# Patient Record
Sex: Male | Born: 1950 | Race: Black or African American | Hispanic: No | Smoking: Former smoker
Health system: Southern US, Community
[De-identification: ages and names within clinical notes are randomized; demographics above are authoritative.]

## PROBLEM LIST (undated history)

## (undated) DIAGNOSIS — N400 Enlarged prostate without lower urinary tract symptoms: Secondary | ICD-10-CM

## (undated) DIAGNOSIS — Z789 Other specified health status: Secondary | ICD-10-CM

## (undated) DIAGNOSIS — M1990 Unspecified osteoarthritis, unspecified site: Secondary | ICD-10-CM

## (undated) DIAGNOSIS — J189 Pneumonia, unspecified organism: Secondary | ICD-10-CM

## (undated) DIAGNOSIS — F32A Depression, unspecified: Secondary | ICD-10-CM

## (undated) DIAGNOSIS — I2510 Atherosclerotic heart disease of native coronary artery without angina pectoris: Secondary | ICD-10-CM

## (undated) DIAGNOSIS — I6522 Occlusion and stenosis of left carotid artery: Secondary | ICD-10-CM

## (undated) DIAGNOSIS — I219 Acute myocardial infarction, unspecified: Secondary | ICD-10-CM

## (undated) DIAGNOSIS — R7303 Prediabetes: Secondary | ICD-10-CM

## (undated) DIAGNOSIS — E119 Type 2 diabetes mellitus without complications: Secondary | ICD-10-CM

## (undated) DIAGNOSIS — C801 Malignant (primary) neoplasm, unspecified: Secondary | ICD-10-CM

## (undated) HISTORY — DX: Other specified health status: Z78.9

## (undated) HISTORY — PX: TONSILLECTOMY: SUR1361

## (undated) HISTORY — PX: APPENDECTOMY: SHX54

## (undated) HISTORY — PX: COLONOSCOPY: SHX174

## (undated) HISTORY — DX: Atherosclerotic heart disease of native coronary artery without angina pectoris: I25.10

## (undated) HISTORY — DX: Acute myocardial infarction, unspecified: I21.9

## (undated) HISTORY — DX: Occlusion and stenosis of left carotid artery: I65.22

## (undated) HISTORY — PX: CATARACT EXTRACTION: SUR2

## (undated) NOTE — Progress Notes (Signed)
 Formatting of this note is different from the original. Clinical Progress Note:   Chief complaint: CHEST PAIN  HPI: John Spencer is a 6 YO male here for Ongoing left and mid chest discomfort. Associated with left hand tingling. Sometimes together, sometimes not.  Baker. movng the flour exacerbated it so not sure if muscle related. This pain is not associated with exercise or food.  Played tennis last Saturday but not since (for a week). Energy level diminished from the past. No chest pain while playing. No assd SOB. No dizziness or sweating.  hx recent cath w stent placement, neg treadmill stress (at submax HR), and normal Echo all in past few months. Taking anti-platelets diligently  Not clearly food related though has not paid attention. Does drink at least one coffee a day and eat a lot of tomatoes and onions. Some stress. When delving further describes stress level as 'very high.' Ref to AOQ at end of this note. PHQ 16 including suicidal thoughts though denies plan to act on this. Hx severe depression and has gone through multiple treatments and now off meds.  Review of Systems:  Constitutional:  negative for fever, negative for chills or recent illness Genitourinary: negative for dysuria, negative for frequency and negative for urgency  Patient Active Problem List:    URTICARIA   ROSACEA   LOWER URINARY TRACT SYMPTOMS (LUTS)   MAJOR DEPRESSION, RECURRENT   INTERMITTENT ASTHMA   PROSTATE CANCER   HYPERLIPIDEMIA   IRRITABLE BOWEL SYNDROME   ATHEROSCLEROSIS OF AORTA   SHOULDER INTERNAL IMPINGEMENT   CAD, PRESENCE OF STENT  No past surgical history on file.  Allergies: Review of patient's allergies indicates no known allergies.  I reviewed the medication list.  Outpatient Prescriptions Marked as Taking for the 06/05/13 encounter (Office Visit) with Ellard Never (M.D.)  Medication Sig Dispense Refill  ? Atorvastatin (LIPITOR) 80 mg Oral Tab Take 1 tablet orally daily for  cholesterol  90  3  ? Aspirin  (ASPIR-LOW) 81 mg Oral TBEC DR Tab Take 1 tablet orally daily for stent protection and heart disease Prescribed by: Traci Marcrum  120  1  ? Clopidogrel (PLAVIX) 75 mg Oral Tab Take 1 tablet orally daily for stent protection Prescribed by: Traci Marcrum  90  5  ? Metoprolol Tartrate (LOPRESSOR) 25 mg Oral Tab Take 1/2 tablet orally 2 times a day for blood pressure and for heart disease Prescribed by: Wilbert Marcrum  100  5   I have reviewed John Spencer: medical history with no changes  06/05/2013 and social history with no changes  06/05/2013   Physical Examination:  BP 130/64  Pulse 59  Temp(Src) 98.5 F (36.9 C) (Temporal)  Ht 5' 11  Wt 173 lb 1.6 oz (78.518 kg)  BMI 24.15 kg/m2  SpO2 98%  General appearance -  vital signs reviewed and alert, well appearing, and in no distress ENT -  mouth mucous membranes moist Neck - supple, no jvd Respiratory - clear to auscultation, no wheezes, rales or rhonchi Cardiovascular -  normal rate and regular rhythm, extra beats, normal S1, S2 Extremities - peripheral pulses normal, no pedal edema Psych -intermittently tearful  Data Reviewed: Reviewed lab results:  Reviewed radiology results: cards procedures  Reviewed other records: chart notes   EKG: unchanged NSR frequent Pacs, e/o old anteroseptal infarct Assessment & Plan:  CHEST PAIN  (primary encounter diagnosis) Doubting cardiac given all the workup that has been done recently and no association with activity but will check ekg  esp to r/o concerns of in stent thrombosis relating to current symptoms though doubtful with this subacute presentation.  More likely psych related CP based on my single visit w him but will also get in touch w cardiology to review and see if any further management indicated. F/u w PCP  CAD, PRESENCE OF STENT Plan: ELECTROCARDIOGRAM, ROUTINE, W AT LEAST 12 LEADS, INTERP AND RPT Continue meds including 2 antiplatelet  MAJOR DEPRESSION,  RECURRENT Psych re-referral placed in hopes of getting earlier appointment. phq 16 Er precautions given Support provided Does not want to resume meds.   FOLLOW UP:PCP  A translator was not needed or used during this encounter  The plan was formulated with the patient who agreed to the plan. The patient was given the opportunity to ask questions and have concerns addressed. The patient has been encouraged to contact me if further questions/concerns arise.   The patient was instructed to contact us , return, seek medical care upon an emergency basis as needed if symptoms fail to improve, worsen, or new/concerning symptoms appear.  Malvin Bonito, MD Primary Care Physician Department of Internal Medicine  ============================================= Adult Outcomes Questionnaire Results  1. Little interest or pleasure in doing things: 0 2. Feeling down, depressed, or hopeless: 3 3. Trouble falling or staying asleep, or sleeping too much: 3 4. Feeling tired or having little energy: 2 5. Poor appetite or overeating: 0 6. Feeling bad about yourself - or that you are a failure or have let yourself or your family down: 3 7. Trouble concentrating on things, such as reading the newspaper or watching television: 2 8. Moving or speaking so slowly that other people could have noticed. Or the opposite - being so fidgety or restless that you have been moving around a lot more than usual: 0 9. Thoughts that you would be better off dead, or of hurting yourself in some way: 3  PHQ9 subscale score: 16 Depression severity:  Moderately severe  Electronically signed by Bonito Malvin (M.D.) at 06/05/2013 10:14 PM PDT

## (undated) NOTE — ED Provider Notes (Signed)
 Formatting of this note is different from the original. Emergency Department Note Date: 03/17/2014 Patient Encounter Initiated at 12:18 PM  Chief Complaint:  Chief Complaint  Patient presents with  ? DIZZY    Pt reports approx 20 minutes ago felling like I was drunk. Pt reports + nausea, SOB, lightheaded with slight chest discomfort.  Denies HA, vomiting at this time.    Per my review of available medical records:  Had ETT 03/13/14: Exercise Treadmill Test:  Risks, benefits, and alternatives to the procedure were discussed with the patient and/or legal representative. All questions were answered. The patient and/or legal representative agreed to proceed with the procedure, and informed consent was signed and is in patient's chart.  DAKARRI KESSINGER is a 28 Y male, who exercised 7:01 mins, on 2 minute stages of Bruce treadmill protocol.  Attained 12.8 METs, with good functional capacity for age. Max HR 143 ( 89% MPHR for age.) Stopped for max effort. No c/p, ST abnormality, arrhythmia except intermitt PVCs.   Impression: ETT low risk for ischemia with good workload. Recommend: contin CAD Rx and CAD risk factor modification, including avoid all tobacco.  MD notified. F/U MD as indicated, and prn further symptoms. JONELLE Fetch, MD  Hx per patient, a good  historian.  HPI: JARREL KNOKE is a 17 Y male with hx CAD, HLD, Depression, Prostate CA, now with c/o lighhteadedness this am. Pt states he has been feeling well, today [place] to [employer], got out of car and was walking to escalator and felt very lightheaded, had to hold himself up at top. Still feels lightheaded lying in gurney. No headache. NO chest pain before arrival, but had brief chest pressure while in triage lasting minutes.  No sob. + nausea, no vomiting.  No headache. NO focal weakness.  Denies vertigo, felt more lightheaded.   Has been off all meds (intentionally, is taking natural equivalents) x months.       All of the [facility]SE,  nurses notes, vital signs, medications, & allergies have been reviewed  All: Review of patient's allergies indicates no known allergies.  PMH: Active Ambulatory Problems    Diagnosis Date Noted  ? URTICARIA 01/24/2005  ? ROSACEA 09/22/2007  ? LOWER URINARY TRACT SYMPTOMS (LUTS) 10/27/2007  ? MAJOR DEPRESSION, RECURRENT 01/04/2008  ? INTERMITTENT ASTHMA 02/05/2009  ? PROSTATE CANCER 06/19/2009  ? HYPERLIPIDEMIA 10/21/2009  ? IRRITABLE BOWEL SYNDROME 06/25/2011  ? ATHEROSCLEROSIS OF AORTA 06/25/2011  ? SHOULDER INTERNAL IMPINGEMENT 07/13/2011  ? CAD, PRESENCE OF STENT 01/18/2013   No Additional Past Medical History   PSH: No past surgical history on file.  Social History: History   Social History  ? Marital Status: Divorced    Spouse Name: N/A    Number of Children: N/A  ? 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 Smoking status: Former Smoker -- 0.50 packs/day for 2 years    Types: Cigarettes    Quit date: 03/17/1976  ? Smokeless tobacco: Never Used     Comment: smokes MJ several times a week  ? Alcohol Use: No  ? Drug Use: Yes  ? Sexual Activity:    Partners: Female     Comment: marijuana   Other Topics Concern  ? Not on file   Social History Narrative   Living arrangements - the patient lives alone.     Review of Systems  Constitutional: (-) fever, (-)chills.  Skin: (-)rash.  HENT:(-)headaches, (-)  ear pain, (-) congestion.   Eyes:(-) eye pain , (-) eye redness.  Cardiovascular:(+) chest pain. (-) orthopnea, (-) paroxysmal nocturnal dyspnea (-) diaphoresis Respiratory:(-) cough , (-) shortness of breath. Gastrointestinal:(+) nausea , (-) vomiting.  Genitourinary:(-) dysuria, (-) urgency, (-) frequency, (-) hematuria.  Neurological:(-) sensory change, (-) focal weakness, (-) loss of consciousness.  All other systems reviewed and are negative.  Physical Exam BP 145/72 mmHg  Pulse 83  Temp(Src) 98.7 F  (37.1 C)  Resp 18  SpO2 98% Vital signs: reviewed,  General: vital signs reviewed and alert, well appearing, and in no distress Head: Normocephalic and atraumatic.  Nose: No nasal deformity. No epistaxis.  Mouth/Throat: Uvula is midline, oropharynx is clear and mucous membranes are normal.  Eyes: Conjunctivae are normal. Pupils are equal, round, and reactive to light.  Neck: No tracheal deviation present. No thyromegaly present.  Cardiovascular:  Normal rate and regular rhythm.  Exam reveals no friction rub.   No murmur heard. Pulmonary/Chest: Effort normal and breath sounds equal bilateral. No wheezing or crackles Abdominal: Soft. No abdominal distension. No abdo tenderness on exam.. There is no splenomegaly or hepatomegaly.  Musculoskeletal: No LE edema, no calf tenderness.  Neurological exam: CN2-12 intact and equal bilaterally. No pronator drift. Nl finger to nose. Strength 5/5 in UE and LE.  Alert and oriented to person, place, and time. Moving all extremities, sensation intact to light touch over all extremities.   Skin: Skin is warm and dry.  Psychiatric:  Normal mood and affect. Judgment normal  Medical Decision Making: 46 M hx CAD, HLD, Depression, Prostate CA, now with c/o lighhteadedness this am. Pt well appearing on exam. Ddx includes cardiac etiology given hx though recent ETT neg and cp only lasted minutes v orthostatic though no recent position change. Pt may be mildly dehydrated. Will r/o UTI. Considered vasovagal.   Initial plan and interventions: labs, d/w cards  Selected Results: All lab tests reviewed Chem nl Trop neg (given low suspicion, will not cycle) Cbc neg Urinalysis neg  EKG: (my reading) normal sinus rhythm, Rate- 75 bpm, QRS- Normal, occ pVC, ST/T segments- Normal. This EKG is similar to prior EKG dated 03/13/14.SABRA EKG #2: + pVC otherwise unchanged.   CXR: neg per radiology.   ED Course:  2:02 PM D/w Dr. Berenice, cards. Pt should be off beta  blocker, has resting HR. Recs: -min asa, statin No need for further testing at this time.   D/w pt above recs and he agrees. Will restart ASA, and statin. Pt taking other natural meds, will f/u with PCP when he has list to review for possible side effects.  S/p NS 1L Able to ambulate in ED w/o difficulty.   Patient Vitals for the past 4 hrs:  Temp Temp Source Pulse BP Resp SpO2 O2 Delivery  03/17/14 1515 - - 57 132/57 mmHg 19 97 % RA-ROOM AIR  03/17/14 1453 - - 61 126/61 mmHg 16 98 % RA-ROOM AIR  03/17/14 1338 - - 80 130/61 mmHg 13 98 % RA-ROOM AIR  03/17/14 1245 - - 66 146/63 mmHg 15 96 % RA-ROOM AIR  03/17/14 1209 98.7 F (37.1 C) ORAL 83 145/72 mmHg 18 98 % RA-ROOM AIR   Impression and Plan:  Lightheadedness (Unclear exact etiology) -f/u PMD in 2 days for re-check and to review meds.   Final disposition: Home with Friend.  Condition at Discharge: Good  Is this patient being admitted with a possible source of infection: No  Electronically signed by Sadoun, Tania (M.D.) at 03/17/2014  3:41 PM PST

## (undated) NOTE — Progress Notes (Signed)
 Formatting of this note might be different from the original.        A lot of baseline artifact but no evidence of ischemia at 12.8 mets Electronically signed by Lenon Jerel Bradley (M.D.) at 02/11/2009 12:33 PM PST

## (undated) NOTE — Progress Notes (Signed)
 Formatting of this note is different from the original. Chief Complaint: ABNORMAL TREADMILL  PI:John Spencer is a 70 Y male  Reason for Visit: Referred from exercise treadmill test for an abnormal test earlier today, following episodes of chest discomfort at home.   Investigations: 12/15/12 exercise treadmill test - symptomatically positive   Procedures: None   Admissions: None   History of the Present Illness: 12/15/12  Per resident Dr. Amado Bumpers:  Has been having off/on lightheadedness x several months. Two days ago, was very lightheaded after waking up. Seen in clinic on 12/01/12 with chest discomfort. Felt a sharp jab over left chest during activities (lifting things, picking up something), one time pushed hands against each other and felt the sharp pain. Would go away when stop activities. No trauma. EKG was done showing q waves in V2-V3. Prescribed nitroglycerin, but has not needed to use it. Since visit, no more such pain. Was told in the past in [place] that he has had a heart attack (8-9 years ago).   Baseline: plays tennis 1-3x a week, but not consistently the last few months due to low energy. The last tennis session was <1 week ago, felt fatigue but no chest discomfort, slight lightheadedness.  Here for EKG stress test today. Feeling fine, just tired, prior to test. A few minutes into the test, felt chest heaviness and lightheadedness. Not the kind of chest pain 2 weeks ago.  Nonsmoker, smokes marijuana (every day). No alcohol. Father had three heart attacks, started in his 65s. Older brother had stents placed in his early 13s.   ROS: no fevers or chills; no shortness of breath, LLQ abdominal pain has been going on for months (has been worked up), no LE edema, no orthopnea.  My note: History reviewed and I agree with findings.   Major Comorbidities:IBS, prostate cancer  BP Readings from Last 3 Encounters:  12/15/12 129/69  12/05/12 111/58  12/01/12 120/41   Pulse  Readings from Last 3 Encounters:  12/15/12 63  12/05/12 63  12/01/12 56   Wt Readings from Last 3 Encounters:  12/15/12 168 lb (76.204 kg)  12/05/12 170 lb 12.8 oz (77.474 kg)  12/01/12 170 lb 3.2 oz (77.202 kg)   Basename Value Date/Time  ? CHOLESTEROL       188 08/24/2012  ? TRIGLYCERIDE         90 10/17/2007  ? HDL                46 08/24/2012  ? LDL CALCULATED      116 10/17/2007   Basename Value Date/Time  ? CREATININE   0.93 12/02/2012   Basename Value Date/Time  ? AST     14 12/02/2012   Basename Value Date/Time  ? WBC COUNT         7.0 12/02/2012  ? HGB              14.6 12/02/2012  ? HEMATOCRIT       44.0 12/02/2012  ? PLATELET COUNT    277 12/02/2012   Past medical History:  Patient Active Problem List:    URTICARIA   ROSACEA   LOWER URINARY TRACT SYMPTOMS (LUTS)   DEPRESSION, MAJOR, RECURRENT   ASTHMA, INTERMITTENT   PROSTATE CANCER   HYPERLIPIDEMIA   IRRITABLE BOWEL SYNDROME   ATHEROSCLEROSIS AORTA   SHOULDER INTERNAL IMPINGEMENT  Medications: Outpatient Prescriptions Marked as Taking for the 12/15/12 encounter (Office Visit) with Berenice Redell Karst (M.D.)  Medication Sig Dispense Refill  ?  Omeprazole Magnesium 20 mg Oral CPDR SR Cap Take 1 capsule orally daily 30 minutes before breakfast  28  2  ? Ondansetron  (ZOFRAN  ODT) 4 mg Oral Rap Dis Tab Take 1 to 2 tablets orally every 6 hours when needed for vomiting or abdominal cramps  30  0   No Facility-Administered Medications for the 12/15/12 encounter (Office Visit) with Berenice Redell Karst (M.D.).   Allergies: No Known Allergies  Social History: History  Substance Use Topics  ? Smoking status: Former Smoker -- 0.50 packs/day for 2 years    Types: Cigarettes    Quit date: 03/17/1976  ? Smokeless tobacco: Never Used  ? Alcohol Use: None   Family History: Father with MIs x3, brother with stents in his 57s  Review of Systems: Constitutional: No recent chills, fevers, weight loss Eyes: No  recent redness Ears Nose Throat: No recent sore throat Cardiovascular: Denies syncope, presyncope, palpitations, orthopnea or PND Respiratory: Denies cough, wheeze or sputum Gastrointestinal: denies anorexia, vomiting, dysphagia, heartburn, recent constipation or diarrhea, blood in bowel motions  Genitourinary: Denies dysuria, hematuria,  Musculoskeletal: Denies diffuse myalgia, focal joint swelling or inflammation.  Skin:Denies recent skin rash Neurological: Denies recent headache, focal arm or leg weakness, diplopia, dysarthria Psychiatric: Denies depressed mood   Physical Exam: BP 129/69  Pulse 63  Temp(Src) 98.8 F (37.1 C) (Oral)  Ht 5' 11  Wt 168 lb (76.204 kg)  BMI 23.44 kg/m2  SpO2 98% Standing BP: left arm 120/70 mm Hg regular; right arm 122/68 mm Hg; regular cuff Const: clear History            Resp: Clear to percussion and auscultation   CV: Normal apex beat. Normal S1, S2. No S3, S4. No murmurs. Normal JVP. Normal carotids. No peripheral edema. Normal foot pulses. No femoral, carotid or abdominal aortic bruits.   Chest: no deformities  GI: No masses, tenderness, bruits  Data Review: Reviewed exercise treadmill test from today  Assessment / Plan:  Atypical chest pain.  His recent sharp, brief, focal left anterior thoracic chest pain was musculoskeletal in character. We have reassured him about this pain and that he does not need to come to Emergency Department for this. However, his chest discomfort during exercise treadmill test today was much more typical of angina, onset within 4 minutes of starting accelerated Bruce protocol, and associated with an increase in venticular ectopics . We are concerned that this represents new onset angina with intermediate risk of future cardiac event and have therefore arranged cardiac catheterization, started aspirin  and atorvastatin, nitroglycerin prn, and cautioned him to come to Emergency Department for more than 10 minutes of this  kind of chest discomfort. Cardiac catheterization booked Thursday 12/22/12 Dr Edwin, [facility], to arrive at 6 AM. IN sinus, with no valve lesions, left ventricular failure, right ventricular failure, peripheral vascular disease. Labs and EKG ordered. Of note, I have explained to him that his dizziness, which occurred today with the chest discomfort and has occurred in the past, may not be related to coronary artery disease and may need further investigation. Given printed catheterization info, link to EMMI.   The total visit time face to face with the patient was over 50 minutes. Time spent in counseling and discussion with the patient was over 20 minutes. Topics discussed as noted above.  Redell Berenice, [facility] Pager: [phone].   Electronically signed by Berenice Redell Karst (M.D.) at 12/15/2012  5:34 PM PDT

## (undated) NOTE — Nursing Note (Signed)
 Formatting of this note might be different from the original. Resumed care of patient from [facility] after lunch relief.  Pt. Denies CP, SOB or dizziness.  Awaiting call for stress test.  Pt. Aware of reason for delay. Electronically signed by Lennie Ronal Cy Mariana (R.N.) at 03/13/2014 12:54 PM PST

## (undated) NOTE — Nursing Note (Signed)
 Formatting of this note might be different from the original. Call made to EKG tech requesting ETA on patient test and tech states it will be between 12:30-1:30 pm.  Pt. Updated on plan of care.   Electronically signed by Lennie Ronal Cy Mariana (R.N.) at 03/13/2014  1:37 PM PST

## (undated) NOTE — ED Provider Notes (Signed)
 Formatting of this note is different from the original. ED note cc:  Chief Complaint  Patient presents with  ? ARM NUMBNESS    x 3 days and not resolving. Denies any weakness. Negative BE FAST at triage or any injury. +SI, for years - denies any plan, never acted on a plan.   ? CHEST DISCOMFORT    sternal itching and soreness to touch with ache intermittent for the last week. Denies any lightheadedness or n/v   PCP: BOBBETTA LEVAN MD  HPI: John Spencer is a 4 Y male says that he has left arm numbness since 7 pm tonight. Also with chest pain constantly for the past three days. He says that he was SOB when moving furniture. Got 4 stents for CAD 2 years ago and stopped taking plavix one year ago. Is not taking any meds and is not taking ASA either. Says he does herbal medications. Says that he never had CP in the past when he needed stents, but only had SOB.   All of the MSE, nurses notes, cips history, vital signs, medications, and allergies have been reviewed. Old records reviewed.  Allergies: Review of patient's allergies indicates no known allergies. Problem list/PMH: Patient Active Problem List:    URTICARIA   ROSACEA   LOWER URINARY TRACT SYMPTOMS (LUTS)   MAJOR DEPRESSION, RECURRENT   INTERMITTENT ASTHMA   PROSTATE CANCER   HYPERLIPIDEMIA   IRRITABLE BOWEL SYNDROME   ATHEROSCLEROSIS OF AORTA   SHOULDER INTERNAL IMPINGEMENT   CAD, PRESENCE OF STENT  Active Ambulatory Problems    Diagnosis Date Noted  ? URTICARIA 01/24/2005  ? ROSACEA 09/22/2007  ? LOWER URINARY TRACT SYMPTOMS 10/27/2007  ? MAJOR DEPRESSION, RECURRENT 01/04/2008  ? INTERMITTENT ASTHMA 02/05/2009  ? PROSTATE CANCER 06/19/2009  ? HYPERLIPIDEMIA 10/21/2009  ? IRRITABLE BOWEL SYNDROME 06/25/2011  ? ATHEROSCLEROSIS OF AORTA 06/25/2011  ? SHOULDER INTERNAL IMPINGEMENT. 07/13/2011  ? CAD, PRESENCE OF STENT 01/18/2013   No Additional Past Medical History   meds: No outpatient prescriptions have been  marked as taking for the 07/09/15 encounter [facility] Encounter).   Social History   Social History  ? 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 Smoking status: Former Smoker -- 0.50 packs/day for 2 years    Types: Cigarettes    Quit date: 03/17/1976  ? Smokeless tobacco: Never Used     Comment: smokes MJ several times a week  ? Alcohol Use: No  ? Drug Use: Yes  ? Sexual Activity:    Partners: Female     Comment: marijuana   Social History Narrative   Living arrangements - the patient lives alone.   04/02/2015   3 grandchildren, 22,18 and 1yo (in [place] )      Review of Systems  All other systems reviewed and are negative.  Vitals: BP 125/73 mmHg  Pulse 84  Temp(Src) 98.7 F (37.1 C)  Resp 16  Wt 80.3 kg (177 lb 0.5 oz)  SpO2 96%  Physical Exam  Constitutional: He is oriented to person, place, and time. He appears well-developed and well-nourished. No distress.  HENT:  Head: Normocephalic and atraumatic.  Eyes: EOM are normal. Pupils are equal, round, and reactive to light.  Neck: Neck supple. No thyromegaly present.  Cardiovascular: Normal rate, regular rhythm, normal heart sounds and intact distal pulses.   Pulmonary/Chest: Effort normal and breath sounds normal. No respiratory distress. He has no wheezes. He has no  rales. He exhibits no tenderness.  Abdominal: Soft. Bowel sounds are normal.   Musculoskeletal: Normal range of motion. He exhibits no edema or tenderness.  Neurological: He is alert and oriented to person, place, and time. No cranial nerve deficit.  Skin: Skin is warm and dry. He is not diaphoretic.  Psychiatric: He has a normal mood and affect. His behavior is normal.   Medical Decision making/ed course:  55 year old male with male with chest pain and SOB and hx of CAD with stents 2 years ago. Differential also includes (but is not limited to): cardiac (acute coronary syndrome, stable angina,  pericarditis, myocarditis, aortic stenosis), musculoskeletal chest pain (eg, costochondritis, rheumatic disease, rib fracture), vascular (aortic dissection, acute chest), pulmonary (pulmonary embolism, pneumonia, pleuritis, pulmonary hypertension, pneumothorax, asthma/copd, pleural effusion), dermatologic (zoster), drug-induced (meth, cocaine) , GI (GERD, achalasia, esophageal rupture or foreign body, biliary), mediastinitis/pneumomediastinum, psychogenic, intraabdominal (eg, diaphragm inflammation)  labs: Recent Results (from the past 12 hour(s))  CHEM 7 (NA, K, CL, CO2, BUN, GLUC, CR)   Collection Time: 07/09/15 11:25 PM  Result Value Ref Range   Sodium 138 135 - 145 mEq/L   Potassium 3.5 3.5 - 5.3 mEq/L   Chloride 102 100 - 111 mEq/L   CO2 28 24 - 33 mEq/L   Anion gap, ser/plas 8 5 - 16 mEq/L   BUN 16 7 - 27 mg/dL   GLUCOSE, RANDOM 891 60 - 159 mg/dL   Creatinine 9.02 <=8.65 mg/dL   Glomerular filtration rate, nonAfrican American >60 >=60 mL/min   GLOMERULAR FILTRATION RATE - AFRICAN AMERICAN >60 >=60 mL/min   Comment, glomerular filtration rate SEE NOTE   TROPONIN I   Collection Time: 07/09/15 11:25 PM  Result Value Ref Range   Troponin I <0.02 0.00 - 0.04 ng/mL   TROPONIN I INTERPRETATION SEE NOTE   CBC W AUTOMATED DIFFERENTIAL   Collection Time: 07/09/15 11:25 PM  Result Value Ref Range   WBC COUNT 10.1 3.5 - 12.5 K/uL   Red blood cells count 4.72 4.10 - 5.70 M/uL   Hgb 14.9 13.0 - 17.0 g/dL   Hematocrit 56.5 60.9 - 51.0 %   MCV 92 80 - 100 fL   RDW, RBC 12.7 12.0 - 16.5 %   RBC's, nucleated 0 <=0 /100WC   Platelets count 239 140 - 400 K/uL   Differential panel AUTO DIF   WBC AUTOMATED DIFFERENTIAL   Collection Time: 07/09/15 11:25 PM  Result Value Ref Range   Neutrophils %, automated count 60 41 - 79 %   Lymphocytes %, automated count 30 13 - 44 %   Monos %, auto 6 5 - 14 %   Eosinophils %, automated count 3 0 - 6 %   Basophils %, automated count 0 0 - 2 %    Neutrophils auto count 6.1 2.1 - 7.4 K/uL   12 LEAD EKG as read by me: NSR rate 95 bpm with LAFB and similar to prior ECG CXR as read by me: NAD  The patient has possible cardiac chest pain and meets ALL of the following criteria:  1. History is not consistent with classic angina.  2. No new ischemic EKG findings.  3. Negative troponin (< 0.05). Based on local guidelines (link), which utilize the HEART Score when applicable, the patient has a Heart Score > 3, in addition to the low risk criteria above.  Therefore, will arrange for a stress test within 24-48 hours.  0100 will get second trop at 0200 if  neg will dc home with outpatient MPS study.  Impression/diagnosis: Chest pain  I have confirmed the presence of the above clinical diagnoses, which were considered in the current and ongoing care of the patient. At the time of this visit, the medical record indicates, and/or the patient states, that there are no changes in these conditions unless otherwise noted. As treatment warrants, the patient has been advised to follow up with his PCP or appropriate specialist.   follow up: pending disposition: pending  condition at discharge: pending  Cleanup and/or reconciliation of medications was performed, and appropriately-updated medication list (including medications started today if any, meds to continue if any, and meds to discontinue if any; and also including all essential information ie. name, dose, frequency of each medicine) was recorded in the medical record, and if applicable, explained and distributed in accordance with protocol/policy (unless patient unable to cooperate, or insufficient information to proceed, or technical/printing difficulties were encountered.)     Electronically signed by Peg Gray A (D.O.) at 07/10/2015  1:01 AM PDT

## (undated) NOTE — Progress Notes (Signed)
 Formatting of this note is different from the original. Chief Complaint: No chief complaint on file.  PI:John Spencer is a 56 Y male referred from exercise treadmill test for an abnormal test earlier today, following episodes of chest discomfort at home. Catheterization 01/18/13 showed lesions in LAD and right coronary artery, each treated with two DES (Resolute) stents.   Investigations: 12/15/12 exercise treadmill test - symptomatically positive   Procedures: None   Admissions: None   History of the Present Illness: 12/15/12  Per resident Dr. Amado Bumpers:  Has been having off/on lightheadedness x several months. Two days ago, was very lightheaded after waking up. Seen in clinic on 12/01/12 with chest discomfort. Felt a sharp jab over left chest during activities (lifting things, picking up something), one time pushed hands against each other and felt the sharp pain. Would go away when stop activities. No trauma. EKG was done showing q waves in V2-V3. Prescribed nitroglycerin, but has not needed to use it. Since visit, no more such pain. Was told in the past in [place] that he has had a heart attack (8-9 years ago).   Baseline: plays tennis 1-3x a week, but not consistently the last few months due to low energy. The last tennis session was <1 week ago, felt fatigue but no chest discomfort, slight lightheadedness.  Here for EKG stress test today. Feeling fine, just tired, prior to test. A few minutes into the test, felt chest heaviness and lightheadedness. Not the kind of chest pain 2 weeks ago.  Nonsmoker, smokes marijuana (every day). No alcohol. Father had three heart attacks, started in his 68s. Older brother had stents placed in his early 78s.   ROS: no fevers or chills; no shortness of breath, LLQ abdominal pain has been going on for months (has been worked up), no LE edema, no orthopnea.  My note: History reviewed and I agree with findings.   Major Comorbidities:IBS, prostate  cancer  Physical Exam: There were no vitals taken for this visit. Standing BP: left arm 120/70 mm Hg regular; right arm 122/68 mm Hg; regular cuff Const: clear History            Resp: Clear to percussion and auscultation   CV: Normal apex beat. Normal S1, S2. No S3, S4. No murmurs. Normal JVP. Normal carotids. No peripheral edema. Normal foot pulses. No femoral, carotid or abdominal aortic bruits.   Chest: no deformities  GI: No masses, tenderness, bruits  Data Review: Reviewed exercise treadmill test from today  Assessment / Plan:  Atypical chest pain.  His recent sharp, brief, focal left anterior thoracic chest pain was musculoskeletal in character. We have reassured him about this pain and that he does not need to come to Emergency Department for this. However, his chest discomfort during exercise treadmill test today was much more typical of angina, onset within 4 minutes of starting accelerated Bruce protocol, and associated with an increase in venticular ectopics . We are concerned that this represents new onset angina with intermediate risk of future cardiac event and have therefore arranged cardiac catheterization, started aspirin  and atorvastatin, nitroglycerin prn, and cautioned him to come to Emergency Department for more than 10 minutes of this kind of chest discomfort. Cardiac catheterization booked Thursday 12/22/12 Dr Edwin, [facility], to arrive at 6 AM. IN sinus, with no valve lesions, left ventricular failure, right ventricular failure, peripheral vascular disease. Labs and EKG ordered. Of note, I have explained to him that his dizziness, which occurred today with  the chest discomfort and has occurred in the past, may not be related to coronary artery disease and may need further investigation. Given printed catheterization info, link to EMMI.   History of the Present Illness: 02/07/13 Has been taking it easy since PCI and has not played tennis. Working fulltime and one  episode of a pinching chest pain and subsequent fatigue this past weekend and one episode of pinching headache right side. 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 Any recent chest discomfort was different from that prior to PCI. Initial feeling of dizziness post PCI has abated.   BP Readings from Last 3 Encounters:  01/19/13 116/52  12/15/12 129/69  12/05/12 111/58   Pulse Readings from Last 3 Encounters:  01/19/13 48  12/15/12 63  12/05/12 63   Wt Readings from Last 3 Encounters:  01/18/13 163 lb 9.3 oz (74.2 kg)  12/15/12 168 lb (76.204 kg)  12/05/12 170 lb 12.8 oz (77.474 kg)   Basename Value Date/Time  ? CHOLESTEROL       188 08/24/2012  ? TRIGLYCERIDE         90 10/17/2007  ? HDL                46 08/24/2012  ? LDL CALCULATED      116 10/17/2007   Basename Value Date/Time  ? CREATININE   0.85 01/19/2013   Basename Value Date/Time  ? AST     14 12/02/2012   Basename Value Date/Time  ? WBC COUNT         7.2 01/19/2013  ? HGB              14.2 01/19/2013  ? HEMATOCRIT       41.8 01/19/2013  ? PLATELET COUNT    196 01/19/2013   Past medical History:  Patient Active Problem List:    URTICARIA   ROSACEA   LOWER URINARY TRACT SYMPTOMS (LUTS)   MAJOR DEPRESSION, RECURRENT   INTERMITTENT ASTHMA   PROSTATE CANCER   HYPERLIPIDEMIA   IRRITABLE BOWEL SYNDROME   ATHEROSCLEROSIS OF AORTA   SHOULDER INTERNAL IMPINGEMENT   CAD, PRESENCE OF STENT  Medications:  Allergies: No Known Allergies  Social History: History  Substance Use Topics  ? Smoking status: Former Smoker -- 0.50 packs/day for 2 years    Types: Cigarettes    Quit date: 03/17/1976  ? Smokeless tobacco: Never Used     Comment: smokes MJ several times a week  ? Alcohol Use: No   Family History: @[facility]   Review of Systems: Constitutional: No recent chills, fevers, weight loss Eyes: No recent redness Ears Nose Throat: No recent sore throat Cardiovascular: Denies syncope, presyncope,  palpitations, orthopnea or PND Respiratory: Denies cough, wheeze or sputum Gastrointestinal: denies anorexia, vomiting, dysphagia, heartburn, recent constipation or diarrhea, blood in bowel motions  Genitourinary: Denies dysuria, hematuria,  Musculoskeletal: Denies diffuse myalgia, focal joint swelling or inflammation.  Skin:Denies recent skin rash Neurological: Denies recent headache, focal arm or leg weakness, diplopia, dysarthria Psychiatric: Denies depressed mood   Physical Exam: BP 128/74  Pulse 63  Temp(Src) 98.3 F (36.8 C) (Oral)  Ht 5' 11  Wt 172 lb 4.8 oz (78.155 kg)  BMI 24.04 kg/m2  SpO2 98% Pulse regular with frequent ectopics.  Standing BP: left arm - mm Hg; right arm 110/70 mm Hg; normal cuff Const: right radial puncture site normal            Resp: Clear to percussion and auscultation  CV: Normal apex beat. Normal S1, S2. No S3, S4. No murmurs. Normal JVP. Normal carotids. No peripheral edema.   Data Review: 11/11/4: EKG. Sinus with venticular ectopics but no old anterior myocardial infarction.   Assessment / Plan:  CAD, PRESENCE OF STENT  (primary encounter diagnosis) Note: Atypical chest pain post PCI and unlikely to represent angina. exercise treadmill test ordered to confirm and to clear to return to tennis. Echo ordered to measure LVEF. No left ventricular failure or right ventricular failure, no valve lesions. Add atorvastatin 80 mg and follow up by email. Referred to [facility]. Discussed etiology of coronary artery disease, risk factor management, role of medication.  Plan: ATORVASTATIN 80 MG ORAL TAB       TREADMILL STRESS TEST       ELECTROCARDIOGRAM, ROUTINE, W AT LEAST 12 LEADS, INTERP AND RPT  HYPERLIPIDEMIA Note: see above  Plan: ATORVASTATIN 80 MG ORAL TAB       TREADMILL STRESS TEST  INTERMITTENT ASTHMA Note: no increase on metoprolol   The total visit time face to face with the patient was over 30 minutes. Time spent in counseling and  discussion with the patient was over 15 minutes. Topics discussed as noted above.  Redell New, [facility] Cardiology Pager: [phone].   Electronically signed by New Redell Karst (M.D.) at 02/07/2013  4:44 PM PST

## (undated) NOTE — Nursing Note (Signed)
 Formatting of this note might be different from the original. Break relief: Pt is resting in bed with no distress. Talking to family. Will continue to monitor. Electronically signed by Dickey Waylan Brill (R.N.) at 03/17/2014  1:38 PM PST

## (undated) NOTE — ED Notes (Signed)
 Formatting of this note is different from the original. EMERGENCY DEPARTMENT NOTE:    68 Y male in Emergency Department with  Chief Complaint  Patient presents with  ? CHEST DISCOMFORT    L sided CP x 3 days, radiates to L arm, + some dizziness, denies sob.    hx of stent placement.    Hx obtained from patient - a good historian  First encounter at 5:53 AM Reviewed old charts, which reveal:  Cards: DAVEION ROBAR is a 73 Y male referred from exercise treadmill test for an abnormal test earlier today, following episodes of chest discomfort at home. Catheterization 01/18/13 showed lesions in LAD and right coronary artery, each treated with two DES (Resolute) stents.    CHRISTOBAL MORADO is a 70 Y male who presents c/o intermittent chest pain over the past 3 days, sometimes at rest, sometimes with exertion.  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  No significant dyspnea, no diaphoresis, nausea, vomiting, or weakness.   No meds at home (no NTG or asa).  PMHx:  Patient Active Problem List:    URTICARIA   ROSACEA   LOWER URINARY TRACT SYMPTOMS (LUTS)   MAJOR DEPRESSION, RECURRENT   INTERMITTENT ASTHMA   PROSTATE CANCER   HYPERLIPIDEMIA   IRRITABLE BOWEL SYNDROME   ATHEROSCLEROSIS OF AORTA   SHOULDER INTERNAL IMPINGEMENT   CAD, PRESENCE OF STENT  PSHx:  No past surgical history on file.  Meds:lipitor, plavix, asa, ntg   No outpatient prescriptions have been marked as taking for the 03/13/14 encounter [facility] Encounter).   Social history:   Not homeless History  Substance Use Topics  ? Smoking status: Former Smoker -- 0.50 packs/day for 2 years    Types: Cigarettes    Quit date: 03/17/1976  ? Smokeless tobacco: Never Used     Comment: smokes MJ several times a week  ? Alcohol Use: No   Family Hx:  Non-contributory  Allergic:   Review of patient's allergies indicates no known allergies.   ROS:   Const: No fever, chills or sweats  HEENT:  no visual changes, no ear pain, no sore throat  Respiratory: no cough, no shortness of breath  Cardiovascular: no palpitation  Gastrointestinal: No N/V, no abdominal pain, no change in bowel habits, black or bloody stools  Genitourinary: No Dysuria, no urgency,  no frequency  MS:  No gross deformity.  Neurological:  +dizziness, no syncope.  Psychiatric:  no depression, no anxiety  All other review of systems reviewed and are negative.  PHYSICAL EXAM:   Vitals: BP 131/78 mmHg  Pulse 74  Resp 18  SpO2 97% Constitutional:   Well appearing male lying comfortably in bed in NAD, non toxic appearing.  Eyes:  EOMI, normal conjunctiva  HENT:  Atraumatic, normal Oropharynx, oral mucosa, normal external ear Neck:  supple, no gross thyro-megaly.   Respiratory:  normal respiratory effort, CTAB, no wheezing, no rales, no rhonchi  CV:  RRR, no m/r/g.   Abdomen:  Non distended, +BS, soft, no tenderness, no guarding, no rebound.  No significant hepatosplenomegaly palpated.  Back: FROM, no soft-tissue tenderness, no CVAT Musculoskeletal: No pitting edema, nl gait.  Neuro:  normal sensory, motor, normal speech and follows commands appropriately.  Psych:  A&O x3, appropriate affect.  Skin:  no rash, warm and dry   MEDICAL DECISION MAKING:  Patient appears well and VS are benign.   Differential diagnosis includes acs, USA  given rest pain, increasing exertional symptoms.  TAD, PE unlikely with pattern.   Will plan to get basic labs, trop, ekg, cxr and will give ntg, asa   DIAGNOSTICS:  EKG: (read by me) NSR  67, nl axis, nl intervals, J point elevation with peaked TW in anterior leads vs last summer   CXR (2 view as read by me): Negative acute, no change from prior  Labs remarkable for:  Cbc, chem, trop pending.  PROGRESS: 0615: Discussed with Dr Wynona from Cardiology given  somewhat atypical symptoms and changed EKG, requests call back if first trop abnl for likely cath, otherwise if trops negative, may be offered cath.  Will sign out to am doc.  Diagnosis: chest pain  Condition: Stable  Disposition: pending above   BENJAMIN LYNWOOD SCHIFF MD   Electronically signed by SCHIFF Morene LYNWOOD (M.D.) at 03/13/2014  6:34 AM PST

## (undated) NOTE — Procedures (Signed)
 Associated Order(s): TREADMILL CARDIOVASCULAR STRESS TEST. Formatting of this note might be different from the original. Exercise Treadmill Test:  Risks, benefits, and alternatives to the procedure were discussed with the patient and/or legal representative. All questions were answered. The patient and/or legal representative agreed to proceed with the procedure, and informed consent was signed and is in patient's chart.  John Spencer is a 75 Y male, who exercised 7:01 mins, on 2 minute stages of Bruce treadmill protocol.  Attained 12.8 METs, with good functional capacity for age. Max HR 143   ( 89% MPHR for age.)  Stopped for max effort. No c/p, ST abnormality, arrhythmia except intermitt PVCs.    Impression: ETT low  risk for ischemia with good workload. Recommend: contin CAD Rx and  CAD risk factor modification, including avoid all tobacco.  MD notified. F/U  MD as indicated, and prn further symptoms. JONELLE Fetch, MD  Electronically signed by Fetch Asberry Gunner (M.D.) at 03/13/2014  2:32 PM PST

## (undated) NOTE — Nursing Note (Signed)
 Formatting of this note might be different from the original. Resting in bed.   Denies CP, SOB, nausea or dizziness.  Skin warm and dry.  Pt. Updated on plan of care, awaiting Stress test. Electronically signed by Lennie Ronal Cy Mariana (R.N.) at 03/13/2014 10:54 AM PST

## (undated) NOTE — Nursing Note (Signed)
 Formatting of this note might be different from the original. Ambulated to nurses desk requesting time of test.  Call made to EKG tech requesting ETA for treadmill.  Pt. Denies c/o CP, SOB or dyspnea with ambulation.   Electronically signed by Lennie Ronal Cy Mariana (R.N.) at 03/13/2014  1:39 PM PST

## (undated) NOTE — H&P (Signed)
 Formatting of this note might be different from the original. Lab Hours:  - Genetic[facility], molecular Building Lab: located on the 1st  floor in the [facility] [employer] [facility] at [employer].  Open M - F, 7:00 a.m. - 7:00 p.m., Weekends, 7:30 a.m. - 5:00 p.m.   - [facility]: located on the 2nd floor in the [place] [facility]  at [employer]. Open M - F, 8:00 a.m. - 5:00 p.m.   - [facility] Lab: located on  the 1st floor in the [facility] at [address].  Open M - F, 8:00 a.m. - 5:00 p.m.  Electronically signed by Berenice Redell Karst (M.D.) at 12/15/2012  5:34 PM PDT

## (undated) NOTE — ED Notes (Signed)
 Formatting of this note is different from the original. ED ATTENDING ADDENDUM (pt signed out to me)  Patient signed out to me by Dr. Millicent at [facility] pending troponin result. Trop undetectable ecg with some tall twaves anteriorly, similar to prior in 05/2013 Also with frequent PVCs, also previously noted on multiple prior ECGs  Pt states pains have come and gone over the past 3 days, non-exertional, lasting only several minutes at a time. Seems to not happen when he lies on his right side at night and only when on his left side Prior anginal equivalent was mostly fatigue with mild discomfort - does endorse some exertional fatigue when walking up hills for the past few days. Normally very active, otherwise hasn't noticed any exertional sxs.  D/w Dr Doane - given negative trop and atypical sxs would proceed with ETT rather than cath, pt agrees. Will perform this AM given risk profile.  ETT - Impression: ETT low risk for ischemia with good workload. Recommend: contin CAD Rx and CAD risk factor modification, including avoid all tobacco.  MD notified. F/U MD as indicated, and prn further symptoms. JONELLE Fetch, MD*  MDM: chest pain nos - will dc home    ICD-10-CM  1. CHEST PAIN R07.9      Electronically signed by Oneil Celestine Bare (M.D.) at 03/13/2014  2:41 PM PST

## (undated) NOTE — Progress Notes (Signed)
 Formatting of this note is different from the original.  SUBJECTIVE John Spencer is a 89 Y male, Here for  CHEST PAIN (chest discomfort, couple wks, on and off) Went to er on 12/04/2016 Chief Complaint:  DIZZY (feeling nauseated and lighthead since last week. SOB too.  cramping lower abdomen. no dysuria. no BM for days.) HPI:  John Spencer is a 35 Y male who presents reporting 2 weeks of lightheadedness, especially noticeable when getting out of bed and going to the bathroom.  Last week, he noticed that he was unusually short of breath with what should have been minimal exertion while golfing last week.  No dependent edema, orthopnea, or PND.   Patient has h/o stent x4 01/18/2013 LAD and RCA.  He has ongoing twinges fleeting (seconds), sharp pain in the mid chest without radiation, migration, or discernable pattern several times weekly.  These pains are not like the pain that led to the stents. Medical Decision Making:  Patient appears well and VS are normal.  Differential diagnosis includes dehydration, ACS, CHF no reason to suspect occult infection, PE, or TAD.  Will plan to get labs, CXR and will give IVF Reviewed old charts. EKG 1158: NSR 56; no sig Q or acute ST-T changes(; generally similar to old dated 07/12/2015); my read Orthostatics changed SBP 149->109 without change in symptoms CXR: neg per RADS NMP 1. Normal appearing SPECT myocardial perfusion study, unchanged from the 07-10-2015 scan. 2. Gated dynamic acquisition demonstrates normal left ventricular wall motion. The left ventricular ejection fraction calculated as part of the stress imaging acquisition is approximately 53 % which was statistically unchanged from the 55 % calculated on 07-10-2015.  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 Unfortunately his initial read showed no           cancer - on F/U review a small focus of Gl 3+3 was           detected. On expectant management.       INTERMITTENT ASTHMA (J45.20)    MAJOR DEPRESSION, RECURRENT (F33.9)    LOWER URINARY TRACT SYMPTOMS (R39.9)    ROSACEA (L71.9)    URTICARIA (L50.9)  Outpatient Prescriptions Marked as Taking for the 12/08/16 encounter (Office Visit) with Tang, Hai-Tao (M.D.) Medication Sig Dispense Refill ?  Terazosin (HYTRIN) 2 mg Oral Cap Take 1 capsule by mouth daily at bedtime for 1 week. If ineffective, may increase by 1 capsule every week to a maximum of 5 capsules per day 100 capsule 3  Allergy: No Known Allergies  Review of Systems  Constitutional: Negative.   HENT: Negative.   Eyes: Negative.   Respiratory: Negative.   Cardiovascular: Positive for chest pain. Negative for palpitations, orthopnea, claudication, leg swelling and PND.  Gastrointestinal: Negative.   Genitourinary: Negative.   Musculoskeletal: Negative.   Skin: Negative.   Neurological: Negative.   Endo/Heme/Allergies: Negative.   Psychiatric/Behavioral: Negative for depression, hallucinations, memory loss, substance abuse and suicidal ideas. The patient is nervous/anxious. The patient does not have insomnia.    Physical Exam  Constitutional: He is oriented to person, place, and time and well-developed, well-nourished, and in no distress.  HENT:  Head: Normocephalic and atraumatic.  Right  Ear: External ear normal.  Left Ear: External ear normal.  Nose: Nose normal.  Mouth/Throat: Oropharynx is clear and moist.  Eyes: Pupils are equal, round, and reactive to light. EOM are normal.  Neck: Normal range of motion. Neck supple. No JVD present.  Cardiovascular: Normal rate, regular rhythm, normal heart sounds and intact distal pulses.   Pulmonary/Chest: Effort normal and breath sounds normal. No respiratory distress. He has no wheezes. He has no rales. He exhibits no tenderness.  Abdominal: Soft. Bowel sounds are normal.  Musculoskeletal: Normal range of motion. He exhibits no edema, tenderness or deformity.  Lymphadenopathy:    He has no cervical adenopathy.  Neurological: He is alert and oriented to person, place, and time. He has normal reflexes. He displays normal reflexes. No cranial nerve deficit. He exhibits normal muscle tone. Gait normal. Coordination normal. GCS score is 15.  Skin: Skin is warm. No rash noted. No pallor.  Psychiatric: Mood, memory, affect and judgment normal.  Nursing note and vitals reviewed.      BP 109/63   Pulse 69   Temp 98.1 F (36.7 C) (Temporal)   Ht 6'   Wt 83.9 kg (185 lb)   SpO2 96%   BMI 25.09 kg/m   Basename Value Date/Time  ? WBC                          8.6 12/04/2016  ? HCT                         45.0 12/04/2016  ? HGB                         15.3 12/04/2016  ? PLT                          246 12/04/2016  ? CHOL                         203 12/02/2014  ? HDL                           45 12/02/2014  ? LDL CALC                       116 10/17/2007  ? LDL DIRECT  147 04/03/2016  ? TRIG                         206 12/02/2014  ? ALT                           14 04/03/2016  ? AST                           14 09/22/2013  ? BUN                           12 12/04/2016  ? CREAT                       0.92 12/04/2016  ? GFR-AFRAM                    >60 12/04/2016  ? GFR NONAFR AMER              >60 12/04/2016  ?  GFR-NONAFRAM                   >60 02/05/2009  ? K                            4.0 12/04/2016  ? NA                           136 12/04/2016  ? CO2                           25 12/04/2016  ? CL                           103 12/04/2016  ? TSH                         0.93 12/02/2014  ? PSA                          5.8 04/03/2016  ? PSA                          6.2 12/02/2014  ? PSA                          5.8 01/30/2014  ? INR                          1.0 01/30/2014  ? INR                          1.1 01/13/2013  ? INR                          1.1 12/15/2012  ? GLUC FAST                       92 10/17/2007  ? GLUC  97 12/04/2016  ? HGBA1C %                     5.4 04/03/2016  ? ESTIMATED AVERAGE GLUCOSE    108 04/03/2016   Assessment and plan:  CAD, PRESENCE OF STENT  (primary encounter diagnosis) Note: well control. Reassured pt. Pt declines statin. Understood R&Bs Plan: ALT       CREATININE W GFR, CALCULATED       GLUCOSE, FASTING       HEMOGLOBIN A1C       POTASSIUM       TSH       LIPID PANEL, NON-FASTING (CHOL, DHDL, TRIG, CALC LDL W REFLEX TO DLDL)       GLUCOSE       CBC + DIFF (AT REG LAB).       LDL CHOLESTEROL, DIRECT       LIPID PANEL, (CHOL, DHDL, TRIG, CALC LDL), FASTING  ATHEROSCLEROSIS OF AORTA Note: con't bp, lipid control  IRRITABLE BOWEL SYNDROME Note: stable  HYPERLIPIDEMIA Note: not at goal. Pt declines statin. Understood R&Bs  PROSTATE CANCER Note: con't observation Plan: PSA  INTERMITTENT ASTHMA Note: good control. Con't current Tx;  MAJOR DEPRESSIVE DISORDER, RECURRENT EPISODE, MODERATE Note: low si/hi risk Pt declines antidepressant or therapy. Understood R&Bs   Electronically signed by Tang, Hai-Tao (M.D.) at 12/08/2016 12:33 PM PDT

## (undated) NOTE — ED Notes (Signed)
 Formatting of this note is different from the original. Note initiated on 01/16/2019 at 1:25 PM History provided by patient during my interview:  HPI: John Spencer in Emergency Department with chief complaint of chest discomfort.  Patient does have a history of coronary artery disease with stent placement.  Patient states that he went hiking yesterday and developed substernal chest discomfort.  Symptoms were noted at the beginning of her hike, but were not necessarily exertional or pleuritic.  Symptom was mild, continued throughout the day constantly.  Patient was able to go to sleep last night, states that when he woke up today he still noticed it so he figured it should get checked.  Patient denies complaints of chest pain, shortness of breath, nausea, vomiting.  Patient does report several months of fatigue.  PMHx: Patient Active Problem List:    URTICARIA   ROSACEA   LOWER URINARY TRACT SYMPTOMS   INTERMITTENT ASTHMA   PROSTATE CANCER   HYPERLIPIDEMIA   IRRITABLE BOWEL SYNDROME   ATHEROSCLEROSIS OF AORTA   SHOULDER INTERNAL IMPINGEMENT.   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  Social history:   Social History   Tobacco Use  ? Smoking status: Former Smoker    Packs/day: 0.50    Years: 2.00    Pack years: 1.00    Types: Cigarettes    Quit date: 03/17/1976    Years since quitting: 42.8  ? Smokeless tobacco: Never Used  ? Tobacco comment: smokes MJ several times a week  Substance Use Topics  ? Alcohol use: No    Alcohol/week: 0.0 standard drinks  ? Drug use: Yes     Review of Systems:  Const: No fever HEENT:  no visual changes Respiratory: no cough Cardiovascular: no CP Gastrointestinal: no vomiting Genitourinary: No Dysuria MS:  No gross deformity. Neurological:   no dizziness  Psychiatric:  no depression Allergic:  Patient has no known allergies.  Physical exam: BP 160/75   Pulse 64   Temp 99.2 F (37.3 C)   Resp 18   SpO2 98%  Pulse Ox Interpretation: normal Constitutional: Well developed, well nourished, no acute distress, non-toxic appearance  Eyes: PERRL, conjunctiva normal  HENT: Atraumatic, oropharynx clear and moist, unremarkable external ear exam Neck: normal range of motion, no tenderness, supple without meningismus, no gross thyromegaly Respiratory: No respiratory distress, normal breath sounds, no rales, no wheezing, no rhonchi Cardiovascular: Normal rate, normal rhythm, no murmurs, no rubs, symmetric pulses noted radially  GI: Soft, nondistended, nontender, no mass, no rebound, no guarding, HSM not appreciated GU: No costovertebral angle tenderness  Musculoskeletal: No edema, no tenderness, no deformities, no focal joint findings Back: No tenderness at midline Integument: Warm and dry, no rash  Lymphatic: No lymphadenopathy noted  Neurologic: CN 2-12 normal, normal motor function, normal sensory function, no focal deficits noted  Psychiatric: Alert and oriented x 3, appropriate affect   EKG: Sinus rhythm rate of 70 bpm, normal PR/QRS/QTc, frequent PVCs, abnormal, no ST elevation or depression  Medical Decision Making: 89 Y Spencer in Emergency Department with chief complaint of chest discomfort.  Symptoms are atypical in nature, however patient does have history of coronary artery disease.  Patient has had ongoing chest discomfort since last night.  Given duration of symptoms, will check single troponin, if  negative do not feel patient requires further cardiac work-up at this time.  Low suspicion for dissection, PE, pneumothorax.  ED Workup:  Results for orders placed or performed during the hospital encounter of 01/16/19  CHEM 7 (NA, K, CL, CO2, BUN, GLUC, CR)  Result Value Ref Range   Sodium 138 135 - 145 mEq/L   Potassium 3.5  3.5 - 5.3 mEq/L   Chloride 106 100 - 111 mEq/L   CO2 25 24 - 33 mEq/L   Anion gap, ser/plas 7 5 - 16 mEq/L   BUN 11 7 - 27 mg/dL   GLUCOSE, RANDOM 97 60 - 159 mg/dL   Creatinine 9.17 <=8.65 mg/dL   Glomerular filtration rate, nonAfrican American >60 >=60 mL/min   GLOMERULAR FILTRATION RATE - AFRICAN AMERICAN >60 >=60 mL/min   Comment, glomerular filtration rate SEE NOTE   TROPONIN I  Result Value Ref Range   Troponin I <0.02 0.00 - 0.04 ng/mL  CBC W AUTOMATED DIFFERENTIAL  Result Value Ref Range   WBC COUNT 7.8 3.7 - 11.1 K/uL   Red blood cells count 4.49 4.10 - 5.70 M/uL   Hgb 13.7 13.0 - 17.0 g/dL   Hematocrit 59.1 60.9 - 51.0 %   MCV 91 80 - 100 fL   RDW, RBC 13.4 12.0 - 16.5 %   Platelets count 307 140 - 400 K/uL   RBC's, nucleated 0 <=0 /100WC  WBC AUTOMATED DIFFERENTIAL  Result Value Ref Range   Neutrophils %, automated count 67 42 - 76 %   Lymphocytes %, automated count 25 15 - 47 %   Monos %, auto 6 5 - 13 %   Eosinophils %, automated count 1 0 - 7 %   Basophils %, automated count 0 0 - 1 %   Neutrophils auto count 5.2 1.8 - 7.9 K/uL   Xr Chest, Pa And Lateral...  Result Date: 01/16/2019 ROUTINE CHEST ** HISTORY **: 68 year old man with chest pain. Information extracted from ordering/medical system- Ordering comment: None provided. Additional clinical information: None provided. Associated diagnoses: None provided. ** FINDINGS **: Technique: PA and lateral views were obtained per protocol. Comparison: 06/06/2018, 12/04/2016, 03/14/2018. Cardiomediastinal silhouette remains normal in size.  Trace patient rotation to the LEFT.  A button projects at the level of the medial RIGHT fourth and fifth posterior intercostal space.  No new pulmonary edema, focal consolidation, or pleural effusion.  No pneumothorax. Visualized bony structures of the thorax are grossly unchanged.   No acute cardiopulmonary disease. Aortic atherosclerosis, best seen on the prior CT.   ED Course: 1:27  PM  Discussed with patient, will fu with PMD.  Case was discussed extensively with the patient, who is understanding and agreement with the plan at the time of disposition.  All questions were answered prior to disposition.  Patient understands importance of close follow-up, and will return to the emergency department sooner for any new or concerning symptoms  Impression:  Chest discomfort  Disposition:  Discharge home, condition good  Alyce Berth, MD Emergency Medicine   I have confirmed the presence of the below clinical diagnoses, which were considered in the current and ongoing care of the patient. At the time of this visit, the medical record indicates, and/or the patient states, that there are no changes in these conditions unless otherwise noted. As treatment warrants, the patient has been advised to follow up with PCP or appropriate specialist.    INTERMITTENT ASTHMA (Chronic)   PROSTATE CANCER (Chronic)   HYPERLIPIDEMIA (Chronic)  IRRITABLE BOWEL SYNDROME (Chronic)   ATHEROSCLEROSIS OF AORTA (Chronic)   CAD, PRESENCE OF STENT (Chronic)   MAJOR DEPRESSIVE DISORDER, RECURRENT EPISODE (Chronic)     Electronically signed by Doretha Alyce Hand (M.D.) at 01/16/2019  1:27 PM PDT

## (undated) NOTE — Progress Notes (Signed)
 Formatting of this note is different from the original.  HPI Comments: SUBJECTIVE:   John Spencer is a 89 Y male who presents for ER f/u. Went to er on 03/17/14. c/o intermittent chest pain for 3 days, sometimes at rest, sometimes with exertion. 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 No significant dyspnea, no diaphoresis, nausea, vomiting, or weakness. No meds at home (no NTG or asa). Er wus, including ekg, cxr and labs were unremarkable. 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 Unfortunately his initial read showed no           cancer - on F/U review a small focus of Gl 3+3 was           detected. On expectant management.       INTERMITTENT ASTHMA (J45.20)    MAJOR DEPRESSION, RECURRENT (F33.9)    LOWER URINARY TRACT SYMPTOMS (LUTS) (R39.9)    ROSACEA (L71.9)    URTICARIA (L50.9)  Family History:   No First degree relative with early CAD or early death. SH: no EtoH, no tobacco, no drug,  Outpatient Prescriptions Marked as Taking for the 03/19/14 encounter (Office Visit) with Hulen [place] (M.D.) Medication Sig Dispense Refill ? Aspirin  (ECOTRIN LOW STRENGTH) 81 mg Oral TBEC DR Tab Take 1 tablet orally  daily 100 0 ? Atorvastatin (LIPITOR) 80 mg Oral Tab Take 1 tablet orally daily for cholesterol 30 0 ? Meclizine (ANTIVERT) 12.5 mg Oral Tab Take 1 tablet orally every 8 hours when needed for dizziness 10 0 ? buPROPion (WELLBUTRIN XL) 150 mg Oral 24hr SR Tab Take 1 tablet orally every morning for 4 days , then take 2 tablets orally daily 60 3  Examination: BP 107/63 mmHg  Pulse 90  Temp(Src) 98.5 F (36.9 C) (Oral)  Wt 172 lb 4.8 oz (78.155 kg)  SpO2 97% GENERAL: Well built and well nourished,  in no distress.  Alert, awake and Oriented times three. HEENT: Pupils equal and reactive to light, throat normal  NECK: supple, no adenopathy or thyromegaly CVS: S1 and S2 normal, no murmurs, regular rate and rhythm chest:  no reproduceble pain on chest,  No edema,rash,ecchymosis,deformity, PULM: Lungs clear to auscultate, good air  entry, no wheezes, rhonchi or rales CV: RRR, no G/R/M. no JVD. no edema  Review of Systems  Constitutional: Negative.   HENT: Negative.   Respiratory: Negative.   Cardiovascular: Negative.   Skin: Negative.   Neurological: Positive for dizziness. Negative for tingling, tremors, sensory change, speech change, focal weakness, seizures and loss of consciousness.   Physical Exam  Constitutional: He is well-developed, well-nourished, and in no distress.  Nursing note and vitals reviewed.    A/P: NEAR SYNCOPE  (primary encounter diagnosis) Note: non-acute Plan: EVENT MONITOR, 30 DAY Er precautions  CAD, PRESENCE OF STENT Note: stable  ATHEROSCLEROSIS OF AORTA Note: con't bp, lipid control  IRRITABLE BOWEL SYNDROME Note: stable  HYPERLIPIDEMIA Note: well control  PROSTATE CANCER Note: stable con't monitering  INTERMITTENT ASTHMA Note: good control. Con't current Tx  MAJOR DEPRESSIVE DISORDER, RECURRENT EPISODE, MODERATE Note: mild, stable  Electronically signed by Tang, Hai-Tao (M.D.) at 03/25/2014 11:20 PM PST

## (undated) NOTE — Progress Notes (Signed)
 Formatting of this note is different from the original. [facility] Cardiology [facility] Visit  REASON FOR VISIT: 1. CAD s/p Resolute DES to LAD and RCA   HISTORY OF PRESENT ILLNESS: John Spencer is a 57 Y male with history of CAD s/p DES to LAD and RCA in 2014 after a positive stress test in 2014.  He has been now noticing shortness of breath with exertion when he is exerting himself with some very mild chest pressure, doesn't feel quite the same as before.  He notes this has been present for months, and is not worsening.  He has been taking aspirin  daily.  He was only atorvastatin 20mg  restarted about 6 months ago and LDL is 169.  He does not play tennis any more due to shoulder pain.  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  Per note from Dr. Berenice 12/15/12: Per resident Dr. Amado Bumpers:  Has been having off/on lightheadedness x several months. Two days ago, was very lightheaded after waking up. Seen in clinic on 12/01/12 with chest discomfort. Felt a sharp jab over left chest during activities (lifting things, picking up something), one time pushed hands against each other and felt the sharp pain. Would go away when stop activities. No trauma. EKG was done showing q waves in V2-V3. Prescribed nitroglycerin, but has not needed to use it. Since visit, no more such pain. Was told in the past in [place] that he has had a heart attack (8-9 years ago).   Baseline: plays tennis 1-3x a week, but not consistently the last few months due to low energy. The last tennis session was <1 week ago, felt fatigue but no chest discomfort, slight lightheadedness.  Here for EKG stress test today. Feeling fine, just tired, prior to test. A few minutes into the test, felt chest heaviness and lightheadedness. Not the kind of chest pain 2 weeks ago.  Nonsmoker, smokes marijuana (every day). No alcohol. Father had three heart attacks, started in his 45s. Older brother had stents placed in his  early 75s.   ROS: no fevers or chills; no shortness of breath, LLQ abdominal pain has been going on for months (has been worked up), no LE edema, no orthopnea.  My note: History reviewed and I agree with findings.   Atypical chest pain.  His recent sharp, brief, focal left anterior thoracic chest pain was musculoskeletal in character. We have reassured him about this pain and that he does not need to come to Emergency Department for this. However, his chest discomfort during exercise treadmill test today was much more typical of angina, onset within 4 minutes of starting accelerated Bruce protocol, and associated with an increase in venticular ectopics . We are concerned that this represents new onset angina with intermediate risk of future cardiac event and have therefore arranged cardiac catheterization, started aspirin  and atorvastatin, nitroglycerin prn, and cautioned him to come to Emergency Department for more than 10 minutes of this kind of chest discomfort. Cardiac catheterization booked Thursday 12/22/12 Dr Edwin, [facility], to arrive at 6 AM. IN sinus, with no valve lesions, left ventricular failure, right ventricular failure, peripheral vascular disease. Labs and EKG ordered. Of note, I have explained to him that his dizziness, which occurred today with the chest discomfort and has occurred in the past, may not be related to coronary artery disease and may need further investigation. Given printed catheterization info, link to EMMI.   CV Risk  Factors/CV studies:   Known CAD  PAST MEDICAL HISTORY Reviewed. No past surgical history on file.  SOCIAL HISTORY: Social History  Substance Use Topics  ? Smoking status: Former Smoker    Packs/day: 0.50    Years: 2.00    Types: Cigarettes    Quit date: 03/17/1976  ? Smokeless tobacco: Never Used     Comment: smokes MJ several times a week  ? Alcohol use No   OCCUPATION: baker  FAMILY HISTORY: Family History  Problem Relation  Age of Onset  ? Colon Cancer Sister        died age 105   OUTPATIENT MEDICATIONS: I reviewed the medication list.  Outpatient Prescriptions Marked as Taking for the 07/01/17 encounter (Office Visit) with Lezlie Elveria Seeds (M.D.)  Medication Sig Dispense Refill  ? Aspirin  81 mg Oral Chew Tab CHEW AND SWALLOW 1 TABLET ORALLY DAILY 100 3  ? Atorvastatin (LIPITOR) 40 mg Oral Tab TAKE 1 TABLET ORALLY DAILY TO PREVENT HEART ATTACKS AND STROKES 100 3  ? Terazosin (HYTRIN) 2 mg Oral Cap Take 1 capsule by mouth daily at bedtime for 1 week. If ineffective, may increase by 1 capsule every week to a maximum of 5 capsules per day 100 capsule 3   ALLERGIES: No Known Allergies  REVIEW OF SYSTEMS: General ROS: negative  Psychological ROS: negative Ophthalmic ROS: negative ENT ROS: negative  Hematological and Lymphatic ROS: negative Endocrine ROS: negative Respiratory ROS: no cough, shortness of breath, or wheezing Cardiovascular ROS: see above HPI Gastrointestinal ROS: negative  VITALS: BP 134/70   Pulse 82   Temp 98.6 F (37 C) (Tympanic)   Wt 89.4 kg (197 lb)   BMI 27.09 kg/m  Wt Readings from Last 1 Encounters:  07/01/17 89.4 kg (197 lb)   PHYSICAL EXAM: Appearance: no acute distress, comfortable Venous and Carotid Exam: normal carotid upstrokes, no carotid bruit  Apical impulse: normal, no right sided lifts or heaves Heart: RRR, nl S1 and S2, no m/r/g   Lungs: chest is clear, no wheezing, no rales and unlabored breathing  Abdomen: nondistended  Peripheral pulses: 2+ pedal and radial pulses Extremities: no edema Psych: normal affect, euthymic mood Neuro:  non-focal  SELECTED RESULTS:  Basename Value Date/Time  ? WBC                          8.4 06/21/2017  ? HCT                         52.3 06/21/2017  ? HGB                         16.7 06/21/2017  ? PLT                          284 06/21/2017  ? CHOL                         264 06/21/2017  ? CHOL                          264 06/21/2017  ? HDL                           45 06/21/2017  ? HDL  45 06/21/2017  ? LDL CALC                       116 10/17/2007  ? LDL DIRECT                   147 04/03/2016  ? TRIG                         250 06/21/2017  ? ALT                           35 06/21/2017  ? AST                           14 09/22/2013  ? BUN                           12 12/04/2016  ? CREAT                       1.08 06/21/2017  ? GFR-AFRAM                    >60 06/21/2017  ? GFR NONAFR AMER              >60 06/21/2017  ? GFR-NONAFRAM                   >60 02/05/2009  ? K                            4.4 06/21/2017  ? NA                           136 12/04/2016  ? CO2                           25 12/04/2016  ? CL                           103 12/04/2016  ? TSH                         1.90 06/21/2017  ? PSA                          7.8 06/21/2017  ? PSA                          5.8 04/03/2016  ? PSA                          6.2 12/02/2014  ? INR                          1.0 01/30/2014  ? INR                          1.1 01/13/2013  ? INR  1.1 12/15/2012  ? GLUC FAST                       92 10/17/2007  ? GLUC                          84 06/21/2017  ? HGBA1C %                     5.7 06/21/2017  ? ESTIMATED AVERAGE GLUCOSE    117 06/21/2017   Basename Value Date/Time  ? B TYPE NATRIURETIC PEPTIDE     28 09/22/2013   Basename Value Date/Time  ? TSH   1.90 06/21/2017   Review of Other Relevant Studies: MPS 12/06/16 Normal appearing SPECT myocardial perfusion study, unchanged from the 07-10-2015 scan.    2. Gated dynamic acquisition demonstrates normal left ventricular wall motion. The left ventricular ejection fraction calculated as part of the stress imaging acquisition is approximately 53 % which was statistically unchanged from the 55 % calculated on 07-10-2015.    #U0     LHC 01/18/13 ----- Conclusions -----  Procedure  Summary [facility] Cardic Cath Lab Radial Access Procedure Note Referring MD: Georgann NOVAK Operator: Kayla Quivers, MD Pre procedure diagnosis: ASCVD presenting with symptoms and abnormal TMTing Post procedure diagnosis: LAD and RCA disease Procedure: Coronary Arteriography and ad hoc multi-vessel PCI to LAD and RCA Hemostasis: TR Band Contrast volume: 120 cc's Complications: small asymptomatic LAD trailing edge dissection treated with the 2nd LAD stent LMCA: 0 % LAD: 90 % treated with DESx2 (Resolute <Zotarolimus> 3.5x15, 3.0x15) Cx: 30 % RCA: 70 % treated with DESx2 (Resolute <Zotarolimus> 3.0x30, 3.0x22) The stented segments enjoyed an excellent angiographic result.  Recommendations Plan: Routine post procedure care Plavix should be continued until 01/18/2014. Routine surveillance angiography not necessary. Thank you for this referral. Please do not hesitate in contacting me if I can be of further assistance. Kayla Quivers MD  ____________________________________________________________________ ASSESSMENT: 89 Y male with history of CAD s/p DES to LAD and RCA in 2014 after a positive stress test in 2014 with dyspnea on exertion.  I have ordered a stress echo as his prior stress test was only positive by chest heaviness.  I have increased his atorvastatin to 40mg  daily.  PROBLEMS: CAD (CORONARY ARTERY DISEASE)  HYPERLIPIDEMIA  DYSPNEA ON EXERTION  PLAN: 1. Continue ASA, statin 2. Goal LDL < 100 given known CAD 3. Stress echo  I have confirmed the presence of the above clinical diagnoses, which were considered in the current and ongoing care of the patient. At the time of this visit, the medical record indicates, and/or the patient states, that there are no changes in these conditions unless otherwise noted. As treatment warrants, the patient has been advised to follow up with his PCP or appropriate specialist.   The total visit time face to  face with the patient was over 25 minutes. Time spent in counseling and discussion with the patient was over 20 minutes. Topics discussed as noted above.  Elveria Sabins, MD [facility] Cardiology   Electronically signed by Sabins Elveria Seeds (M.D.) at 07/01/2017  9:17 AM PDT

## (undated) NOTE — ED Notes (Signed)
 Formatting of this note is different from the original. Emergency Department Note:  69 Y  male  CHEST DISCOMFORT  Hx obtained from patient - good historian Prior medical record, if available, was reviewed. 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 It was initially associated with mild diaphoresis and mild nausea, now resolved.  He also reports some L UE paresthesias that began with the CP.  He complains of persistent mild to moderate lightheadedness which is not changed by laying flat.  No SOB.  No abd pain.  No new back pain.  No LE swelling or pain.  Does not exercise regularly.  He has been feeling noticeably more tired than usual over the past 2 weeks.   No f/c, cough, URI symptoms.  No DOE.  Patient states he had echocardiogram and ETT based on abnormal EKG about 4-5 years ago in [place], [place].  There was no further workup, and the patient reports benign findings on these tests.  PMHx:  Patient Active Problem List:    URTICARIA   DIVERTICULITIS OF COLON  PSHx:  No past surgical history on file. Med:  Active Medications as of 05/24/2007: BUPROPION 100 MG ORAL SR TAB,  Sig: Take 2 tablets orally every morning , and take 1 tablet every evening  Allergies:  Review of patient's allergies indicates no known allergies.  ROS: Relevant systems reviewed and negative, unless noted above.  FAMHx: Father with MI in his 48s.  Brother with MI in his 71s.  (+)HTN.  SOC:  No tobacco  (+) MJ daily; No other current illicits (used to use other recreational drugs over 10-20 years ago)  No EtOH abuse   Works as a Engineer, production at [employer].  Married.  EXAM: BP 144/80  Pulse 84  Temp 98.9 F (37.2 C)  Resp 12  SpO2 98% General appearance - Well-developed, well-nourished, well-hydrated, appears well, pleasant, no distress.   Head -  Normocephalic, atraumatic Eyes -  Pupils equal, extraocular movements intact, anicteric, no photophobia OP - Clear with MMM Neck - Supple, no goiter, no stridor Chest - Normal respiratory effort   clear to auscultation, no wheezes, crackles, or rhonchi Heart - Regular rate and rhythm   no murmur appreciated Abdomen - Nondistended, soft, NTTP, normoactive bowel sounds, no masses. Psych -  Appropriate   normal affect Neurological - Alert and oriented x 4  Cranial nerve 2-12 and motor exam grossly normal bilaterally Extremities - No cyanosis or edema noted Skin - No gross rash on exposed areas of body Back - No CVAT  DIAGNOSTICS: ECG (read by me): SR @ 94.  Normal axis and intervals.  LVH with strain.  No STT changes of significance compared to 11 Jan 2006. Repeat ECG (read by me): No change from prior. CXR (read by me): No wide cardiomediastinum, pneumothorax, effusion, infiltrate, free air, or other apparent emergent findings. Bedside iSTAT Trop: Negative Recent Results (from the past 8 hours)  CKMB   Collection Time 05/24/07  8:30 PM  Component Value Range  ? CK       64  <170- (U/L)  ? CKMB  NOT IND  <7.0- (ng/ml)  TROPONIN I   Collection Time 05/24/07  8:30 PM  Component Value Range  ? TROPONIN I     0.05  0.00-0.09 (ng/mL)  ?  INTERPRETATION SEE NOTE  -   D-DIMER, SEMI-QUANT   Collection Time 05/24/07 10:13 PM  Component Value Range  ? FIBRIN D-DIMER      233  - (ng/mL)   Narrative:  Normal:    45 - 500 ng/mL FEU (Fibrinogen Equivalent Units)     ^ Abnormal:  > 500 ng/mL FEU  (Fibrinogen Equivalent Units)^ ^ Interpretation:^ ^ Cutoff value for D-Dimer by this method is 500 ng/mL FEU.  It has a high negative predictive value that a normal result rules out the diagnosis of DVT or PE.  An abnormal result is not a definitive diagnosis but indicates that further diagnostic testing may be warranted. ^   EMERGENCY DEPARTMENT COURSE AND TREATMENT: No response to NTG x 3 SL.   Morphine 4mg  seems to have improved the CP; still some L UE paresthesias.  1400: VSS.  No new complaints.  Still some L UE paresthesias. 1700: repeat Trop neg, but patient with recurrent CP.  Unable to obtain outside [place] records. 2130: Patient still complains of L sided chest pain that has been constant, and cannot be reproduced by stretching or ROM or palpation of chest wall. VSS, occasional dip in HR to 40s while resting, but BP stable.  HR usually in the 50-60s.  Did have mild orthostatics based on HR change upon standing, plus somehwhat lightheaded, so IVF bolus given earlier.  0030: VSS.  Feeling well to go home.  MEDICAL DECISION MAKING/IMPRESSION: Despite somewhat typical CP, the patient did not respond to NTG, and seems to have more reproducible CP with ROM of arms.  He has no known risk factors for CAD.  His serial Troponins have been negative.  NO evidence of acute emergent process based on workup in ED.  Lightheadedness is likely due to mild dehydration, based on orthostasis.  PLAN/Rx: Low risk patient (recommendation: discharge home with stress study within 72hrs; f/u with PCP within one week.)  Low risk if ALL of the below criteria are true: Chest pain not consistent with classic angina Normal Troponin at least 6 hours after onset of chest pain Normal exam and vital signs Unchanged ECG Absence of diabetes  No prior MI, CABG, PTCA, etc  Absence of other atherosclerotic disease (eg. PVD, CVA)  Cleanup and/or reconciliation of medications was performed, and appropriately-updated medication list (including medications started today if any, meds to continue if any, and meds to discontinue if any; and also including all essential information ie. name, dose, frequency of each medicine) was recorded in the medical record, and if applicable, explained and distributed in accordance with protocol/policy (unless patient unable to cooperate, or insufficient information to proceed, or  technical/printing difficulties were encountered.)  Standard RTED precautions were provided in writing and reviewed with the patient and/or their accompanying responsible party, who expressed understanding.  Electronically signed by REXANN DUNNINGS ABBOUD MD   05/24/2007   9:26 PM.   Electronically signed by Marcia REXANN DUNNINGS (M.D.) at 05/25/2007 12:43 AM PST

## (undated) NOTE — ED Notes (Signed)
 Formatting of this note might be different from the original. Patient placed on BP cuff, cardiac monitor, and pulse oximeter. Bed lowered with rails up and call light placed within reach. Warm blankets given. Electronically signed by Knox Shanda Hendricks Eppie at 03/17/2014 12:29 PM PST

## (undated) NOTE — ED Notes (Signed)
 Formatting of this note is different from the original. 3:14 AM Signed out to me by Dr. Heddy to f/u on 2nd tro and ekg and if normal, d/c pt home  Recent Results (from the past 12 hour(s))  CHEM 7 (NA, K, CL, CO2, BUN, GLUC, CR)   Collection Time: 07/09/15 11:25 PM  Result Value Ref Range   Sodium 138 135 - 145 mEq/L   Potassium 3.5 3.5 - 5.3 mEq/L   Chloride 102 100 - 111 mEq/L   CO2 28 24 - 33 mEq/L   Anion gap, ser/plas 8 5 - 16 mEq/L   BUN 16 7 - 27 mg/dL   GLUCOSE, RANDOM 891 60 - 159 mg/dL   Creatinine 9.02 <=8.65 mg/dL   Glomerular filtration rate, nonAfrican American >60 >=60 mL/min   GLOMERULAR FILTRATION RATE - AFRICAN AMERICAN >60 >=60 mL/min   Comment, glomerular filtration rate SEE NOTE   TROPONIN I   Collection Time: 07/09/15 11:25 PM  Result Value Ref Range   Troponin I <0.02 0.00 - 0.04 ng/mL   TROPONIN I INTERPRETATION SEE NOTE   CBC W AUTOMATED DIFFERENTIAL   Collection Time: 07/09/15 11:25 PM  Result Value Ref Range   WBC COUNT 10.1 3.5 - 12.5 K/uL   Red blood cells count 4.72 4.10 - 5.70 M/uL   Hgb 14.9 13.0 - 17.0 g/dL   Hematocrit 56.5 60.9 - 51.0 %   MCV 92 80 - 100 fL   RDW, RBC 12.7 12.0 - 16.5 %   RBC's, nucleated 0 <=0 /100WC   Platelets count 239 140 - 400 K/uL   Differential panel AUTO DIF   WBC AUTOMATED DIFFERENTIAL   Collection Time: 07/09/15 11:25 PM  Result Value Ref Range   Neutrophils %, automated count 60 41 - 79 %   Lymphocytes %, automated count 30 13 - 44 %   Monos %, auto 6 5 - 14 %   Eosinophils %, automated count 3 0 - 6 %   Basophils %, automated count 0 0 - 2 %   Neutrophils auto count 6.1 2.1 - 7.4 K/uL  TROPONIN I   Collection Time: 07/10/15  2:04 AM  Result Value Ref Range   Troponin I <0.02 0.00 - 0.04 ng/mL   TROPONIN I INTERPRETATION SEE NOTE   ekg uncahgned from prior Based on local guidelines (link), which utilize the HEART Score when applicable,   with oupt thalliu persantine test Electronically signed by  Velia Aloysius Dotter (M.D.) at 07/10/2015  3:15 AM PDT

## (undated) NOTE — Nursing Note (Signed)
 Formatting of this note might be different from the original. 2nd call to EKG tech regarding taking patient for test.  Pt. Angry and wants to leave AMA due to the length of wait.  Dr. Oneil at bedside talking to patient.  Dr. Oneil has paged cardiology to request time of test and awaiting answer.   Electronically signed by Lennie Ronal Cy Mariana (R.N.) at 03/13/2014  1:38 PM PST

## (undated) NOTE — Progress Notes (Signed)
 Formatting of this note might be different from the original. ETT Report  60 Y male referred by Tang, Hai-Tao (M.D.)   Referring provider states pt c/o atypical CP >10 yrs; neg ETT 1 yr ago.  Rest EKG: NSR 1. Normal upsloping ST depression noted during exercise.  2. No angina. Stopped due to max effort/fatigue. 3. Completed accelerated Bruce ( stages) 8:4min = 12.8 METS achieved, above age predicted aerobic capacity of 9.5 METS,  88 % age max predicted heart rate= 142  4. Occ single PVC's noted (less frequent during exercise).   5. Normal HR and BP response to exercise. Pre test BP = 110/70, Max BP = 160/60. Pre heart rate = 75 , max heart rate = 142 6. Current Medications: Active Medications as of 02/11/2009: BUPROPION HCL 100 MG ORAL SR TAB  Conclusion: Negative for ischemia @ high workload. Reviewed w/Dr. Lenon (supervising MD)  Follow up: Copy of report sent to Dr. Hulen  Treadmill Mortality Risk Score  3 yr survival:  99% 5 yr survival:  99% 10 yr survival: 97%  Based on this risk calculator, the patient is classified as low risk (3 year risk of death of < 3%). Clinical corrrelation recommended but per ACC guidelines most of these patients can be treated medically.  This calculator is based on the study by: Charlton MS, Pothier CE, Magid DJ, Claudene LORRAINE Able MW. An Externally Validated Model for Predicting Long-Term Survival Exercise Stress Treadmill Testing in Patients with Suspected Coronary Artery Disease and a Normal Electrocardiogram. Annals of Internal Medicine 2007;147:821-828.   Electronically signed by Derick Olam FELIX (R.N.) at 02/11/2009  9:16 AM PST

## (undated) NOTE — ED Notes (Signed)
 Tylenol  prn  Electronically signed by Isidor Ip (R.N.) at 08/23/2005 12:16 PM PDT

## (undated) NOTE — Nursing Note (Signed)
 Formatting of this note might be different from the original. Given nitro x's 1 , denies any chest discomfort at this time, will continue to monitor  Electronically signed by Bridgett Juliene Heinz (R.N.) at 07/09/2015 11:57 PM PDT Electronically signed by Bridgett Juliene Heinz (R.N.) at 07/09/2015 11:57 PM PDT

## (undated) NOTE — Nursing Note (Signed)
 Formatting of this note might be different from the original. Assumed care of patient from Ernestine RN at change of shift.  Pt. Resting in bed, denies CP, SOB, or dyspnea.  Skin warm and dry.  HR 50's on cardiac monitor and Dr. Oneil is aware.  Pt. Updated on plan of care.  Electronically signed by Lennie Ronal Cy Mariana (R.N.) at 03/13/2014  7:45 AM PST

## (undated) NOTE — ED Provider Notes (Signed)
 Formatting of this note is different from the original. EMERGENCY DEPARTMENT NOTE FOR John Spencer  Hx obtained from patient - Good historian PCP:  BOBBETTA LEVAN MD First encounter at 11:48 AM Date: 05/14/2019  Chief Complaint:  CHEST DISCOMFORT (fatigued , sob , chills, and cp for past three days. left sternal non radiating)  Nursing comment:   Chief Complaint  Patient presents with  ? CHEST DISCOMFORT    fatigued , sob , chills, and cp for past three days. left sternal non radiating   HISTORY OF PRESENT ILLNESS John Spencer is a 35 Y male with PMH CAD s/p DES to LAD and RCA in 2014 after a positive stress test in 2014 who presents with chest discomfort and feeling fatigued with chills and intermittent shortness of breath at rest  Onset: feeling more tired for past week, also leg soreness bilaterally and body aches, 2 days ago with chest discomfort intermittent and bilateral blurred vision no diplopia, just states fuzziness then yesterday chills and nausea and lightheaded with  Location: Left sided chest nonradiating, and diffuse bodyaches Duration: intermittent chest pain usually occurs for few minutes Quality: mild Exacerbating factors : nonexertional chest discomfort Alleviating factors: none, hasn't tried medications Associated factors: generalized weakness, body aches, intermittent shortness of breath with dry cough, lightheaded, nausea, increased urination, no vomiting  Risk Factors for CAD:      (-) hypertension    (-) diabetes            (-) hyperlipidemia    (-) tobacco use    (-) illicit drug use    (-) family history of early CAD Risk Factors for PE:      (-) history of DVT / PE     (-) leg pain/swelling     (-) CHF     (-) Known coagulation disorder      (-) recent surgery      (-) prolonged immobilization / travel     (-) malignancy     (-) trauma     (-)morbid obesity     (-) lupus / autoimmune disorders     (-) pregnancy/oral contraceptive  use Risk Factors for Aortic Dissection:    (-) pain radiating to back   (-) hypertension    (-)Connective Tissue Disorder    (-)congenital Aortic Valve Disease    (-) first degree relative with dissection  PAST MEDICAL HISTORY:   Patient Active Problem List:    URTICARIA   ROSACEA   LOWER URINARY TRACT SYMPTOMS   INTERMITTENT ASTHMA   PROSTATE CANCER   HYPERLIPIDEMIA   IRRITABLE BOWEL SYNDROME   ATHEROSCLEROSIS OF AORTA   SHOULDER INTERNAL IMPINGEMENT.   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 CATARACT SURGERY W IOL-PHACO Left 03/08/2019   Performed by Arvil Nuala NOVAK (M.D.) at [facility]   MEDICATIONS:  See medication list  ALLERGIES:   Patient has no known allergies.  SOCIAL HISTORY:   Denies Tobacco, Denies ETOH, Denies illicit drugs  Social History   Socioeconomic History  ? Marital status: Divorced    Spouse name: Not on file  ? Number of children: Not on file  ? Years of education: Not on file  ? Highest education level: Not on file  Occupational History  ? Occupation: Engineer, production  Tobacco Use  ? Smoking status: Former Smoker    Packs/day: 0.50    Years: 2.00    Pack years: 1.00    Types: Cigarettes    Quit date: 03/17/1976    Years since quitting: 43.1  ? Smokeless tobacco: Never Used  ? Tobacco comment: smokes MJ several times a week  Substance and Sexual Activity  ? Alcohol use: No    Alcohol/week: 0.0 standard drinks  ? Drug use: Yes  ? Sexual activity: Yes    Partners: Female    Comment: marijuana  Social History Narrative   Living arrangements - the patient lives alone.   04/02/2015   3 grandchildren, 22,18 and 1yo (in [place] )     Family History  Problem Relation Age of Onset  ? Colon Cancer  Sister        died age 81   REVIEW OF SYSTEMS: Negative except as noted in HPI  PHYSICAL EXAM: Constitutional: BP 172/97   Pulse 99   Temp 98.9 F (37.2 C)   Resp 18   Wt 76.1 kg (167 lb 12.3 oz)   SpO2 98%   BMI 23.07 kg/m   male lying comfortably in bed in NAD, tired appearing, no acute respiratory distress Eyes:  EOMI, normal conjunctiva, PERRLA HENT:  Atraumatic, normal Oropharynx, moist oral mucosa, normal external ear Neck:  supple, no vertebral tenderness, no gross thyro-megaly.  Respiratory:  normal respiratory effort, clear to auscultation, no wheezing, rales or rhonchi CV:  RRR.  Symmetric pulses - radial. Abdomen:  Non distended, +BS, soft, non tender, no guarding, no rebound.  No significant hepatosplenomegaly palpated. Musculoskeletal: FROM in all extremities, normal gait, no pedal edema. Neuro:  normal sensory, motor, normal speech and follows commands appropriately. Psych:  A&O x3, appropriate affect. Skin:  no rash, warm and dry  MEDICAL DECISION MAKING:   Patient appears nontoxic and VS are WITHIN NORMAL LIMITS   BP 172/97   Pulse 99   Temp 98.9 F (37.2 C)   Resp 18   Wt 76.1 kg (167 lb 12.3 oz)   SpO2 98%   BMI 23.07 kg/m .  Differential diagnosis: Cardiac (acute/unstable ischemic coronary disease, vasospasm, stable angina, pericarditis, pericardial tamponade), pulmonary (pulmonary embolus, asthma/copd, pneumonia, pneumothorax), musculoskeletal (sprain, strain, fracture), GI (GERD, gastritis, peptic ulcer disease, pancreatitis), vascular (aortic aneurysm, aortic dissection). I have considered all of the elements of the relevant differential diagnosis.   Doubt cardiac given lack of risk factors, atypical history and normal exam  Doubt PULMONARY EMBOLISM given lack of pleuritic chest pain, tachycardia, hypoxia, tachypnea and other risk factors by history  Doubt aortic dissection given lack of risk factors, physical exam and another diagnosis is more  likely.   Initial plan and interventions:  Will plan to get basic labs, EKG, chest xray  and will give pain meds as needed, however patient declining pain medication at this time. Will continue to reevaluate. Reviewed patient history.  EKG: (read by me) NSR @ 85, nl intervals, no acute ST elevations or TWI, with PVCs, similar to previous  LABWORK: (reviewed by me) Results for orders placed or performed during the hospital encounter of 05/14/19  CHEM 7 (NA, K, CL, CO2, BUN, GLUC, CR)  Result Value Ref Range   Sodium 136 135 - 145 mEq/L   Potassium 4.2 3.5 - 5.3 mEq/L   Chloride 102 100 - 111 mEq/L   CO2 26 24 - 33 mEq/L   Anion gap,  ser/plas 8 5 - 16 mEq/L   BUN 11 7 - 27 mg/dL   GLUCOSE, RANDOM 899 60 - 159 mg/dL   Creatinine 9.10 <=8.65 mg/dL   Glomerular filtration rate, nonAfrican American >60 >=60 mL/min   GLOMERULAR FILTRATION RATE - AFRICAN AMERICAN >60 >=60 mL/min   Comment, glomerular filtration rate SEE NOTE   AST  Result Value Ref Range   AST 12 10 - 40 U/L  ALT  Result Value Ref Range   ALT 7 0 - 47 U/L  BILIRUBIN, TOTAL  Result Value Ref Range   Bilirubin, total 0.6 0.2 - 1.2 mg/dL  ALKALINE PHOSPHATASE  Result Value Ref Range   ALKALINE PHOSPHATASE 70 37 - 117 U/L  LIPASE  Result Value Ref Range   Lipase 19 <=90 U/L  CALCIUM , SERUM  Result Value Ref Range   Calcium  9.0 8.8 - 10.5 mg/dL  MAGNESIUM  Result Value Ref Range   MAGNESIUM 1.7 1.7 - 2.3 mg/dL  PHOSPHATE  Result Value Ref Range   Phosphorus 2.6 (L) 2.7 - 4.5 mg/dL  TROPONIN I  Result Value Ref Range   Troponin I 0.02 0.00 - 0.04 ng/mL  B-TYPE NATRIURETIC PEPTIDE (BNP)  Result Value Ref Range   B type natriuretic protein 89 <=100 pg/mL  PT  Result Value Ref Range   PT 12.7 11.7 - 14.3 second(s)   PT INR 1.0 Ratio  SARS-COV-2 (COVID-19), INFLUENZA A + B AND RSV, MULTIPLEX NAA, INPATIENT AND ED  Result Value Ref Range   SARS-COV-2 (COVID-19) PANEL, NAA COVID NOT DETECTED    Influenza  virus A RNA, RT-PCR, qual FLU A NOT DETECTED    Influenza virus B RNA, RT-PCR, qual FLU B NOT DETECTED    Respiratory syncytial virus RNA, PCR RSV NOT DETECTED    Specimen source NP/OP   CBC W AUTOMATED DIFFERENTIAL  Result Value Ref Range   WBC COUNT 7.4 3.7 - 11.1 K/uL   Red blood cells count 4.92 4.10 - 5.70 M/uL   Hgb 15.3 13.0 - 17.0 g/dL   Hematocrit 54.1 60.9 - 51.0 %   MCV 93 80 - 100 fL   RDW, RBC 12.8 12.0 - 16.5 %   Platelets count 287 140 - 400 K/uL   RBC's, nucleated 0 <=0 /100WC  WBC AUTOMATED DIFFERENTIAL  Result Value Ref Range   Neutrophils %, automated count 63 42 - 76 %   Lymphocytes %, automated count 28 15 - 47 %   Monos %, auto 7 5 - 13 %   Eosinophils %, automated count 2 0 - 7 %   Basophils %, automated count 0 0 - 1 %   Neutrophils auto count 4.6 1.8 - 7.9 K/uL   IMAGING: (reviewed by me)  Xr Chest  Result Date: 05/14/2019 XRAY CHEST ** HISTORY **: 29 years old,  CHEST PAIN ** TECHNIQUE **: 1 view of the chest acquired. COMPARISON: Radiograph 01/16/2019 ** FINDINGS **: LUNGS: Clear. No pleural effusion. MEDIASTINUM/OTHER: Normal cardiomediastinal silhouette. Aortic atherosclerosis.   No acute disease.  ED COURSE: John Spencer is a 63 y/o man with a history of coronary revascularization, CAD, hyperlipidemia and aortic atherosclerosis who presents with left-located chest discomfort since at least 05:06, on 05/14/2019 lasting 6-20 min. The chest discomfort is not worse with inspiration, is not worse with palpation, is not occurring in a crescendo pattern, and does not have a sharp/stabbing quality. The chest discomfort is associated with dyspnea at rest.  @ 13:04 Patient was reassessed and  had improved still declining pain medication for 3/10 mild discomfort.  @ 13:30 Discussed results of visit in detail that labwork within normal limits, chest xray within normal limits, however given patient's risk factors and previous MI per RISTRA recommend stress test  <72 hours versus prior to discharge and d/w patient risks/benefits and he prefers to come back to Emergency Department if worsening symptoms and otherwise have stress test outpatient if repeat trop is stable.   Discussed with patient plan for follow up with PCP in 2-3 days and e-consult and order placed for NM stress 2/17.  Discussed with patient indications to return. Patient understands and agrees with plan for safe discharge with close follow up.  Future Appointments    Appointment Date & Time Visit Type Provider Department/Facility   May 17, 2019  1:00 PM PST [facility] [id number] NUCLEAR MEDICINE DEPARTMENT [facility]    DIAGNOSIS:  Chest discomfort  CONDITION: Stable DISPOSITION: Home  Leita Pierce Post, MD         Electronically signed by Kwoh, Laura Katharine (M.D.) at 05/14/2019  3:59 PM PST

## (undated) NOTE — Progress Notes (Signed)
 Formatting of this note is different from the original. I have reviewed and analyzed the results of the treadmill stress test, including whether symptoms were present, exercise capacity, heart rate and blood pressure response to exercise, any EKG abnormalities, and any arrhythmias.  My interpretation is: Negative for ischemia at high workload. Echo images reported separately.    ICD-10-CM  1. DYSPNEA ON EXERTION R06.09   Electronically signed by Maree Cinderella Bott (M.D.) at 07/07/2017  4:05 PM PDT

## (undated) NOTE — Progress Notes (Signed)
 Formatting of this note is different from the original. Stress Echocardiogram Report  32 Y male referred by Lezlie Elveria Seeds MD  Referring provider states pt   with CAD s/p DES to LAD and RCA 2014 with DOE and LDL 169 most recently     Rest EKG: NSR, PVC's 1. No ST-T changes  2. No angina. Stopped due to max effort/dyspnea 3. Completed accelerated Bruce ( stages) 5:42min = 10.1 METS achieved, above age predicted aerobic capacity of 7.3 METS,  87 % age max predicted heart rate achieved  4. Frequent PVC's at rest; less frequent at peak exercise and immediate recovery. No ventricular runs. 5. Normal HR and BP response to exercise. Pre test BP = 140/70, Max BP = 160/70. Pre heart rate = 75 , max heart rate = 133 6. Current Medications:  Outpatient Prescriptions Marked as Taking for the 07/07/17 encounter (Procedure Only) with STRESS TEST  Medication  ? Aspirin  81 mg Oral Chew Tab  ? Atorvastatin (LIPITOR) 40 mg Oral Tab   Conclusion: No ECG evidence of ischemia at good workload. Fairly frequent PVC's (c/w previous EKG's). Reviewed w/Dr. Maree (supervising MD). Full interpretation of echo findings to follow.   Electronically signed by Derick Olam FELIX (R.N.) at 07/07/2017  4:05 PM PDT

---

## 2019-06-11 ENCOUNTER — Emergency Department (HOSPITAL_COMMUNITY)
Admission: EM | Admit: 2019-06-11 | Discharge: 2019-06-11 | Disposition: A | Discharge status: Home / Self Care | Payer: Medicare (Managed Care) | Attending: Emergency Medicine | Admitting: Emergency Medicine

## 2019-06-11 ENCOUNTER — Encounter (HOSPITAL_COMMUNITY): Payer: Self-pay

## 2019-06-11 ENCOUNTER — Other Ambulatory Visit: Payer: Self-pay

## 2019-06-11 DIAGNOSIS — N401 Enlarged prostate with lower urinary tract symptoms: Secondary | ICD-10-CM | POA: Diagnosis not present

## 2019-06-11 DIAGNOSIS — N138 Other obstructive and reflux uropathy: Secondary | ICD-10-CM

## 2019-06-11 DIAGNOSIS — E119 Type 2 diabetes mellitus without complications: Secondary | ICD-10-CM | POA: Insufficient documentation

## 2019-06-11 DIAGNOSIS — Z87891 Personal history of nicotine dependence: Secondary | ICD-10-CM | POA: Diagnosis not present

## 2019-06-11 DIAGNOSIS — R339 Retention of urine, unspecified: Secondary | ICD-10-CM | POA: Insufficient documentation

## 2019-06-11 HISTORY — DX: Type 2 diabetes mellitus without complications: E11.9

## 2019-06-11 HISTORY — DX: Benign prostatic hyperplasia without lower urinary tract symptoms: N40.0

## 2019-06-11 LAB — URINALYSIS, ROUTINE W REFLEX MICROSCOPIC
Bacteria, UA: NONE SEEN
Bilirubin Urine: NEGATIVE
Glucose, UA: NEGATIVE mg/dL
Ketones, ur: NEGATIVE mg/dL
Leukocytes,Ua: NEGATIVE
Nitrite: NEGATIVE
Protein, ur: NEGATIVE mg/dL
Specific Gravity, Urine: 1.013 (ref 1.005–1.030)
pH: 5 (ref 5.0–8.0)

## 2019-06-11 MED ORDER — ONDANSETRON 4 MG PO TBDP: 4.0000 mg | ORAL_TABLET | Freq: Three times a day (TID) | ORAL | 0 refills | Status: DC | PRN

## 2019-06-11 MED ORDER — ONDANSETRON 4 MG PO TBDP
4.0000 mg | ORAL_TABLET | Freq: Once | ORAL | Status: AC
  Administered 2019-06-11: 4 mg via ORAL
  Filled 2019-06-11: qty 1

## 2019-06-11 NOTE — ED Triage Notes (Signed)
Patient presented to ED with severe pain. Patient report he has not urinate since this morning. Hx. BPH.

## 2019-06-11 NOTE — ED Provider Notes (Signed)
[facility] DEPT Provider Note   CSN: [id number] Arrival date & time: 06/11/19  [id number]     History No chief complaint on file.   John Spencer is a 69 y.o. male.  HPI  Patient is a 69 year old male who recently moved to [place] from [place].  He states his only medical issues are well controlled diabetes and BPH which she states has been observed but never intervened on.  He states he has never had any biopsies done.  He states that he woke up this morning was having extreme difficulty urinating.  He states that he has some dribbling but was unable to completely of his bladder.  Patient states that prior to this morning he is not having any urinary difficulty.  He states he had a somewhat decreased stream of the last few years however he was told this was normal with his BPH.  He denies any fevers, chills, back pain, flank pain, abdominal pain, pelvic pain, penile pain.  He denies any hematuria, dysuria frequency urgency.  Denies any chest pain, shortness of breath, nausea vomiting or diarrhea.     Past Medical History:  Diagnosis Date  . BPH (benign prostatic hyperplasia)   . Diabetes mellitus without complication ([place])     There are no problems to display for this patient.      No family history on file.  Social History   Tobacco Use  . Smoking status: Former Smoker  Substance Use Topics  . Alcohol use: Not on file  . Drug use: Not on file    Home Medications Prior to Admission medications   Not on File    Allergies    Patient has no allergy information on record.  Review of Systems   Review of Systems  Constitutional: Negative for chills and fever.  HENT: Negative for congestion.   Respiratory: Negative for shortness of breath.   Cardiovascular: Negative for chest pain.  Gastrointestinal: Negative for abdominal pain, diarrhea, nausea, rectal pain and vomiting.  Genitourinary: Positive for decreased urine volume.  Negative for dysuria, flank pain, frequency, genital sores and hematuria.       Inability to void  Musculoskeletal: Negative for neck pain.  Neurological: Negative for light-headedness.    Physical Exam Updated Vital Signs BP 140/68   Pulse (!) 54   Temp 98 F (36.7 C) (Oral)   Resp 18   Ht 5\' 11"  (1.803 m)   Wt 72.6 kg   SpO2 97%   BMI 22.32 kg/m   Physical Exam Vitals and nursing note reviewed.  Constitutional:      General: He is in acute distress.     Appearance: Normal appearance. He is not ill-appearing.     Comments: Patient in no acute distress  HENT:     Head: Normocephalic and atraumatic.     Mouth/Throat:     Mouth: Mucous membranes are moist.  Eyes:     General: No scleral icterus.       Right eye: No discharge.        Left eye: No discharge.     Conjunctiva/sclera: Conjunctivae normal.  Cardiovascular:     Rate and Rhythm: Normal rate.     Comments: Heart rate 64 my exam Pulmonary:     Effort: Pulmonary effort is normal.     Breath sounds: No stridor.  Abdominal:     General: There is no distension.     Tenderness: There is no abdominal tenderness. There is no right  CVA tenderness, left CVA tenderness or guarding.     Comments: Abdomen soft nontender.  Skin:    General: Skin is warm and dry.  Neurological:     Mental Status: He is alert and oriented to person, place, and time. Mental status is at baseline.     ED Results / Procedures / Treatments   Labs (all labs ordered are listed, but only abnormal results are displayed) Labs Reviewed  URINALYSIS, ROUTINE W REFLEX MICROSCOPIC - Abnormal; Notable for the following components:      Result Value   Hgb urine dipstick SMALL (*)    All other components within normal limits  URINE CULTURE    EKG None  Radiology No results found.  Procedures Procedures (including critical care time)  Medications Ordered in ED Medications - No data to display  ED Course  I have reviewed the triage vital  signs and the nursing notes.  Pertinent labs & imaging results that were available during my care of the patient were reviewed by me and considered in my medical decision making (see chart for details).  Clinical Course as of Jun 10 1136  Sun Jun 11, 2019  0942 558 mL of urine in bladder on bedside US conducted by me   [WF]    Clinical Course User Index [WF] Tedd Sias, [place]   MDM Rules/Calculators/A&P                      Patient is a 69 year old male with past medical history of BPH presented today with urinary hesitancy and inability to void.  He is in acute distress initially.  Physical exam unremarkable apart from tenderness to palpation of suprapubic area over bladder.  Palpable distended bladder.  Bedside ultrasound showed 0.5 L of urine in bladder.  Urinary bladder decompressed with Foley catheter which yielded just over 0.5 L of urine.  Urinalysis shows small hemoglobin.  Some concern for bladder cancer as patient is elderly, has remote smoking history and hematuria.  He will follow up with urology.  Discharged with Foley catheter in place and given Foley catheter care instructions.  He will call tomorrow morning to make an appointment with urology.  Patient given return precautions which include inability to urinate, fevers, back pain.  Or any new or concerning symptoms.  I discussed this case with my attending physician who cosigned this note including patient's presenting symptoms, physical exam, and planned diagnostics and interventions. Attending physician stated agreement with plan or made changes to plan which were implemented.    Final Clinical Impression(s) / ED Diagnoses Final diagnoses:  Urinary retention  Benign prostatic hyperplasia with urinary obstruction    Rx / DC Orders ED Discharge Orders    None       Tedd Sias, [place] 06/11/19 1138    Malvin Johns, MD 06/11/19 1316

## 2019-06-11 NOTE — Discharge Instructions (Signed)
Please call urology tomorrow morning to make an appointment.  Please inform them that you were seen in the emergency department and had a Foley catheter placed as you are unable to urinate.

## 2019-06-11 NOTE — ED Notes (Signed)
Patient offer a foley leg bag but refuse. Patient wanted to keep the big bag. Patient educate on how to empty the foley cath bag and advise to call urology Monday morning. Pt verbalized understanding.

## 2019-06-12 LAB — URINE CULTURE: Culture: NO GROWTH

## 2019-08-01 ENCOUNTER — Other Ambulatory Visit: Payer: Self-pay | Admitting: Family Medicine

## 2019-08-01 DIAGNOSIS — C61 Malignant neoplasm of prostate: Secondary | ICD-10-CM

## 2019-08-02 ENCOUNTER — Other Ambulatory Visit: Payer: Self-pay | Admitting: Family Medicine

## 2019-08-24 ENCOUNTER — Other Ambulatory Visit: Payer: Medicare (Managed Care)

## 2019-09-22 ENCOUNTER — Ambulatory Visit
Admission: RE | Admit: 2019-09-22 | Discharge: 2019-09-22 | Disposition: A | Discharge status: Home / Self Care | Payer: Medicare Other | Source: Ambulatory Visit | Attending: Family Medicine | Admitting: Family Medicine

## 2019-09-22 DIAGNOSIS — C61 Malignant neoplasm of prostate: Secondary | ICD-10-CM

## 2019-09-22 IMAGING — MR MR PROSTATE WO/W CM
56 series · 56 of 56 positions shown · IV contrast (15ml Multihance)
Comparison: None.

CLINICAL DATA: Elevated PSA history of prostate cancer since [5J].
Outside biopsy none available.

EXAM:
MR PROSTATE WITHOUT AND WITH CONTRAST
TECHNIQUE: Multiplanar multisequence MRI images were obtained of the pelvis
centered about the prostate. Pre and post contrast images were
obtained.
CONTRAST:  15mL MULTIHANCE GADOBENATE DIMEGLUMINE 529 MG/ML IV SOLN

[Series 3: bSSFP fat-sat · axial · 8.0mm · 0.74mm/px · 1 of 28 slices shown]
[im 1/28]
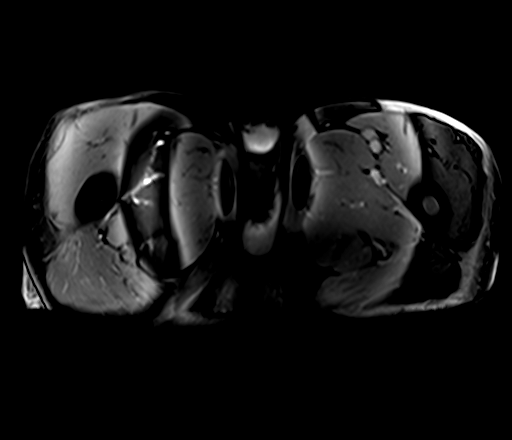

[Series 4: T1 · axial · 5.0mm · 1.25mm/px · 1 of 80 slices shown]
[im 1/80]
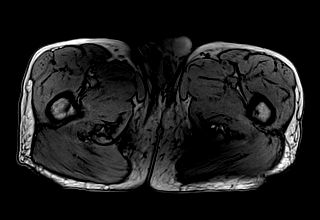

[Series 5: T2 · coronal · 3.5mm · 0.56mm/px · 1 of 28 slices shown (1 of 3)]
[im 1/28]
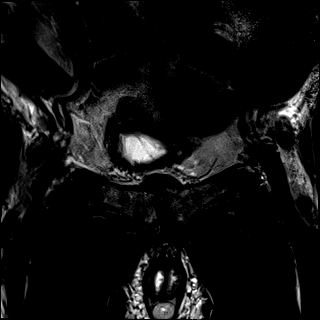

[Series 6: DWI · axial · 3.5mm · 1.75mm/px · 1 of 81 slices shown (1 of 3)]
[im 1/81]
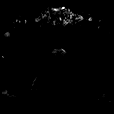

[Series 7: DWI · axial · 3.5mm · 1.75mm/px · 1 of 27 slices shown (2 of 3)]
[im 1/27]
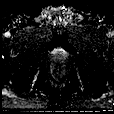

[Series 8: DWI · axial · 3.5mm · 1.56mm/px · 1 of 27 slices shown (3 of 3)]
[im 1/27]
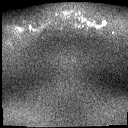

[Series 9: T2 · axial · 3.5mm · 0.56mm/px · 1 of 27 slices shown (2 of 3)]
[im 1/27]
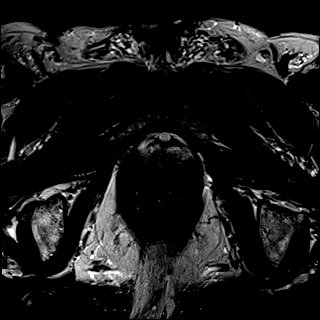

[Series 10: T2 · axial · 1.0mm · 1.04mm/px · 1 of 88 slices shown (3 of 3)]
[im 1/88]
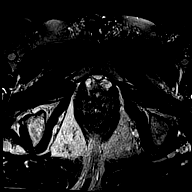

[Series 11: pre t1_twist_tra_dyn_ttc=6.4s · axial · non-contrast · 3.5mm · 0.83mm/px · 1 of 24 slices shown]
[im 1/24]
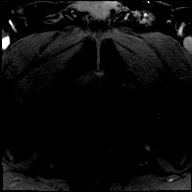

[Series 12: post t1_twist_tra_dyn-copy center · axial · 3.5mm · 0.83mm/px · 1 of 24 slices shown (1 of 24)]
[im 1/24]
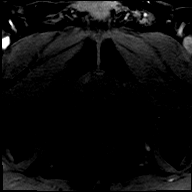

[Series 13: post t1_twist_tra_dyn-copy center · axial · 3.5mm · 0.83mm/px · 1 of 24 slices shown (2 of 24)]
[im 1/24]
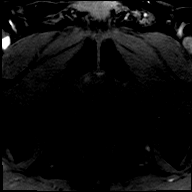

[Series 14: post t1_twist_tra_dyn-copy cent_sub_ttc=(id) · axial · 3.5mm · 0.83mm/px · 1 of 24 slices shown (1 of 23)]
[im 1/24]
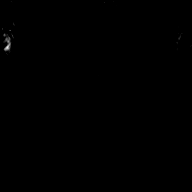

[Series 15: post t1_twist_tra_dyn-copy center · axial · 3.5mm · 0.83mm/px · 1 of 24 slices shown (3 of 24)]
[im 1/24]
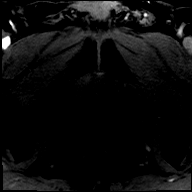

[Series 16: post t1_twist_tra_dyn-copy cent_sub_ttc=(id) · axial · 3.5mm · 0.83mm/px · 1 of 24 slices shown (2 of 23)]
[im 1/24]
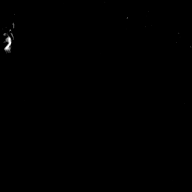

[Series 17: post t1_twist_tra_dyn-copy center · axial · 3.5mm · 0.83mm/px · 1 of 24 slices shown (4 of 24)]
[im 1/24]
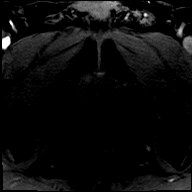

[Series 18: post t1_twist_tra_dyn-copy cent_sub_ttc=(id) · axial · 3.5mm · 0.83mm/px · 1 of 24 slices shown (3 of 23)]
[im 1/24]
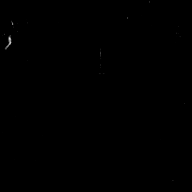

[Series 19: post t1_twist_tra_dyn-copy center · axial · 3.5mm · 0.83mm/px · 1 of 24 slices shown (5 of 24)]
[im 1/24]
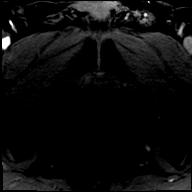

[Series 20: post t1_twist_tra_dyn-copy cent_sub_ttc=(id) · axial · 3.5mm · 0.83mm/px · 1 of 24 slices shown (4 of 23)]
[im 1/24]
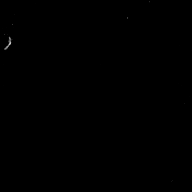

[Series 21: post t1_twist_tra_dyn-copy center · axial · 3.5mm · 0.83mm/px · 1 of 24 slices shown (6 of 24)]
[im 1/24]
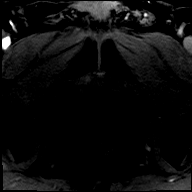

[Series 22: post t1_twist_tra_dyn-copy cent_sub_ttc=(id) · axial · 3.5mm · 0.83mm/px · 1 of 24 slices shown (5 of 23)]
[im 1/24]
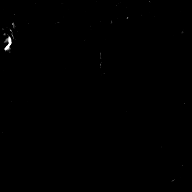

[Series 23: post t1_twist_tra_dyn-copy center · axial · 3.5mm · 0.83mm/px · 1 of 24 slices shown (7 of 24)]
[im 1/24]
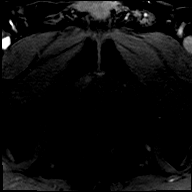

[Series 24: post t1_twist_tra_dyn-copy cent_sub_ttc=(id) · axial · 3.5mm · 0.83mm/px · 1 of 24 slices shown (6 of 23)]
[im 1/24]
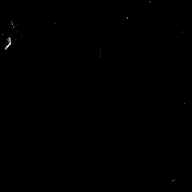

[Series 25: post t1_twist_tra_dyn-copy center · axial · 3.5mm · 0.83mm/px · 1 of 24 slices shown (8 of 24)]
[im 1/24]
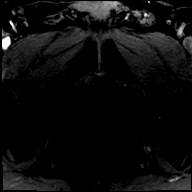

[Series 26: post t1_twist_tra_dyn-copy cent_sub_ttc=(id) · axial · 3.5mm · 0.83mm/px · 1 of 24 slices shown (7 of 23)]
[im 1/24]
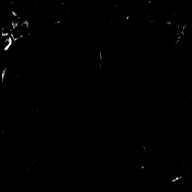

[Series 27: post t1_twist_tra_dyn-copy center · axial · 3.5mm · 0.83mm/px · 1 of 24 slices shown (9 of 24)]
[im 1/24]
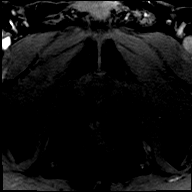

[Series 28: post t1_twist_tra_dyn-copy cent_sub_ttc=(id) · axial · 3.5mm · 0.83mm/px · 1 of 24 slices shown (8 of 23)]
[im 1/24]
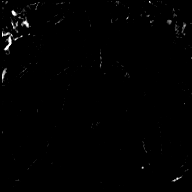

[Series 29: post t1_twist_tra_dyn-copy center · axial · 3.5mm · 0.83mm/px · 1 of 24 slices shown (10 of 24)]
[im 1/24]
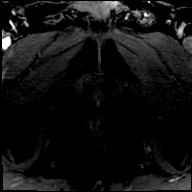

[Series 30: post t1_twist_tra_dyn-copy cent_sub_ttc=(id) · axial · 3.5mm · 0.83mm/px · 1 of 24 slices shown (9 of 23)]
[im 1/24]
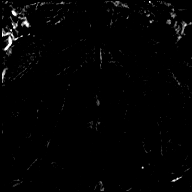

[Series 31: post t1_twist_tra_dyn-copy center · axial · 3.5mm · 0.83mm/px · 1 of 24 slices shown (11 of 24)]
[im 1/24]
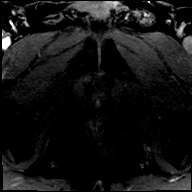

[Series 32: post t1_twist_tra_dyn-copy cent_sub_ttc=(id) · axial · 3.5mm · 0.83mm/px · 1 of 24 slices shown (10 of 23)]
[im 1/24]
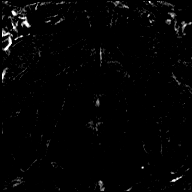

[Series 33: post t1_twist_tra_dyn-copy center · axial · 3.5mm · 0.83mm/px · 1 of 24 slices shown (12 of 24)]
[im 1/24]
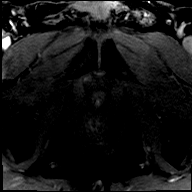

[Series 34: post t1_twist_tra_dyn-copy cent_sub_ttc=(id) · axial · 3.5mm · 0.83mm/px · 1 of 24 slices shown (11 of 23)]
[im 1/24]
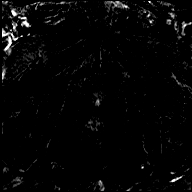

[Series 35: post t1_twist_tra_dyn-copy center · axial · 3.5mm · 0.83mm/px · 1 of 24 slices shown (13 of 24)]
[im 1/24]
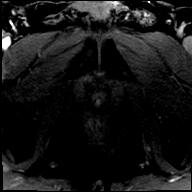

[Series 36: post t1_twist_tra_dyn-copy cent_sub_ttc=(id) · axial · 3.5mm · 0.83mm/px · 1 of 24 slices shown (12 of 23)]
[im 1/24]
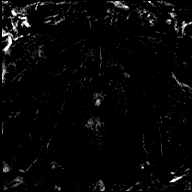

[Series 37: post t1_twist_tra_dyn-copy center · axial · 3.5mm · 0.83mm/px · 1 of 24 slices shown (14 of 24)]
[im 1/24]
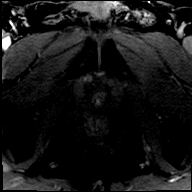

[Series 38: post t1_twist_tra_dyn-copy cent_sub_ttc=(id) · axial · 3.5mm · 0.83mm/px · 1 of 24 slices shown (13 of 23)]
[im 1/24]
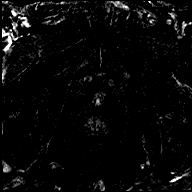

[Series 39: post t1_twist_tra_dyn-copy center · axial · 3.5mm · 0.83mm/px · 1 of 24 slices shown (15 of 24)]
[im 1/24]
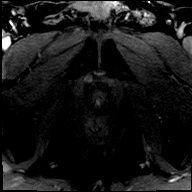

[Series 40: post t1_twist_tra_dyn-copy cent_sub_ttc=(id) · axial · 3.5mm · 0.83mm/px · 1 of 24 slices shown (14 of 23)]
[im 1/24]
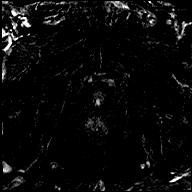

[Series 41: post t1_twist_tra_dyn-copy center · axial · 3.5mm · 0.83mm/px · 1 of 24 slices shown (16 of 24)]
[im 1/24]
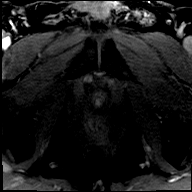

[Series 42: post t1_twist_tra_dyn-copy cent_sub_ttc=(id) · axial · 3.5mm · 0.83mm/px · 1 of 24 slices shown (15 of 23)]
[im 1/24]
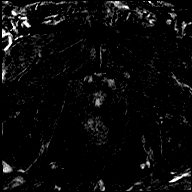

[Series 43: post t1_twist_tra_dyn-copy center · axial · 3.5mm · 0.83mm/px · 1 of 24 slices shown (17 of 24)]
[im 1/24]
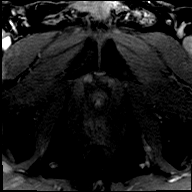

[Series 44: post t1_twist_tra_dyn-copy cent_sub_ttc=(id) · axial · 3.5mm · 0.83mm/px · 1 of 24 slices shown (16 of 23)]
[im 1/24]
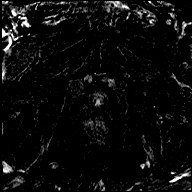

[Series 45: post t1_twist_tra_dyn-copy center · axial · 3.5mm · 0.83mm/px · 1 of 24 slices shown (18 of 24)]
[im 1/24]
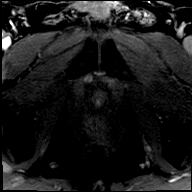

[Series 46: post t1_twist_tra_dyn-copy cent_sub_ttc=(id) · axial · 3.5mm · 0.83mm/px · 1 of 24 slices shown (17 of 23)]
[im 1/24]
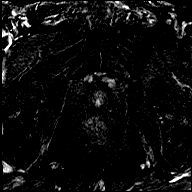

[Series 47: post t1_twist_tra_dyn-copy center · axial · 3.5mm · 0.83mm/px · 1 of 24 slices shown (19 of 24)]
[im 1/24]
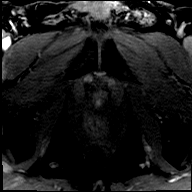

[Series 48: post t1_twist_tra_dyn-copy cent_sub_ttc=(id) · axial · 3.5mm · 0.83mm/px · 1 of 24 slices shown (18 of 23)]
[im 1/24]
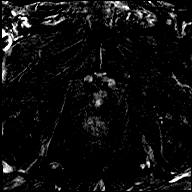

[Series 49: post t1_twist_tra_dyn-copy center · axial · 3.5mm · 0.83mm/px · 1 of 24 slices shown (20 of 24)]
[im 1/24]
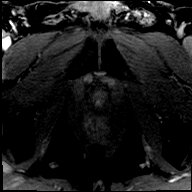

[Series 50: post t1_twist_tra_dyn-copy cent_sub_ttc=(id) · axial · 3.5mm · 0.83mm/px · 1 of 24 slices shown (19 of 23)]
[im 1/24]
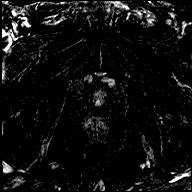

[Series 51: post t1_twist_tra_dyn-copy center · axial · 3.5mm · 0.83mm/px · 1 of 24 slices shown (21 of 24)]
[im 1/24]
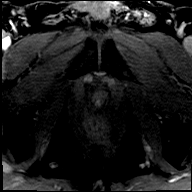

[Series 52: post t1_twist_tra_dyn-copy cent_sub_ttc=(id) · axial · 3.5mm · 0.83mm/px · 1 of 24 slices shown (20 of 23)]
[im 1/24]
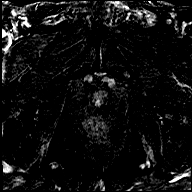

[Series 53: post t1_twist_tra_dyn-copy center · axial · 3.5mm · 0.83mm/px · 1 of 24 slices shown (22 of 24)]
[im 1/24]
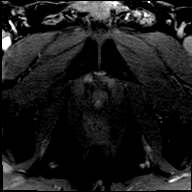

[Series 54: post t1_twist_tra_dyn-copy cent_sub_ttc=(id) · axial · 3.5mm · 0.83mm/px · 1 of 24 slices shown (21 of 23)]
[im 1/24]
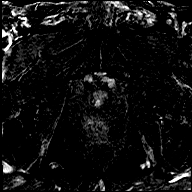

[Series 55: post t1_twist_tra_dyn-copy center · axial · 3.5mm · 0.83mm/px · 1 of 24 slices shown (23 of 24)]
[im 1/24]
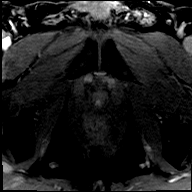

[Series 56: post t1_twist_tra_dyn-copy cent_sub_ttc=(id) · axial · 3.5mm · 0.83mm/px · 1 of 24 slices shown (22 of 23)]
[im 1/24]
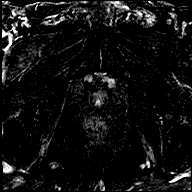

[Series 57: post t1_twist_tra_dyn-copy center · axial · 3.5mm · 0.83mm/px · 1 of 24 slices shown (24 of 24)]
[im 1/24]
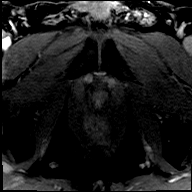

[Series 58: post t1_twist_tra_dyn-copy cent_sub_ttc=(id) · axial · 3.5mm · 0.83mm/px · 1 of 24 slices shown (23 of 23)]
[im 1/24]
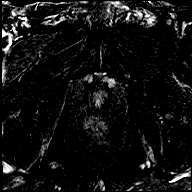

[56 of 56 positions shown; findings below may reference images not displayed]

FINDINGS: Prostate: Normal high signal intensity in the peripheral zone on T2
weighted imaging (series 9). No foci of restricted diffusion within
the peripheral zone (series 6 and series 7)

The prostate gland is enlarged by multiple well capsulated nodules.
No suspicious imaging findings on T2 weighted imaging.

Postcontrast enhanced T1 weighted imaging demonstrates no suspicious
enhancement pattern.

Prostatic capsule is intact.  Seminal vesicles normal.

Volume: 6.8 x 6.6 x 7.8 cm (volume = 180 cm^3)

Transcapsular spread:  Absent

Seminal vesicle involvement: Absent

Neurovascular bundle involvement: Absent

Pelvic adenopathy: Absent

Bone metastasis: None

Other findings: None
IMPRESSION: 1. No high-grade carcinoma in peripheral zone.  PI-RADS: 1.
2. Enlarged nodular transitional zone consistent with benign
prostate hypertrophy. PI-RADS: 2.
3. Prostatomegaly.

## 2019-09-22 MED ORDER — GADOBENATE DIMEGLUMINE 529 MG/ML IV SOLN
15.0000 mL | Freq: Once | INTRAVENOUS | Status: AC | PRN
  Administered 2019-09-22: 15 mL via INTRAVENOUS

## 2020-01-30 ENCOUNTER — Other Ambulatory Visit: Payer: Self-pay

## 2020-01-30 ENCOUNTER — Ambulatory Visit: Payer: Medicare Other | Admitting: Podiatry

## 2020-01-30 ENCOUNTER — Ambulatory Visit (INDEPENDENT_AMBULATORY_CARE_PROVIDER_SITE_OTHER): Payer: Medicare Other

## 2020-01-30 ENCOUNTER — Other Ambulatory Visit: Payer: Self-pay | Admitting: Podiatry

## 2020-01-30 DIAGNOSIS — M778 Other enthesopathies, not elsewhere classified: Secondary | ICD-10-CM | POA: Diagnosis not present

## 2020-01-30 DIAGNOSIS — L603 Nail dystrophy: Secondary | ICD-10-CM

## 2020-01-30 DIAGNOSIS — M779 Enthesopathy, unspecified: Secondary | ICD-10-CM

## 2020-01-30 NOTE — Progress Notes (Signed)
  Subjective:  Patient ID: John Spencer, male    DOB: 1950/07/02,  MRN: [id number] HPI Chief Complaint  Patient presents with  . Foot Pain    Pt has bilateral dorsal foot pain. Pt states its a sore/ stiffness feeling. Pt stat4ess this has been an issue for the last 3 months. THe only aggravating factors are at night, when the cover touches his toes. Pt has not seeked any type of treatment.    69 y.o. male presents with the above complaint.   ROS: Denies fever chills nausea vomiting muscle aches pains calf pain back pain chest pain shortness of breath.  Past Medical History:  Diagnosis Date  . BPH (benign prostatic hyperplasia)   . Diabetes mellitus without complication (HCC)      Current Outpatient Medications:  .  tamsulosin (FLOMAX) 0.4 MG CAPS capsule, TAKE 1 TO 2 CAPSULES BY MOUTH EVERY DAY, Disp: , Rfl:  .  ondansetron (ZOFRAN ODT) 4 MG disintegrating tablet, Take 1 tablet (4 mg total) by mouth every 8 (eight) hours as needed for nausea or vomiting., Disp: 20 tablet, Rfl: 0  No Known Allergies Review of Systems Objective:  There were no vitals filed for this visit.  General: Well developed, nourished, in no acute distress, alert and oriented x3   Dermatological: Skin is warm, dry and supple bilateral. Nails x 10 are well maintained; remaining integument appears unremarkable at this time. There are no open sores, no preulcerative lesions, no rash or signs of infection present.  Hallux bilateral demonstrates a thick yellow dystrophic possibly mycotic nail.  Vascular: Dorsalis Pedis artery and Posterior Tibial artery pedal pulses are 2/4 bilateral with immedate capillary fill time. Pedal hair growth present. No varicosities and no lower extremity edema present bilateral.   Neruologic: Grossly intact via light touch bilateral. Vibratory intact via tuning fork bilateral. Protective threshold with Semmes Wienstein monofilament intact to all pedal sites bilateral. Patellar and  Achilles deep tendon reflexes 2+ bilateral. No Babinski or clonus noted bilateral.   Musculoskeletal: No gross boney pedal deformities bilateral. No pain, crepitus, or limitation noted with foot and ankle range of motion bilateral. Muscular strength 5/5 in all groups tested bilateral.  He has some mild tenderness on range of motion of the first metatarsophalangeal joint particularly the hallux left which is limited in his dorsiflexion.  Gait: Unassisted, Nonantalgic.    Radiographs:  Radiographs taken today demonstrate an osseously mature individual some osteoarthritic changes at the first metatarsophalangeal joints bilaterally but no other significant abnormalities are identified.  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  Plan: Discussed etiology pathology conservative versus surgical therapies at this point I debrided the toenails and remove the small abscess hallux left.  Nearly removing the entire toenail to get this.  He tolerated it well.  Samples were sent for pathologic evaluation.  He will start soaking Epson salts and warm water and apply a small amount of Neosporin and a Band-Aid.  I will follow-up with him in a few weeks to make sure he is doing well and we will discuss the findings of the pathology result.     Trystan Eads T. [place], [place]

## 2020-03-07 ENCOUNTER — Other Ambulatory Visit: Payer: Self-pay

## 2020-03-07 ENCOUNTER — Ambulatory Visit (INDEPENDENT_AMBULATORY_CARE_PROVIDER_SITE_OTHER): Payer: Medicare Other | Admitting: Podiatry

## 2020-03-07 DIAGNOSIS — M7752 Other enthesopathy of left foot: Secondary | ICD-10-CM

## 2020-03-07 DIAGNOSIS — M778 Other enthesopathies, not elsewhere classified: Secondary | ICD-10-CM | POA: Diagnosis not present

## 2020-03-07 MED ORDER — TRIAMCINOLONE ACETONIDE 40 MG/ML IJ SUSP
40.0000 mg | Freq: Once | INTRAMUSCULAR | Status: AC
  Administered 2020-03-07: 40 mg

## 2020-03-07 MED ORDER — TRIAMCINOLONE ACETONIDE 40 MG/ML IJ SUSP: 20.0000 mg | Freq: Once | INTRAMUSCULAR | Status: DC

## 2020-03-07 NOTE — Progress Notes (Signed)
He presents today for follow-up of his soreness in his foot he states is a little bit tender here as he points to the second and third metatarsophalangeal joints of the left foot.  He is also here for the pathology results.  Objective: Vital signs are stable alert oriented x3 there is no erythematous mild edema no cellulitis drainage or odor tenderness on palpation of his metatarsophalangeal joints 2 through 5 of the left foot.  Particularly #2 #3 #4.  Pathology report does demonstrate onychomycosis.  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  Plan: At this point he does not want to treat the onychomycosis.  I did inject him today with 10 mg of Kenalog to the second third and fourth interdigital spaces.  He tolerated procedure well without complications we will follow-up with him as needed.

## 2020-05-28 DEATH — deceased

## 2020-06-13 ENCOUNTER — Emergency Department (HOSPITAL_COMMUNITY)
Admission: EM | Admit: 2020-06-13 | Discharge: 2020-06-13 | Disposition: A | Discharge status: Home / Self Care | Payer: Medicare Other | Attending: Emergency Medicine | Admitting: Emergency Medicine

## 2020-06-13 ENCOUNTER — Other Ambulatory Visit: Payer: Self-pay

## 2020-06-13 ENCOUNTER — Encounter (HOSPITAL_COMMUNITY): Payer: Self-pay

## 2020-06-13 DIAGNOSIS — E119 Type 2 diabetes mellitus without complications: Secondary | ICD-10-CM | POA: Insufficient documentation

## 2020-06-13 DIAGNOSIS — R339 Retention of urine, unspecified: Secondary | ICD-10-CM | POA: Diagnosis present

## 2020-06-13 DIAGNOSIS — R10819 Abdominal tenderness, unspecified site: Secondary | ICD-10-CM | POA: Diagnosis not present

## 2020-06-13 DIAGNOSIS — R338 Other retention of urine: Secondary | ICD-10-CM

## 2020-06-13 DIAGNOSIS — Z87891 Personal history of nicotine dependence: Secondary | ICD-10-CM | POA: Insufficient documentation

## 2020-06-13 LAB — URINALYSIS, ROUTINE W REFLEX MICROSCOPIC
Bilirubin Urine: NEGATIVE
Glucose, UA: NEGATIVE mg/dL
Ketones, ur: NEGATIVE mg/dL
Leukocytes,Ua: NEGATIVE
Nitrite: NEGATIVE
Protein, ur: NEGATIVE mg/dL
Specific Gravity, Urine: 1.015 (ref 1.005–1.030)
pH: 5 (ref 5.0–8.0)

## 2020-06-13 NOTE — Discharge Instructions (Addendum)
Return to the emergency department if you develop recurrent urinary retention.  You should follow-up with your urologist in the next few days.

## 2020-06-13 NOTE — ED Notes (Signed)
Bladder scan displays 337mL urine in bladder.

## 2020-06-13 NOTE — ED Provider Notes (Signed)
[facility] DEPT Provider Note   CSN: [id number] Arrival date & time: 06/13/20  0011     History Chief Complaint  Patient presents with  . Urinary Retention    John Spencer is a 70 y.o. male.  Patient is a 70 year old male with history of diabetes and enlarged prostate.  Patient presents today for evaluation of urinary retention.  He has been unable to urinate since earlier today.  He denies any fevers or chills.  Patient states that this has happened once before.  He has been seen by urology who told him his prostate was enlarged.  He was prescribed Flomax, however this did not seem to work this evening when he took it.  The history is provided by the patient.       Past Medical History:  Diagnosis Date  . BPH (benign prostatic hyperplasia)   . Diabetes mellitus without complication ([place])     There are no problems to display for this patient.   History reviewed. No pertinent surgical history.     No family history on file.  Social History   Tobacco Use  . Smoking status: Former Research scientist (life sciences)  . Smokeless tobacco: Never Used  Substance Use Topics  . Alcohol use: Never  . Drug use: Never    Home Medications Prior to Admission medications   Medication Sig Start Date End Date Taking? Authorizing Provider  finasteride (PROSCAR) 5 MG tablet Take by mouth. 02/29/20   [provider]  ondansetron (ZOFRAN ODT) 4 MG disintegrating tablet Take 1 tablet (4 mg total) by mouth every 8 (eight) hours as needed for nausea or vomiting. 06/11/19   Tedd Sias, PA  tamsulosin (FLOMAX) 0.4 MG CAPS capsule TAKE 1 TO 2 CAPSULES BY MOUTH EVERY DAY 08/08/19   [provider]    Allergies    Patient has no known allergies.  Review of Systems   Review of Systems  All other systems reviewed and are negative.   Physical Exam Updated Vital Signs BP (!) 161/83 (BP Location: Left Arm)   Pulse 98   Temp 97.7 F (36.5 C) (Oral)   Resp  18   SpO2 96%   Physical Exam Vitals and nursing note reviewed.  Constitutional:      General: He is not in acute distress.    Appearance: He is well-developed. He is not diaphoretic.  HENT:     Head: Normocephalic and atraumatic.  Cardiovascular:     Rate and Rhythm: Normal rate and regular rhythm.     Heart sounds: No murmur heard. No friction rub.  Pulmonary:     Effort: Pulmonary effort is normal. No respiratory distress.     Breath sounds: Normal breath sounds. No wheezing or rales.  Abdominal:     General: Bowel sounds are normal. There is no distension.     Palpations: Abdomen is soft.     Tenderness: There is abdominal tenderness. There is no right CVA tenderness, left CVA tenderness, guarding or rebound.  Musculoskeletal:        General: Normal range of motion.     Cervical back: Normal range of motion and neck supple.  Skin:    General: Skin is warm and dry.  Neurological:     Mental Status: He is alert and oriented to person, place, and time.     Coordination: Coordination normal.     ED Results / Procedures / Treatments   Labs (all labs ordered are listed, but only abnormal results  are displayed) Labs Reviewed  URINALYSIS, ROUTINE W REFLEX MICROSCOPIC    EKG None  Radiology No results found.  Procedures Procedures   Medications Ordered in ED Medications - No data to display  ED Course  I have reviewed the triage vital signs and the nursing notes.  Pertinent labs & imaging results that were available during my care of the patient were reviewed by me and considered in my medical decision making (see chart for details).    MDM Rules/Calculators/A&P  Patient presenting here with complaints of urinary retention in the setting of an enlarged prostate.  Foley catheter was replaced with relief of symptoms.  Patient's urinalysis is clear.  He is given the option as to leaving the catheter in and following up with urology versus taking the catheter out  and seeing how things proceed.  Patient prefers to have the catheter removed.  Patient to be discharged with follow-up.  Final Clinical Impression(s) / ED Diagnoses Final diagnoses:  None    Rx / DC Orders ED Discharge Orders    None       Veryl Speak, MD 06/13/20 [phone]

## 2020-06-13 NOTE — ED Triage Notes (Signed)
Pt arrives EMS with c/o urinary retention and dysuria for 3 hours pta.

## 2020-08-05 ENCOUNTER — Encounter (HOSPITAL_COMMUNITY): Payer: Self-pay | Admitting: Pharmacy Technician

## 2020-08-05 ENCOUNTER — Other Ambulatory Visit: Payer: Self-pay

## 2020-08-05 ENCOUNTER — Emergency Department (HOSPITAL_COMMUNITY)
Admission: EM | Admit: 2020-08-05 | Discharge: 2020-08-05 | Disposition: A | Discharge status: Home / Self Care | Payer: Medicare Other | Attending: Emergency Medicine | Admitting: Emergency Medicine

## 2020-08-05 DIAGNOSIS — Z87891 Personal history of nicotine dependence: Secondary | ICD-10-CM | POA: Diagnosis not present

## 2020-08-05 DIAGNOSIS — E119 Type 2 diabetes mellitus without complications: Secondary | ICD-10-CM | POA: Insufficient documentation

## 2020-08-05 DIAGNOSIS — R42 Dizziness and giddiness: Secondary | ICD-10-CM

## 2020-08-05 DIAGNOSIS — R001 Bradycardia, unspecified: Secondary | ICD-10-CM | POA: Diagnosis present

## 2020-08-05 LAB — CBC WITH DIFFERENTIAL/PLATELET
Abs Immature Granulocytes: 0.02 10*3/uL (ref 0.00–0.07)
Basophils Absolute: 0.1 10*3/uL (ref 0.0–0.1)
Basophils Relative: 1 %
Eosinophils Absolute: 0.1 10*3/uL (ref 0.0–0.5)
Eosinophils Relative: 2 %
HCT: 45.9 % (ref 39.0–52.0)
Hemoglobin: 15.2 g/dL (ref 13.0–17.0)
Immature Granulocytes: 0 %
Lymphocytes Relative: 29 %
Lymphs Abs: 2.2 10*3/uL (ref 0.7–4.0)
MCH: 32.2 pg (ref 26.0–34.0)
MCHC: 33.1 g/dL (ref 30.0–36.0)
MCV: 97.2 fL (ref 80.0–100.0)
Monocytes Absolute: 0.5 10*3/uL (ref 0.1–1.0)
Monocytes Relative: 6 %
Neutro Abs: 4.6 10*3/uL (ref 1.7–7.7)
Neutrophils Relative %: 62 %
Platelets: 266 10*3/uL (ref 150–400)
RBC: 4.72 MIL/uL (ref 4.22–5.81)
RDW: 12.2 % (ref 11.5–15.5)
WBC: 7.5 10*3/uL (ref 4.0–10.5)
nRBC: 0 % (ref 0.0–0.2)

## 2020-08-05 LAB — COMPREHENSIVE METABOLIC PANEL
ALT: 11 U/L (ref 0–44)
AST: 13 U/L — ABNORMAL LOW (ref 15–41)
Albumin: 3.7 g/dL (ref 3.5–5.0)
Alkaline Phosphatase: 75 U/L (ref 38–126)
Anion gap: 7 (ref 5–15)
BUN: 13 mg/dL (ref 8–23)
CO2: 25 mmol/L (ref 22–32)
Calcium: 8.9 mg/dL (ref 8.9–10.3)
Chloride: 105 mmol/L (ref 98–111)
Creatinine, Ser: 1.07 mg/dL (ref 0.61–1.24)
GFR, Estimated: >60 mL/min (ref >60)
Glucose, Bld: 96 mg/dL (ref 70–99)
Potassium: 3.9 mmol/L (ref 3.5–5.1)
Sodium: 137 mmol/L (ref 135–145)
Total Bilirubin: 0.8 mg/dL (ref 0.3–1.2)
Total Protein: 7.1 g/dL (ref 6.5–8.1)

## 2020-08-05 LAB — TROPONIN I (HIGH SENSITIVITY)
Troponin I (High Sensitivity): 5 ng/L (ref <18)
Troponin I (High Sensitivity): 4 ng/L (ref <18)

## 2020-08-05 LAB — CBG MONITORING, ED: Glucose-Capillary: 96 mg/dL (ref 70–99)

## 2020-08-05 MED ORDER — TAMSULOSIN HCL 0.4 MG PO CAPS
0.4000 mg | ORAL_CAPSULE | Freq: Once | ORAL | Status: AC
  Administered 2020-08-05: 0.4 mg via ORAL
  Filled 2020-08-05: qty 1

## 2020-08-05 NOTE — Discharge Instructions (Addendum)
You were seen in the ER for dizziness.  It is unclear why you are having the symptoms.  We recommend that he see your primary care doctor for further assessment, they might consider getting carotid duplex to look at your blood vessels to ensure there is no narrowing.  They can also help you with blood pressure.  Finally, you can consider seeing cardiology service for your low heart rate.  Return to the ER if you start having one-sided weakness, numbness, slurred speech, constant dizziness with unsteady walk/gait.

## 2020-08-05 NOTE — ED Provider Notes (Signed)
[facility] EMERGENCY DEPARTMENT Provider Note   CSN: [id number] Arrival date & time: 08/05/20  1559     History Chief Complaint  Patient presents with  . Fatigue  . Dizziness    John Spencer is a 70 y.o. male.  HPI    70 year old male with history of diabetes, BPH comes in w/ chief complaint of dizziness and low heart rate.  Patient reports that has been feeling dizzy for the last few days (more than 2 weeks).  The dizziness is intermittent and described as lightheadedness.  Symptoms have no specific evoking, aggravating or relieving factors.  Today went to see a PCP in [place], and his heart rate was in the 50s and he was advised to come to the ER.  Patient denies any chest pain, shortness of breath.  No history of heart attack.  He is new to [place] from [place], we do not have any records from OSH.  Past Medical History:  Diagnosis Date  . BPH (benign prostatic hyperplasia)   . Diabetes mellitus without complication ([place])     There are no problems to display for this patient.   History reviewed. No pertinent surgical history.     No family history on file.  Social History   Tobacco Use  . Smoking status: Former Research scientist (life sciences)  . Smokeless tobacco: Never Used  Substance Use Topics  . Alcohol use: Never  . Drug use: Never    Home Medications Prior to Admission medications   Medication Sig Start Date End Date Taking? Authorizing Provider  rosuvastatin (CRESTOR) 10 MG tablet Take 10 mg by mouth daily.   Yes [provider]  OVER THE COUNTER MEDICATION Take 1 tablet by mouth daily.    [provider]  tamsulosin (FLOMAX) 0.4 MG CAPS capsule Take 0.8 mg by mouth daily. 08/08/19   [provider]    Allergies    Patient has no known allergies.  Review of Systems   Review of Systems  Constitutional: Positive for activity change and fatigue.  Eyes: Negative for visual disturbance.  Respiratory: Negative for  shortness of breath.   Cardiovascular: Negative for chest pain.  Gastrointestinal: Negative for nausea and vomiting.  Neurological: Positive for dizziness.  All other systems reviewed and are negative.   Physical Exam Updated Vital Signs BP (!) 153/74   Pulse (!) 54   Temp 98 F (36.7 C) (Oral)   Resp 16   SpO2 94%   Physical Exam Vitals and nursing note reviewed.  Constitutional:      Appearance: He is well-developed.  HENT:     Head: Atraumatic.  Eyes:     Extraocular Movements: Extraocular movements intact.     Pupils: Pupils are equal, round, and reactive to light.  Cardiovascular:     Rate and Rhythm: Normal rate.  Pulmonary:     Effort: Pulmonary effort is normal.  Musculoskeletal:     Cervical back: Neck supple.  Skin:    General: Skin is warm.  Neurological:     Mental Status: He is alert and oriented to person, place, and time.     Cranial Nerves: No cranial nerve deficit.     Sensory: No sensory deficit.     Motor: No weakness.     Coordination: Coordination normal.     ED Results / Procedures / Treatments   Labs (all labs ordered are listed, but only abnormal results are displayed) Labs Reviewed  COMPREHENSIVE METABOLIC PANEL - Abnormal; Notable for the following  components:      Result Value   AST 13 (*)    All other components within normal limits  CBC WITH DIFFERENTIAL/PLATELET  CBG MONITORING, ED  TROPONIN I (HIGH SENSITIVITY)  TROPONIN I (HIGH SENSITIVITY)    EKG EKG Interpretation  Date/Time:  Monday Aug 05 2020 16:14:31 EDT Ventricular Rate:  66 PR Interval:  184 QRS Duration: 78 QT Interval:  382 QTC Calculation: 400 R Axis:   41 Text Interpretation: Sinus rhythm with occasional Premature ventricular complexes Septal infarct , age undetermined No acute changes TWI in the lateral leads with ST depression No old tracing to compare Confirmed by Varney Biles [phone]) on 08/05/2020 8:20:42 PM     Radiology No results  found.  Procedures Procedures   Medications Ordered in ED Medications  tamsulosin (FLOMAX) capsule 0.4 mg (0.4 mg Oral Given 08/05/20 2108)    ED Course  I have reviewed the triage vital signs and the nursing notes.  Pertinent labs & imaging results that were available during my care of the patient were reviewed by me and considered in my medical decision making (see chart for details).    MDM Rules/Calculators/A&P                          70 year old comes in a chief complaint of dizziness and low heart rate.  His primary complaint is dizziness that has been present intermittently for the last 2 or 3 weeks.  Symptoms are unprovoked and there is no other associated neurologic symptoms such as visual disturbance, slurred speech, one-sided numbness or weakness.  Neuro exam is reassuring.  There is no headache, neck pain and no meningismus.  Of note, patient's heart rate was noted to be in the 50s by PCP.  With his dizziness, they are advised him to come to the ER.  On our telemetry monitoring, patient's heart rate has been as low as 49 and as high as 70.  During my assessment he reported he felt dizzy, but his heart rate was over 60 at that time.  I do not think bradycardia is driving the dizziness.  We ambulated the patient, he reported that he felt a lot better ambulating compared to when laying down.  Unsure what to make of the slow heart rate.  Troponin is negative.  EKG does not have any acute findings, and unfortunately we do not have old EKG to compare.  I think an outpatient cardiology follow-up for bradycardia, if it is something of concern is valid.  Patient is comfortable getting the dizziness work-up completed as an outpatient by his PCP.  Carotid Dopplers can be ordered by them.  This would be helpful especially in the setting of contrast shortage, and my suspicion that patient is not having a true TIA.   Finally, patient's BP has been labile.  He will follow-up with his PCP  about it as well  Final Clinical Impression(s) / ED Diagnoses Final diagnoses:  Bradycardia on ECG  Dizziness    Rx / DC Orders ED Discharge Orders    None       Varney Biles, MD 08/05/20 2322

## 2020-08-05 NOTE — ED Triage Notes (Signed)
Pt here from [place] with reports of dizziness and fatigue for 2-3 weeks. Pt reports the lightheaded/dizziness feeling is getting worse and mostly happens when he is being still. Pt sinus brady at 50 over at [facility] and sent here for further workup.

## 2020-08-05 NOTE — ED Notes (Signed)
Follow up appts reviewed w/ pt. Denies questions or concerns @ this time. Education on s/s of worsening and when to return. Evaluated for low heart rate. Pt ambulated around and orthos performed. Aware cardiology follow up states feels okay to d/c, Left w/ RN in w/c, even and steady gait when walked to car. VSS. Marland Kitchen NAD noted. Marland Kitchen

## 2020-08-05 NOTE — ED Provider Notes (Signed)
Emergency Medicine Provider Triage Evaluation Note  John Spencer , a 70 y.o. male  was evaluated in triage.  Pt complains of feeling dizzy and lightheaded for 2 to 3 weeks with fatigue.  Symptoms improved with playing pickle ball, are worse with rest.  Review of Systems  Positive: Dizziness, lightheaded Negative: Chest pain, shortness of breath  Physical Exam  There were no vitals taken for this visit. Gen:   Awake, no distress   Resp:  Normal effort  MSK:   Moves extremities without difficulty  Other:    Medical Decision Making  Medically screening exam initiated at 4:15 PM.  Appropriate orders placed.  Lason Eveland was informed that the remainder of the evaluation will be completed by another provider, this initial triage assessment does not replace that evaluation, and the importance of remaining in the ED until their evaluation is complete.  Seen at urgent care today, sent to the emergency room for symptoms as above with EKG showing sinus bradycardia with rate of 50.   Tacy Learn, PA-C 08/05/20 1616    Lucrezia Starch, MD 08/06/20 2133

## 2020-08-07 ENCOUNTER — Other Ambulatory Visit (HOSPITAL_COMMUNITY): Payer: Self-pay | Admitting: Family Medicine

## 2020-08-07 ENCOUNTER — Other Ambulatory Visit: Payer: Self-pay | Admitting: Family Medicine

## 2020-08-07 DIAGNOSIS — R42 Dizziness and giddiness: Secondary | ICD-10-CM

## 2020-08-07 DIAGNOSIS — H532 Diplopia: Secondary | ICD-10-CM

## 2020-08-07 NOTE — Progress Notes (Signed)
Date:  08/08/2020   ID:  Waldon Merl, DOB [birth year], MRN [id number]  PCP:  [facility] @ [place]  Cardiologist: Rex Kras, DO, [facility] (established care 08/08/2020)  REASON FOR CONSULT: Bradycardia  REQUESTING PHYSICIAN:  [place], [place] @ [place],  [place] [address]  Chief Complaint  Patient presents with  . Bradycardia  . Shortness of Breath  . New Patient (Initial Visit)    HPI  John Spencer is a 70 y.o. male who presents to the office with a chief complaint of " bradycardia." Patient's past medical history and cardiovascular risk factors include: Established coronary artery disease with prior coronary intervention x4 per patient, prediabetes (per patient), former smoker / marijuana use, advanced age.  He is referred to the office at the request of [facility] for evaluation of bradycardia.  He has been experiencing lightheaded and dizziness at rest and also with ambulation for the last 1 month.  He denies near syncope or syncopal events.  He went to his PCP for further evaluation and management and was noted to have an EKG which noted sinus bradycardia.  Given his symptoms and EKG findings he was referred to [facility] emergency room department for further evaluation and management.  Patient states that when he went to ED they observed him for several hours and later discharged home.  He is now referred to cardiology for further evaluation and management.  Patient states that in the past he was an avid Firefighter but recently has transition to playing pickle ball.  He usually likes playing pickle ball for at least 2 hours 5 to 7 days a week.  However due to feeling tired and fatigued he only plays pickle ball twice a week now.  He is not complaining of decrease in exercise capacity like he did back 5 to 7 years ago when he was living in [place].  Patient states that he was experiencing gradual decrease  in physical endurance with playing tennis and went to have a cardiovascular evaluation.  He subsequently ended up having left heart catheterization and had 4 stents placed within 1 artery.  Patient does not have any additional details with regards to stent information or records for review.  Given his history of coronary artery disease with prior coronary interventions he is not on aspirin 81 mg p.o. daily or statin therapy.  Patient states that he was on these medications for a short period of time and then later discontinued.  Labs performed in the ED notes high sensitive troponin negative x2.  Patient has had an echo and stress test in the past but does not recall when and where they were performed.  With regards to bradycardia patient states that this was an incidental finding.  He does not check his blood pressures or pulse at home.  He denies chest pain, shortness of breath, near-syncope or syncopal events.  He just feels more tired, fatigued than usual.  No known thyroid disease, no history of anemia.  No known AV nodal blocking agents.  FUNCTIONAL STATUS: Currently plays pickle ball couple times a week for approximately 2 hours.  ALLERGIES: No Known Allergies  MEDICATION LIST PRIOR TO VISIT: Current Meds  Medication Sig  . aspirin EC 81 MG tablet Take 1 tablet (81 mg total) by mouth daily. Swallow whole.  . rosuvastatin (CRESTOR) 20 MG tablet Take 1 tablet (20 mg total) by mouth at bedtime.  . tamsulosin (FLOMAX) 0.4 MG CAPS  capsule Take 0.8 mg by mouth daily.     PAST MEDICAL HISTORY: Past Medical History:  Diagnosis Date  . At average risk for colon cancer   . BPH (benign prostatic hyperplasia)   . Diabetes mellitus without complication ([place])   . Heart attack ([place])     PAST SURGICAL HISTORY: Past Surgical History:  Procedure Laterality Date  . APPENDECTOMY    . CAROTID STENT     4    FAMILY HISTORY: The patient family history includes Colon cancer in his sister;  Heart disease in his father and mother; Hypertension in his brother, father, and mother.  SOCIAL HISTORY:  The patient  reports that he quit smoking about 30 years ago. His smoking use included cigarettes. He has a 3.00 pack-year smoking history. He has never used smokeless tobacco. He reports previous drug use. Drug: Marijuana. He reports that he does not drink alcohol.  REVIEW OF SYSTEMS: Review of Systems  Constitutional: Negative for chills and fever.  HENT: Negative for hoarse voice and nosebleeds.   Eyes: Negative for discharge, double vision and pain.  Cardiovascular: Negative for chest pain, claudication, dyspnea on exertion, leg swelling, near-syncope, orthopnea, palpitations, paroxysmal nocturnal dyspnea and syncope.  Respiratory: Negative for hemoptysis and shortness of breath.   Musculoskeletal: Negative for muscle cramps and myalgias.  Gastrointestinal: Negative for abdominal pain, constipation, diarrhea, hematemesis, hematochezia, melena, nausea and vomiting.  Neurological: Positive for dizziness and light-headedness.    PHYSICAL EXAM: Vitals with BMI 08/08/2020 08/05/2020 08/05/2020  Height 5\' 11"  - -  Weight 175 lbs - -  BMI [id number] - -  Systolic [id number]  Diastolic 74 77 74  Pulse 74 55 54   Orthostatic VS for the past 72 hrs (Last 3 readings):  Orthostatic BP Patient Position BP Location Cuff Size Orthostatic Pulse  08/08/20 1452 139/75 Standing Left Arm Normal 72  08/08/20 1450 149/73 Sitting Left Arm Normal 62  08/08/20 1449 138/78 Supine Left Arm Normal 63    CONSTITUTIONAL: Well-developed and well-nourished. No acute distress.  SKIN: Skin is warm and dry. No rash noted. No cyanosis. No pallor. No jaundice.  Vitiligo present HEAD: Normocephalic and atraumatic.  EYES: No scleral icterus MOUTH/THROAT: Moist oral membranes.  NECK: No JVD present. No thyromegaly noted.  Soft bilateral carotid bruits  LYMPHATIC: No visible cervical adenopathy.  CHEST Normal  respiratory effort. No intercostal retractions  LUNGS: Clear to auscultation bilaterally.  No stridor. No wheezes. No rales.  CARDIOVASCULAR: Regular, positive S1-S2, no murmurs rubs or gallops appreciated. ABDOMINAL: Nonobese, soft, nontender, nondistended, positive bowel sounds in all 4 quadrants no apparent ascites.  EXTREMITIES: No peripheral edema  HEMATOLOGIC: No significant bruising NEUROLOGIC: Oriented to person, place, and time. Nonfocal. Normal muscle tone.  PSYCHIATRIC: Normal mood and affect. Normal behavior. Cooperative  CARDIAC DATABASE: EKG: 08/08/2020: Normal sinus rhythm, 67 bpm, poor R wave progression, consider old anteroseptal infarct, without underlying injury pattern.  Echocardiogram: No results found for this or any previous visit from the past 1095 days.   Stress Testing: No results found for this or any previous visit from the past 1095 days.  Heart Catheterization: Atleast 5 years ago in [place]. No records for review.   LABORATORY DATA: CBC Latest Ref Rng & Units 08/05/2020  WBC 4.0 - 10.5 K/uL 7.5  Hemoglobin 13.0 - 17.0 g/dL 15.2  Hematocrit 39.0 - 52.0 % 45.9  Platelets 150 - 400 K/uL 266    CMP Latest Ref Rng & Units 08/05/2020  Glucose 70 -  99 mg/dL 96  BUN 8 - 23 mg/dL 13  Creatinine 0.61 - 1.24 mg/dL 1.07  Sodium 135 - 145 mmol/L 137  Potassium 3.5 - 5.1 mmol/L 3.9  Chloride 98 - 111 mmol/L 105  CO2 22 - 32 mmol/L 25  Calcium 8.9 - 10.3 mg/dL 8.9  Total Protein 6.5 - 8.1 g/dL 7.1  Total Bilirubin 0.3 - 1.2 mg/dL 0.8  Alkaline Phos 38 - 126 U/L 75  AST 15 - 41 U/L 13(L)  ALT 0 - 44 U/L 11    Lipid Panel  No results found for: CHOL, TRIG, HDL, CHOLHDL, VLDL, LDLCALC, LDLDIRECT, LABVLDL  No components found for: NTPROBNP No results for input(s): PROBNP in the last 8760 hours. No results for input(s): TSH in the last 8760 hours.  BMP Recent Labs    08/05/20 1617  NA 137  K 3.9  CL 105  CO2 25  GLUCOSE 96  BUN 13  CREATININE 1.07   CALCIUM 8.9  GFRNONAA >60    HEMOGLOBIN A1C No results found for: HGBA1C, MPG  IMPRESSION:    ICD-10-CM   1. Bradycardia  R00.1 EKG 12-Lead    TSH    LONG TERM MONITOR (3-14 DAYS)  2. Hx of myocardial infarction  I25.2 Lipid Panel With LDL/HDL Ratio    LDL cholesterol, direct    PCV ECHOCARDIOGRAM COMPLETE    PCV CAROTID DUPLEX (BILATERAL)    PCV MYOCARDIAL PERFUSION WO LEXISCAN    aspirin EC 81 MG tablet    rosuvastatin (CRESTOR) 20 MG tablet    B Nat Peptide  3. Atherosclerosis of native coronary artery of native heart without angina pectoris  I25.10 PCV ECHOCARDIOGRAM COMPLETE    PCV CAROTID DUPLEX (BILATERAL)    PCV MYOCARDIAL PERFUSION WO LEXISCAN    aspirin EC 81 MG tablet    rosuvastatin (CRESTOR) 20 MG tablet  4. History of coronary angioplasty with insertion of stent  Z95.5 PCV ECHOCARDIOGRAM COMPLETE    PCV CAROTID DUPLEX (BILATERAL)    PCV MYOCARDIAL PERFUSION WO LEXISCAN    aspirin EC 81 MG tablet    rosuvastatin (CRESTOR) 20 MG tablet  5. Former smoker  Z87.891   67. Lightheadedness  R42 PCV CAROTID DUPLEX (BILATERAL)     RECOMMENDATIONS: John Spencer is a 70 y.o. male whose past medical history and cardiac risk factors include: Established coronary artery disease with prior coronary intervention x4 per patient, prediabetes (per patient), former smoker / marijuana use, advanced age.  Bradycardia: Patient symptoms of lightheadedness, dizziness, generalized tired and fatigue may be secondary to bradycardia. No known reversible causes. No near-syncope or syncopal events. Labs from 08/05/2020 independently reviewed. Check TSH Hemoglobin within normal limits. Plan 14-day extended Holter monitor to evaluate for dysrhythmias, atrial fibrillation, high degree AV block's.  Established coronary artery disease with prior PCI without angina pectoris: Patient states that his symptoms are not anginal equivalent. Currently not on any cardiovascular medications. Given  his history of stenting recommend aspirin 81 mg p.o. daily. Recommend restarting Crestor 20 mg p.o. nightly.  Medication profile discussed. Will obtain outside records Echocardiogram will be ordered to evaluate for structural heart disease and left ventricular systolic function. Plan exercise nuclear stress test to evaluate for functional capacity and exercise-induced ischemia. Check fasting lipid profile.  Lightheaded and dizziness: Orthostatic vital signs negative. The symptoms are not brought on by turning his head positions side to side. Patient does not favor any particular side when he is lightheaded or dizzy.  No other focal neurological deficits.  Will check carotid duplex to evaluate for coronary artery atherosclerosis/stenosis   Former smoker: Educated on the importance of continued smoking cessation.  FINAL MEDICATION LIST END OF ENCOUNTER: Meds ordered this encounter  Medications  . aspirin EC 81 MG tablet    Sig: Take 1 tablet (81 mg total) by mouth daily. Swallow whole.    Dispense:  90 tablet    Refill:  3  . rosuvastatin (CRESTOR) 20 MG tablet    Sig: Take 1 tablet (20 mg total) by mouth at bedtime.    Dispense:  90 tablet    Refill:  0    Medications Discontinued During This Encounter  Medication Reason  . rosuvastatin (CRESTOR) 10 MG tablet Error  . OVER THE COUNTER MEDICATION Error     Current Outpatient Medications:  .  aspirin EC 81 MG tablet, Take 1 tablet (81 mg total) by mouth daily. Swallow whole., Disp: 90 tablet, Rfl: 3 .  rosuvastatin (CRESTOR) 20 MG tablet, Take 1 tablet (20 mg total) by mouth at bedtime., Disp: 90 tablet, Rfl: 0 .  tamsulosin (FLOMAX) 0.4 MG CAPS capsule, Take 0.8 mg by mouth daily., Disp: , Rfl:   Orders Placed This Encounter  Procedures  . TSH  . Lipid Panel With LDL/HDL Ratio  . LDL cholesterol, direct  . B Nat Peptide  . PCV MYOCARDIAL PERFUSION WO LEXISCAN  . LONG TERM MONITOR (3-14 DAYS)  . EKG 12-Lead  . PCV  ECHOCARDIOGRAM COMPLETE  . PCV CAROTID DUPLEX (BILATERAL)    Total encounter time 63 minutes.  *Total Encounter Time as defined by the Centers for Medicare and Medicaid Services includes, in addition to the face-to-face time of a patient visit (documented in the note above) non-face-to-face time: obtaining and reviewing outside history, ordering and reviewing medications, tests or procedures, care coordination (communications with other health care professionals or caregivers), reviewed ER records, obtained PCP records and during the office visit, and documentation in the medical record.  --Continue cardiac medications as reconciled in final medication list. --Return in about 4 weeks (around 09/05/2020) for Follow up bradycardia and review test results . Or sooner if needed. --Continue follow-up with your primary care physician regarding the management of your other chronic comorbid conditions.  Patient's questions and concerns were addressed to his satisfaction. He voices understanding of the instructions provided during this encounter.   This note was created using a voice recognition software as a result there may be grammatical errors inadvertently enclosed that do not reflect the nature of this encounter. Every attempt is made to correct such errors.  Rex Kras, [place], [facility]  Pager: [phone] Office: [phone]

## 2020-08-08 ENCOUNTER — Other Ambulatory Visit: Payer: Self-pay

## 2020-08-08 ENCOUNTER — Ambulatory Visit: Payer: Medicare Other | Admitting: Cardiology

## 2020-08-08 ENCOUNTER — Encounter: Payer: Self-pay | Admitting: Cardiology

## 2020-08-08 VITALS — BP 132/74 | HR 74 | Temp 98.0°F | Resp 16 | Ht 71.0 in | Wt 175.0 lb

## 2020-08-08 DIAGNOSIS — I252 Old myocardial infarction: Secondary | ICD-10-CM

## 2020-08-08 DIAGNOSIS — R42 Dizziness and giddiness: Secondary | ICD-10-CM

## 2020-08-08 DIAGNOSIS — Z87891 Personal history of nicotine dependence: Secondary | ICD-10-CM

## 2020-08-08 DIAGNOSIS — Z955 Presence of coronary angioplasty implant and graft: Secondary | ICD-10-CM

## 2020-08-08 DIAGNOSIS — R001 Bradycardia, unspecified: Secondary | ICD-10-CM

## 2020-08-08 DIAGNOSIS — I2510 Atherosclerotic heart disease of native coronary artery without angina pectoris: Secondary | ICD-10-CM

## 2020-08-08 MED ORDER — ASPIRIN EC 81 MG PO TBEC: 81.0000 mg | DELAYED_RELEASE_TABLET | Freq: Every day | ORAL | 3 refills | Status: DC

## 2020-08-08 MED ORDER — ROSUVASTATIN CALCIUM 20 MG PO TABS: 20.0000 mg | ORAL_TABLET | Freq: Every day | ORAL | 0 refills | Status: DC

## 2020-08-10 LAB — TSH: TSH: 1.5 u[IU]/mL (ref 0.450–4.500)

## 2020-08-10 LAB — LIPID PANEL WITH LDL/HDL RATIO
Cholesterol, Total: 211 mg/dL — ABNORMAL HIGH (ref 100–199)
HDL: 49 mg/dL (ref >39)
LDL Chol Calc (NIH): 145 mg/dL — ABNORMAL HIGH (ref 0–99)
LDL/HDL Ratio: 3 ratio (ref 0.0–3.6)
Triglycerides: 93 mg/dL (ref 0–149)
VLDL Cholesterol Cal: 17 mg/dL (ref 5–40)

## 2020-08-10 LAB — LDL CHOLESTEROL, DIRECT: LDL Direct: 146 mg/dL — ABNORMAL HIGH (ref 0–99)

## 2020-08-10 LAB — BRAIN NATRIURETIC PEPTIDE: BNP: 17.8 pg/mL (ref 0.0–100.0)

## 2020-08-13 NOTE — Progress Notes (Signed)
No answer left a vm to call back

## 2020-08-14 NOTE — Progress Notes (Signed)
Attempted to call pt, no answer. Left vm requesting call back.

## 2020-08-14 NOTE — Progress Notes (Signed)
Called and spoke with pt regarding results.

## 2020-08-20 ENCOUNTER — Other Ambulatory Visit: Payer: Self-pay

## 2020-08-20 ENCOUNTER — Ambulatory Visit (HOSPITAL_COMMUNITY)
Admission: RE | Admit: 2020-08-20 | Discharge: 2020-08-20 | Disposition: A | Discharge status: Home / Self Care | Payer: Medicare Other | Source: Ambulatory Visit | Attending: Family Medicine | Admitting: Family Medicine

## 2020-08-20 DIAGNOSIS — R42 Dizziness and giddiness: Secondary | ICD-10-CM | POA: Insufficient documentation

## 2020-08-20 DIAGNOSIS — H532 Diplopia: Secondary | ICD-10-CM | POA: Insufficient documentation

## 2020-08-20 IMAGING — MR MR HEAD W/O CM
12 of 14 series · 35 of 48 positions shown · non-contrast
Comparison: No pertinent prior exam.
COMPARISON: None available.

CLINICAL DATA: Initial evaluation for dizziness, double vision,
history of bradycardia.

EXAM:
MRI HEAD WITHOUT CONTRAST
MRA HEAD WITHOUT CONTRAST
TECHNIQUE: Multiplanar, multi-echo pulse sequences of the brain and surrounding
structures were acquired without intravenous contrast. Angiographic
images of the Circle of Willis were acquired using MRA technique
without intravenous contrast.

[Series 5: DWI · axial · 3.0mm · 0.88mm/px · z∈[-80,+63]mm · 7 of 98 slices shown (1 of 4)]
[im 1/98]
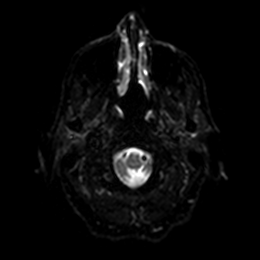
[im 17/98]
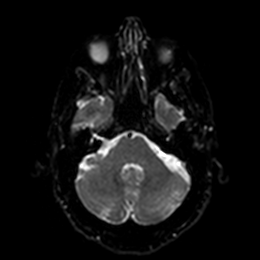
[im 33/98]
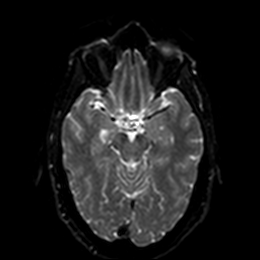
[im 49/98]
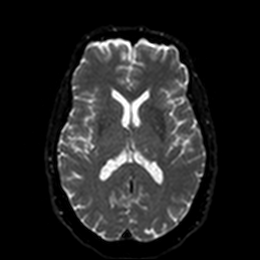
[im 65/98]
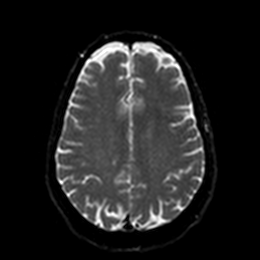
[im 81/98]
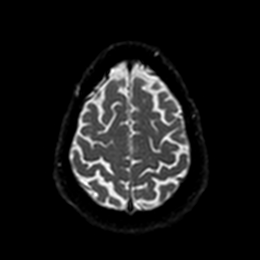
[im 98/98]
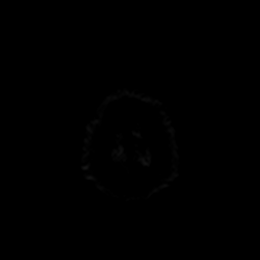

[Series 6: DWI · axial · 3.0mm · 0.88mm/px · z∈[-80,+63]mm · 3 of 49 slices shown (2 of 4)]
[im 1/49]
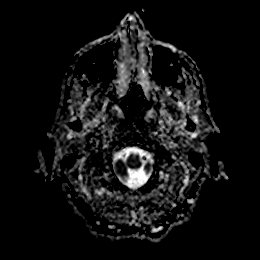
[im 25/49]
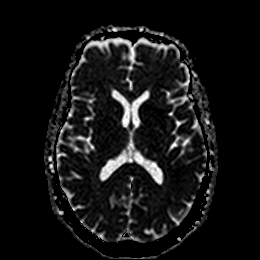
[im 49/49]
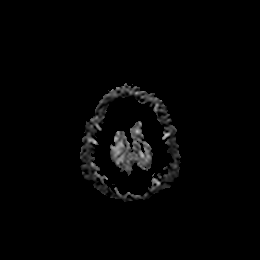

[Series 7: DWI · coronal · 4.0mm · 0.88mm/px · 4 of 72 slices shown (3 of 4)]
[im 1/72]
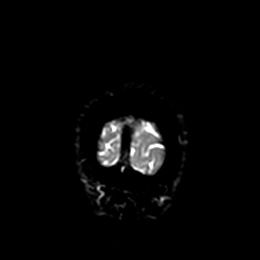
[im 24/72]
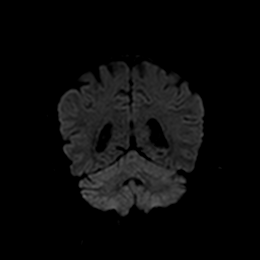
[im 48/72]
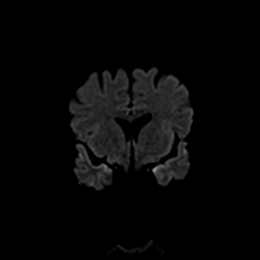
[im 72/72]
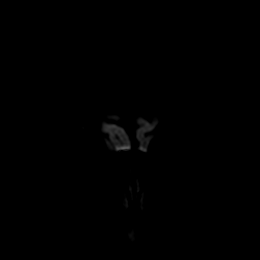

[Series 8: DWI · coronal · 4.0mm · 0.88mm/px · 2 of 36 slices shown (4 of 4)]
[im 1/36]
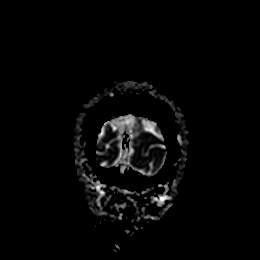
[im 36/36]
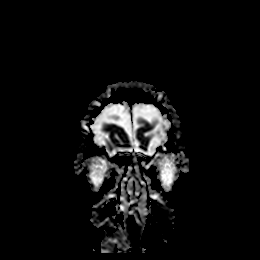

[Series 13: T1 · sagittal · 5.0mm · 0.75mm/px · 1 of 23 slices shown]
[im 1/23]
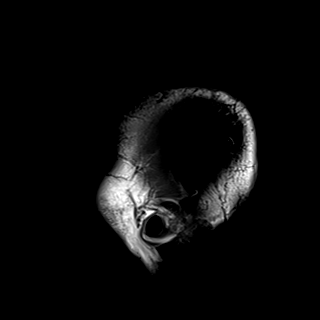

[Series 14: T2 · axial · 5.0mm · 0.72mm/px · z∈[-88,+61]mm · 2 of 26 slices shown (1 of 2)]
[im 1/26]
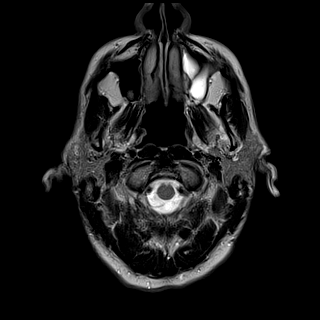
[im 26/26]
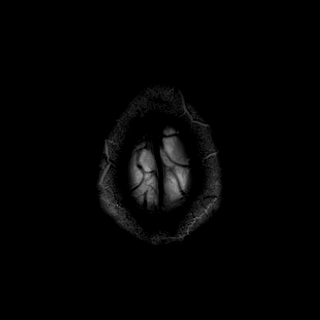

[Series 15: FLAIR · axial · 5.0mm · 0.45mm/px · z∈[-87,+62]mm · 2 of 26 slices shown]
[im 1/26]
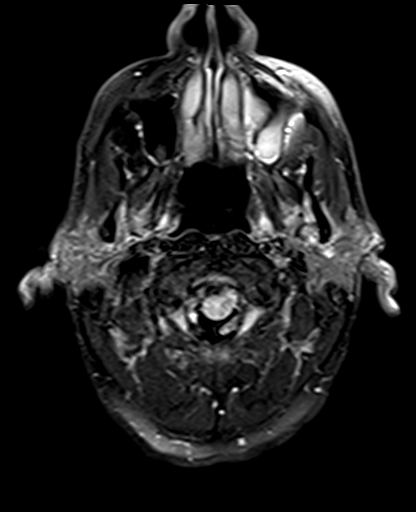
[im 26/26]
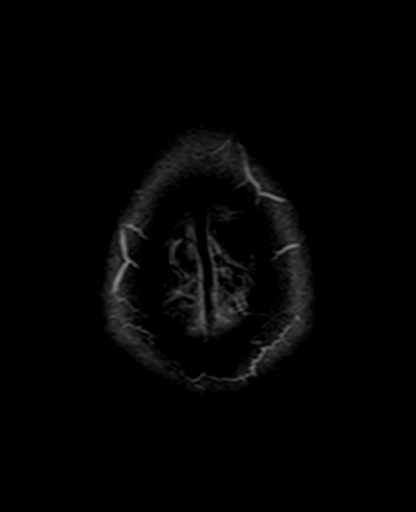

[Series 16: mag_images · axial · 3.0mm · 0.90mm/px · z∈[-96,+80]mm · 3 of 60 slices shown]
[im 1/60]
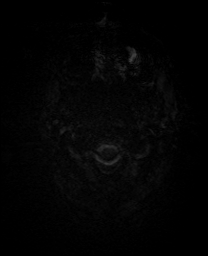
[im 30/60]
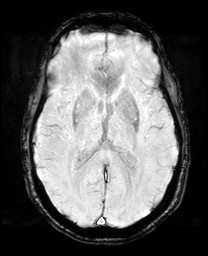
[im 60/60]
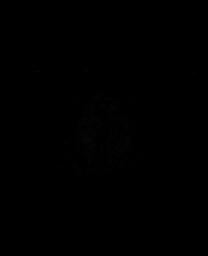

[Series 17: pha_images · axial · 3.0mm · 0.90mm/px · z∈[-96,+80]mm · 3 of 60 slices shown]
[im 1/60]
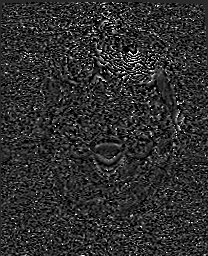
[im 30/60]
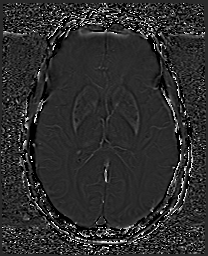
[im 60/60]
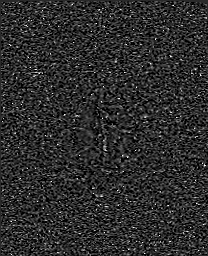

[Series 18: swi_images · axial · 3.0mm · 0.90mm/px · z∈[-96,+80]mm · 3 of 60 slices shown]
[im 1/60]
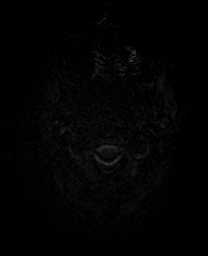
[im 30/60]
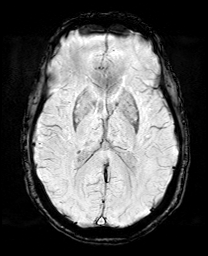
[im 60/60]
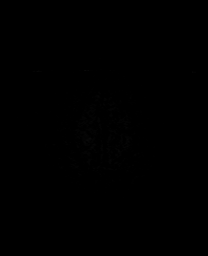

[Series 19: mip_images(sw) · axial · 24.0mm · 0.90mm/px · z∈[-86,+70]mm · 3 of 53 slices shown]
[im 1/53]
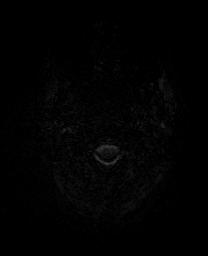
[im 27/53]
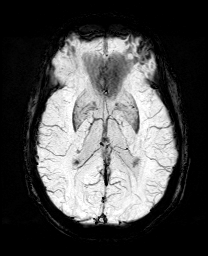
[im 53/53]
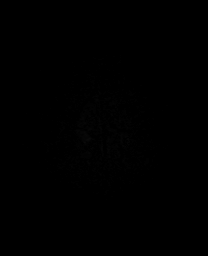

[Series 21: T2 · coronal · 5.0mm · 0.34mm/px · 2 of 29 slices shown (2 of 2)]
[im 1/29]
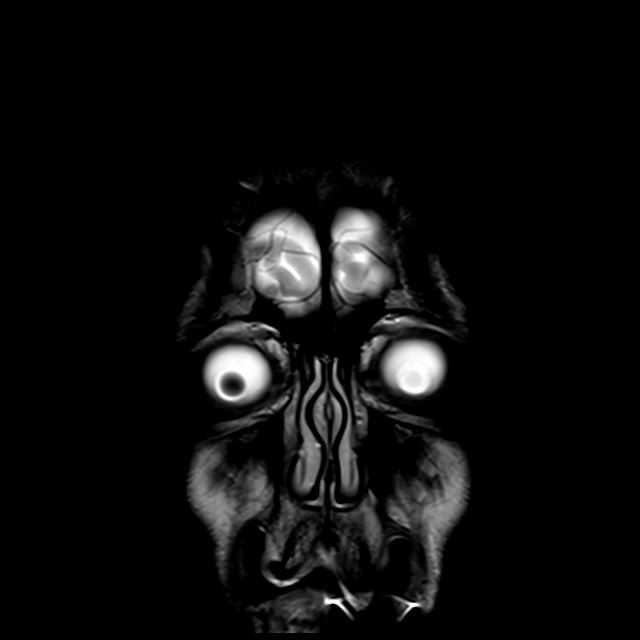
[im 29/29]
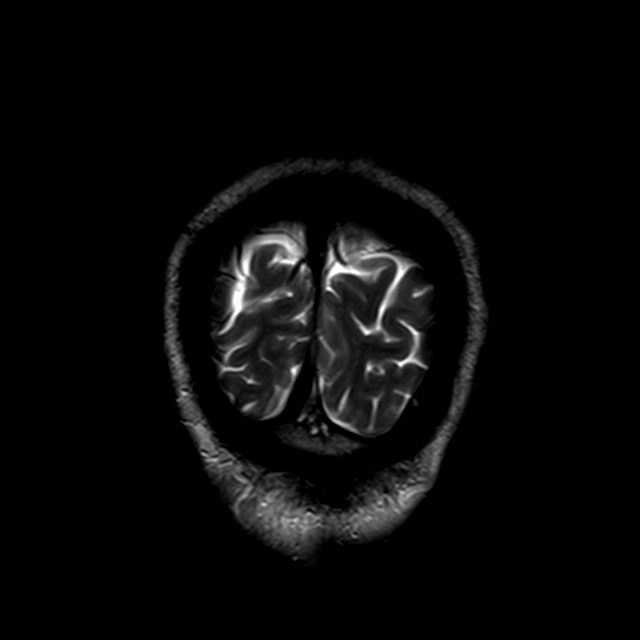

[35 of 48 positions shown; findings below may reference images not displayed]

FINDINGS: MRI HEAD FINDINGS

Brain: Cerebral volume within normal limits for age. Few scattered
subcentimeter foci of T2/FLAIR hyperintensity noted involving
supratentorial cerebral white matter, nonspecific, but overall
extremely mild in nature, and felt to be within normal limits for
age. Single 7 mm focus of FLAIR signal abnormality involving the
parasagittal cortex of the right parieto-occipital region could
reflect a tiny remote cortical infarct (series 15, image 16).

No abnormal foci of restricted diffusion to suggest acute or
subacute ischemia. Gray-white matter differentiation otherwise
maintained. No other areas of remote cortical infarction. No
evidence for acute or chronic intracranial hemorrhage.

No mass lesion, midline shift or mass effect. No hydrocephalus or
extra-axial fluid collection. Pituitary gland suprasellar region
within normal limits. Midline structures intact and normal.

Vascular: Major intracranial vascular flow voids are well
maintained.

Skull and upper cervical spine: Craniocervical junction within
normal limits. Bone marrow signal intensity normal. No scalp soft
tissue abnormality.

Sinuses/Orbits: Patient status post ocular lens replacement on the
left. Globes and orbital soft tissues demonstrate no acute finding.
Scattered mucosal thickening noted within the visualized left
maxillary sinus. Paranasal sinuses are otherwise clear. No
significant mastoid effusion. Inner ear structures grossly normal.

Other: None.

MRA HEAD FINDINGS

Anterior circulation: Visualized distal cervical segments of the
internal carotid arteries are widely patent with antegrade flow.
Petrous segments patent bilaterally. Atheromatous irregularity
throughout the carotid siphons without hemodynamically significant
stenosis. A1 segments patent bilaterally. Normal anterior
communicating artery complex. Anterior cerebral arteries patent to
their distal aspects without stenosis. No M1 stenosis or occlusion.
Normal MCA bifurcations. Distal MCA branches well perfused and
symmetric.

Posterior circulation: Both V4 segments patent to the
vertebrobasilar junction without stenosis. Left vertebral artery
dominant. Both PICA origins patent and normal. Basilar patent to its
distal aspect without stenosis. Superior cerebellar arteries patent
bilaterally. Both PCA supplied via the basilar as well as small
bilateral posterior communicating arteries. PCAs well perfused to
their distal aspects without stenosis.

No intracranial aneurysm or other vascular abnormality.
IMPRESSION: MRI HEAD IMPRESSION:

1. No acute intracranial abnormality.
2. Subtle 7 mm focus of FLAIR signal abnormality involving the
parasagittal right parieto-occipital cortex, which could reflect a
tiny remote cortical infarct.
3. Otherwise normal brain MRI for age.

MRA HEAD IMPRESSION:

Negative intracranial MRA. No large vessel occlusion,
hemodynamically significant stenosis, or other vascular abnormality.

## 2020-08-21 ENCOUNTER — Ambulatory Visit: Payer: Medicare Other

## 2020-08-21 DIAGNOSIS — I2510 Atherosclerotic heart disease of native coronary artery without angina pectoris: Secondary | ICD-10-CM

## 2020-08-21 DIAGNOSIS — I6522 Occlusion and stenosis of left carotid artery: Secondary | ICD-10-CM

## 2020-08-21 DIAGNOSIS — Z955 Presence of coronary angioplasty implant and graft: Secondary | ICD-10-CM

## 2020-08-21 DIAGNOSIS — I252 Old myocardial infarction: Secondary | ICD-10-CM

## 2020-08-21 DIAGNOSIS — R42 Dizziness and giddiness: Secondary | ICD-10-CM

## 2020-08-27 NOTE — Progress Notes (Signed)
Called and spoke with patient regarding his CAD results. FU Appointment date and time confirmed.

## 2020-08-27 NOTE — Progress Notes (Signed)
Called and spoke with patient regarding his CAD results. Patient appointment confirmed date and time.

## 2020-08-28 ENCOUNTER — Inpatient Hospital Stay: Payer: Medicare Other

## 2020-08-28 ENCOUNTER — Other Ambulatory Visit: Payer: Self-pay

## 2020-08-28 ENCOUNTER — Ambulatory Visit: Payer: Medicare Other

## 2020-08-28 DIAGNOSIS — I2510 Atherosclerotic heart disease of native coronary artery without angina pectoris: Secondary | ICD-10-CM

## 2020-08-28 DIAGNOSIS — I252 Old myocardial infarction: Secondary | ICD-10-CM

## 2020-08-28 DIAGNOSIS — R001 Bradycardia, unspecified: Secondary | ICD-10-CM

## 2020-08-28 DIAGNOSIS — Z955 Presence of coronary angioplasty implant and graft: Secondary | ICD-10-CM

## 2020-08-30 NOTE — Progress Notes (Signed)
Called patient, NA, LMAM

## 2020-08-30 NOTE — Progress Notes (Signed)
Pt called back. Spoke to him regarding stress test results.

## 2020-09-16 ENCOUNTER — Ambulatory Visit: Payer: Medicare Other | Admitting: Cardiology

## 2020-10-10 ENCOUNTER — Other Ambulatory Visit: Payer: Self-pay

## 2020-10-10 ENCOUNTER — Ambulatory Visit: Payer: Medicare Other | Admitting: Cardiology

## 2020-10-10 ENCOUNTER — Encounter: Payer: Self-pay | Admitting: Cardiology

## 2020-10-10 VITALS — BP 132/75 | HR 80 | Temp 98.3°F | Resp 16 | Ht 71.0 in | Wt 173.0 lb

## 2020-10-10 DIAGNOSIS — I252 Old myocardial infarction: Secondary | ICD-10-CM

## 2020-10-10 DIAGNOSIS — Z87891 Personal history of nicotine dependence: Secondary | ICD-10-CM

## 2020-10-10 DIAGNOSIS — I6522 Occlusion and stenosis of left carotid artery: Secondary | ICD-10-CM

## 2020-10-10 DIAGNOSIS — Z955 Presence of coronary angioplasty implant and graft: Secondary | ICD-10-CM

## 2020-10-10 DIAGNOSIS — R001 Bradycardia, unspecified: Secondary | ICD-10-CM

## 2020-10-10 DIAGNOSIS — R42 Dizziness and giddiness: Secondary | ICD-10-CM

## 2020-10-10 DIAGNOSIS — I2510 Atherosclerotic heart disease of native coronary artery without angina pectoris: Secondary | ICD-10-CM

## 2020-10-10 DIAGNOSIS — E7800 Pure hypercholesterolemia, unspecified: Secondary | ICD-10-CM

## 2020-10-10 NOTE — Progress Notes (Signed)
Date:  10/10/2020   ID:  John Spencer, DOB 1950-11-29, MRN [id number]  PCP:  [facility] @ [place]  Cardiologist: Rex Kras, DO, [facility] (established care 08/08/2020)  Date: 10/10/20 Last Office Visit: 08/08/2020  Chief Complaint  Patient presents with   Bradycardia   Results   Follow-up    HPI  John Spencer is a 70 y.o. male who presents to the office with a chief complaint of " follow-up for bradycardia and review test results." Patient's past medical history and cardiovascular risk factors include: Established coronary artery disease with prior coronary intervention x4 per patient, asymptomatic left internal carotid artery stenosis, prediabetes (per patient), former smoker / marijuana use, advanced age.  He is referred to the office at the request of [facility], [facility] for evaluation of bradycardia.  Patient was referred to the office for evaluation of bradycardia after being seen by primary team as well as ED at [facility].  Given his history of coronary artery disease and prior PCI's and not being on GDMT there was concern that he may have progressive CAD.  At the last office visit we discussed undergoing an echocardiogram, stress testing, and a 14-day extended Holter monitor.  Results of the echocardiogram and stress test reviewed with him in great detail at today's office visit and results noted below for further reference.  14-day extended Holter monitor noted an average heart rate of 61 bpm without any significant dysrhythmias.  Patient states that he has stopped taking his tamsulosin for BPH and his symptoms of lightheaded, dizziness, have completely resolved.  On physical examination patient was found to have a carotid bruit and underwent a carotid duplex which is notable for left internal carotid artery stenosis less than 50%.  In addition, he had a fasting lipid profile which noted a direct LDL level of 146 mg/dL.  Since then he is been  started on Crestor 20 mg p.o. nightly.  He is tolerating the medication well without any side effects or intolerances  FUNCTIONAL STATUS: Currently plays pickle ball couple times a week for approximately 2 hours.  ALLERGIES: No Known Allergies  MEDICATION LIST PRIOR TO VISIT: Current Meds  Medication Sig   aspirin EC 81 MG tablet Take 1 tablet (81 mg total) by mouth daily. Swallow whole.   rosuvastatin (CRESTOR) 20 MG tablet Take 1 tablet (20 mg total) by mouth at bedtime.   tamsulosin (FLOMAX) 0.4 MG CAPS capsule Take 0.8 mg by mouth daily.     PAST MEDICAL HISTORY: Past Medical History:  Diagnosis Date   At average risk for colon cancer    BPH (benign prostatic hyperplasia)    Coronary artery disease    Prior PCI x4, per patient.   Diabetes mellitus without complication ([place])    Heart attack ([place])    Internal carotid artery stenosis, left     PAST SURGICAL HISTORY: Past Surgical History:  Procedure Laterality Date   APPENDECTOMY      FAMILY HISTORY: The patient family history includes Colon cancer in his sister; Heart disease in his father and mother; Hypertension in his brother, father, and mother.  SOCIAL HISTORY:  The patient  reports that he quit smoking about 30 years ago. His smoking use included cigarettes. He has a 3.00 pack-year smoking history. He has never used smokeless tobacco. He reports previous drug use. Drug: Marijuana. He reports that he does not drink alcohol.  REVIEW OF SYSTEMS: Review of Systems  Constitutional: Negative for chills and fever.  HENT:  Negative for hoarse voice and nosebleeds.   Eyes:  Negative for discharge, double vision and pain.  Cardiovascular:  Negative for chest pain, claudication, dyspnea on exertion, leg swelling, near-syncope, orthopnea, palpitations, paroxysmal nocturnal dyspnea and syncope.  Respiratory:  Negative for hemoptysis and shortness of breath.   Musculoskeletal:  Negative for muscle cramps and myalgias.   Gastrointestinal:  Negative for abdominal pain, constipation, diarrhea, hematemesis, hematochezia, melena, nausea and vomiting.  Neurological:  Negative for dizziness and light-headedness.   PHYSICAL EXAM: Vitals with BMI 10/10/2020 08/08/2020 08/05/2020  Height '5\' 11"'  '5\' 11"'  -  Weight 173 lbs 175 lbs -  BMI 08.67 61.95 -  Systolic 093 267 124  Diastolic 75 74 77  Pulse 80 74 55   CONSTITUTIONAL: Well-developed and well-nourished. No acute distress.  SKIN: Skin is warm and dry. No rash noted. No cyanosis. No pallor. No jaundice.  Vitiligo present HEAD: Normocephalic and atraumatic.  EYES: No scleral icterus MOUTH/THROAT: Moist oral membranes.  NECK: No JVD present. No thyromegaly noted.  Soft bilateral carotid bruits  LYMPHATIC: No visible cervical adenopathy.  CHEST Normal respiratory effort. No intercostal retractions  LUNGS: Clear to auscultation bilaterally.  No stridor. No wheezes. No rales.  CARDIOVASCULAR: Regular, positive S1-S2, no murmurs rubs or gallops appreciated. ABDOMINAL: Nonobese, soft, nontender, nondistended, positive bowel sounds in all 4 quadrants no apparent ascites.  EXTREMITIES: No peripheral edema  HEMATOLOGIC: No significant bruising NEUROLOGIC: Oriented to person, place, and time. Nonfocal. Normal muscle tone.  PSYCHIATRIC: Normal mood and affect. Normal behavior. Cooperative  CARDIAC DATABASE: EKG: 08/08/2020: Normal sinus rhythm, 67 bpm, poor R wave progression, consider old anteroseptal infarct, without underlying injury pattern.  Echocardiogram: 08/21/2020: Normal LV systolic function with visual EF 55-60%. Left ventricle cavity is normal in size. Mild left ventricular hypertrophy. Normal global wall motion. Indeterminate diastolic filling pattern, normal LAP.  Left atrial cavity is mildly dilated. Mild (Grade I) mitral regurgitation. The aortic root is dilated, sinotubular junction 3.8cm. IVC is normal with a respiratory response of <50%. No prior  study for comparison.   Stress Testing: Exercise Myoview stress test 08/28/2020: 1 Day Rest/Stress Protocol. Exercise 9 minutes 1 second on Bruce protocol, achieved 10.17 METS, and 85% of age-predicted maximum heart rate. Stress ECG negative for ischemia.  Normal myocardial perfusion without convincing evidence of reversible myocardial ischemia or prior infarct. Left ventricular wall thickness is preserved without regional wall motion abnormalities. LVEF 51%, visually appears to be preserved No prior studies for comparison. Low risk study.  Heart Catheterization: Atleast 5 years ago in [place]. No records for review.   Carotid artery duplex 08/21/2020: Doppler velocity suggests stenosis in the right internal carotid artery (minimal). Doppler velocity suggests stenosis in the left internal carotid artery (16-49%). Lower limit of spectrum. Stenosis in the left external carotid artery (<50%). Antegrade right vertebral artery flow.  Follow up in one year is appropriate if clinically indicated.  14 day extended Holter monitor: Dominant rhythm normal sinus. Heart rate 41-146 bpm. Avg HR 61 bpm. No atrial fibrillation, ventricular tachycardia, high grade AV block, pauses (3 seconds or longer). Total ventricular ectopic burden 3.3%. Total supraventricular ectopic burden <1%. Patient triggered events: 11. Underlying rhythm normal sinus with frequent PVCs.  LABORATORY DATA: CBC Latest Ref Rng & Units 08/05/2020  WBC 4.0 - 10.5 K/uL 7.5  Hemoglobin 13.0 - 17.0 g/dL 15.2  Hematocrit 39.0 - 52.0 % 45.9  Platelets 150 - 400 K/uL 266    CMP Latest Ref Rng & Units 08/05/2020  Glucose 70 - 99 mg/dL 96  BUN 8 - 23 mg/dL 13  Creatinine 0.61 - 1.24 mg/dL 1.07  Sodium 135 - 145 mmol/L 137  Potassium 3.5 - 5.1 mmol/L 3.9  Chloride 98 - 111 mmol/L 105  CO2 22 - 32 mmol/L 25  Calcium 8.9 - 10.3 mg/dL 8.9  Total Protein 6.5 - 8.1 g/dL 7.1  Total Bilirubin 0.3 - 1.2 mg/dL 0.8  Alkaline Phos 38 - 126 U/L  75  AST 15 - 41 U/L 13(L)  ALT 0 - 44 U/L 11    Lipid Panel     Component Value Date/Time   CHOL 211 (H) 08/09/2020 0833   TRIG 93 08/09/2020 0833   HDL 49 08/09/2020 0833   LDLCALC 145 (H) 08/09/2020 0833   LDLDIRECT 146 (H) 08/09/2020 0833   LABVLDL 17 08/09/2020 0833    No components found for: NTPROBNP No results for input(s): PROBNP in the last 8760 hours. Recent Labs    08/09/20 0833  TSH 1.500    BMP Recent Labs    08/05/20 1617  NA 137  K 3.9  CL 105  CO2 25  GLUCOSE 96  BUN 13  CREATININE 1.07  CALCIUM 8.9  GFRNONAA >60    HEMOGLOBIN A1C No results found for: HGBA1C, MPG  IMPRESSION:    ICD-10-CM   1. Bradycardia  R00.1     2. Hx of myocardial infarction  I25.2     3. Atherosclerosis of native coronary artery of native heart without angina pectoris  I25.10 Lipid Panel With LDL/HDL Ratio    CMP14+EGFR    LDL cholesterol, direct    4. History of coronary angioplasty with insertion of stent  Z95.5     5. Hypercholesterolemia  E78.00 Lipid Panel With LDL/HDL Ratio    CMP14+EGFR    LDL cholesterol, direct    6. Former smoker  Z87.891     79. Lightheadedness  R42     8. Internal carotid artery stenosis, left  I65.22 Lipid Panel With LDL/HDL Ratio    CMP14+EGFR    LDL cholesterol, direct    PCV CAROTID DUPLEX (BILATERAL)       RECOMMENDATIONS: John Spencer is a 70 y.o. male whose past medical history and cardiac risk factors include: Established coronary artery disease with prior coronary intervention x4 per patient, asymptomatic left internal carotid artery stenosis, prediabetes (per patient), former smoker / marijuana use, advanced age.  Bradycardia: Resolved. 14-day extended Holter monitor notes an average heart rate of 61 bpm without any underlying dysrhythmias. No identifiable reversible cause. TSH within normal limits.  Monitor for now.  Established coronary artery disease with prior PCI without angina pectoris: Did not receive  outside records that further define his coronary anatomy and prior coronary interventions.   Most recent lipid profile reviewed. Currently on rosuvastatin  Start aspirin 81 mg p.o. daily  Results of the echocardiogram and stress test reviewed with the patient and noted above for further reference.    Asymptomatic left carotid artery stenosis: Started on statin therapy after the most recent lipid profile back in May 2022 Start aspirin 81 mg p.o. daily Repeat carotid duplex in 1 year to reevaluate disease burden  Pure hypercholesterolemia: Most recent lipid profile 08/09/2020 reviewed. Patient is LDL 146 mg/dL. Started on Crestor back in May 2022. Will recheck fasting lipid profile to evaluate pharmacological therapy to see if additional medication titration is warranted.  Lightheaded and dizziness: Medication induced.  Symptoms have resolved after stopping tamsulosin. I have encouraged  him to discuss BPH management with his primary care provider as he has stopped tamsulosin.  Former smoker: Educated on the importance of continued smoking cessation.  FINAL MEDICATION LIST END OF ENCOUNTER: No orders of the defined types were placed in this encounter.   There are no discontinued medications.    Current Outpatient Medications:    aspirin EC 81 MG tablet, Take 1 tablet (81 mg total) by mouth daily. Swallow whole., Disp: 90 tablet, Rfl: 3   rosuvastatin (CRESTOR) 20 MG tablet, Take 1 tablet (20 mg total) by mouth at bedtime., Disp: 90 tablet, Rfl: 0   tamsulosin (FLOMAX) 0.4 MG CAPS capsule, Take 0.8 mg by mouth daily., Disp: , Rfl:   Orders Placed This Encounter  Procedures   Lipid Panel With LDL/HDL Ratio   CMP14+EGFR   LDL cholesterol, direct   PCV CAROTID DUPLEX (BILATERAL)   --Continue cardiac medications as reconciled in final medication list. --Return in about 6 months (around 04/12/2021) for Follow up, CAD, Lipid. Or sooner if needed. --Continue follow-up with your primary  care physician regarding the management of your other chronic comorbid conditions.  Patient's questions and concerns were addressed to his satisfaction. He voices understanding of the instructions provided during this encounter.   This note was created using a voice recognition software as a result there may be grammatical errors inadvertently enclosed that do not reflect the nature of this encounter. Every attempt is made to correct such errors.  Rex Kras, [place], [facility]  Pager: [phone] Office: [phone]

## 2020-10-18 ENCOUNTER — Other Ambulatory Visit: Payer: Self-pay

## 2020-10-18 ENCOUNTER — Emergency Department (HOSPITAL_COMMUNITY)
Admission: EM | Admit: 2020-10-18 | Discharge: 2020-10-19 | Disposition: A | Discharge status: Home / Self Care | Payer: Medicare Other | Attending: Emergency Medicine | Admitting: Emergency Medicine

## 2020-10-18 DIAGNOSIS — T83098A Other mechanical complication of other indwelling urethral catheter, initial encounter: Secondary | ICD-10-CM | POA: Insufficient documentation

## 2020-10-18 DIAGNOSIS — E119 Type 2 diabetes mellitus without complications: Secondary | ICD-10-CM | POA: Insufficient documentation

## 2020-10-18 DIAGNOSIS — Z466 Encounter for fitting and adjustment of urinary device: Secondary | ICD-10-CM | POA: Diagnosis not present

## 2020-10-18 DIAGNOSIS — I2510 Atherosclerotic heart disease of native coronary artery without angina pectoris: Secondary | ICD-10-CM | POA: Diagnosis not present

## 2020-10-18 DIAGNOSIS — Z87891 Personal history of nicotine dependence: Secondary | ICD-10-CM | POA: Diagnosis not present

## 2020-10-18 DIAGNOSIS — Z85038 Personal history of other malignant neoplasm of large intestine: Secondary | ICD-10-CM | POA: Diagnosis not present

## 2020-10-18 DIAGNOSIS — Z7982 Long term (current) use of aspirin: Secondary | ICD-10-CM | POA: Insufficient documentation

## 2020-10-18 DIAGNOSIS — R339 Retention of urine, unspecified: Secondary | ICD-10-CM | POA: Diagnosis not present

## 2020-10-18 LAB — URINALYSIS, ROUTINE W REFLEX MICROSCOPIC
Bacteria, UA: NONE SEEN
Bilirubin Urine: NEGATIVE
Glucose, UA: NEGATIVE mg/dL
Ketones, ur: 5 mg/dL — AB
Leukocytes,Ua: NEGATIVE
Nitrite: NEGATIVE
Protein, ur: NEGATIVE mg/dL
Specific Gravity, Urine: 1.012 (ref 1.005–1.030)
pH: 5 (ref 5.0–8.0)

## 2020-10-18 MED ORDER — LIDOCAINE HCL URETHRAL/MUCOSAL 2 % EX GEL
1.0000 "application " | Freq: Once | CUTANEOUS | Status: AC
  Administered 2020-10-18: 1 via URETHRAL
  Filled 2020-10-18: qty 11

## 2020-10-18 NOTE — ED Notes (Signed)
Bladder contents from bladder scan 560m

## 2020-10-18 NOTE — ED Triage Notes (Signed)
[facility] from home. Hx enlarged prostate. Pt had biopsy of prostate today at [facility]. Pt was sent home without a catheter or pain medication. Pt complaining of 10/10 pain and no urinary output since 6PM.

## 2020-10-18 NOTE — ED Provider Notes (Signed)
[place] DEPT Provider Note   CSN: [id number] Arrival date & time: 10/18/20  2224     History Chief Complaint  Patient presents with   Urinary Retention     John Spencer is a 70 y.o. male.  The history is provided by the patient and medical records.   70 year old male with history of BPH, diabetes, presenting to the ED with urinary retention.  Reports he had a prostate biopsy earlier today at [facility] due to elevated PSA.  States procedure went fine as far as he knows, he was able to urinate a small amount after procedure which was grossly bloody but he has not had any significant urinary output since that time.  He reports feeling "sore".  Reports he has the urge to urinate but unable to pass any urine despite straining.  He has required Foley catheter before in the past.  Denies any fever.  He is currently on ciprofloxacin for infectious prophylaxis.  Past Medical History:  Diagnosis Date   At average risk for colon cancer    BPH (benign prostatic hyperplasia)    Coronary artery disease    Prior PCI x4, per patient.   Diabetes mellitus without complication ([place])    Heart attack ([place])    Internal carotid artery stenosis, left     There are no problems to display for this patient.   Past Surgical History:  Procedure Laterality Date   APPENDECTOMY         Family History  Problem Relation Age of Onset   Heart disease Mother    Hypertension Mother    Heart disease Father    Hypertension Father    Colon cancer Sister    Hypertension Brother     Social History   Tobacco Use   Smoking status: Former    Packs/day: 1.00    Years: 3.00    Pack years: 3.00    Types: Cigarettes    Quit date: 1992    Years since quitting: 30.5   Smokeless tobacco: Never  Vaping Use   Vaping Use: Never used  Substance Use Topics   Alcohol use: Never   Drug use: Not Currently    Types: Marijuana    Home Medications Prior to Admission medications    Medication Sig Start Date End Date Taking? Authorizing Provider  aspirin EC 81 MG tablet Take 1 tablet (81 mg total) by mouth daily. Swallow whole. 08/08/20   Tolia, Sunit, DO  rosuvastatin (CRESTOR) 20 MG tablet Take 1 tablet (20 mg total) by mouth at bedtime. 08/08/20 11/06/20  Tolia, Sunit, DO  tamsulosin (FLOMAX) 0.4 MG CAPS capsule Take 0.8 mg by mouth daily. 08/08/19   [provider]    Allergies    Patient has no known allergies.  Review of Systems   Review of Systems  Genitourinary:  Positive for difficulty urinating.  All other systems reviewed and are negative.  Physical Exam Updated Vital Signs BP (!) 124/59 (BP Location: Left Arm)   Pulse 84   Temp 98.2 F (36.8 C) (Oral)   Resp 19   SpO2 92%   Physical Exam Vitals and nursing note reviewed.  Constitutional:      Appearance: He is well-developed.  HENT:     Head: Normocephalic and atraumatic.  Eyes:     Conjunctiva/sclera: Conjunctivae normal.     Pupils: Pupils are equal, round, and reactive to light.  Cardiovascular:     Rate and Rhythm: Normal rate and regular rhythm.  Heart sounds: Normal heart sounds.  Pulmonary:     Effort: Pulmonary effort is normal. No respiratory distress.     Breath sounds: Normal breath sounds. No rhonchi.  Abdominal:     General: Bowel sounds are normal.     Palpations: Abdomen is soft.     Tenderness: There is no abdominal tenderness. There is no rebound.  Musculoskeletal:        General: Normal range of motion.     Cervical back: Normal range of motion.  Skin:    General: Skin is warm and dry.  Neurological:     Mental Status: He is alert and oriented to person, place, and time.    ED Results / Procedures / Treatments   Labs (all labs ordered are listed, but only abnormal results are displayed) Labs Reviewed  URINALYSIS, ROUTINE W REFLEX MICROSCOPIC - Abnormal; Notable for the following components:      Result Value   Hgb urine dipstick MODERATE (*)     Ketones, ur 5 (*)    All other components within normal limits  URINE CULTURE    EKG None  Radiology No results found.  Procedures Procedures   Medications Ordered in ED Medications - No data to display  ED Course  I have reviewed the triage vital signs and the nursing notes.  Pertinent labs & imaging results that were available during my care of the patient were reviewed by me and considered in my medical decision making (see chart for details).    MDM Rules/Calculators/A&P                           70 year old male presenting to the ED with urinary retention.  He had prostate biopsy this morning at [facility] for screening of cancer.  He urinated a very small amount postprocedure but has been unable to void since then.  He reports a lot of pressure in his abdomen.  He is afebrile, nontoxic.  Bladder scan with 597 cc.  Foley was inserted and drained out >600cc clear/yellow urine.  He reports feeling much better at this time.  UA with no bacteria seen, culture sent.  Feel he is stable for discharge home.  Will leave foley in place for now, he will contact his urology team about this.  He is on prophylactic ciprofloxacin, will finish this as per his team.  Return here for any new/acute changes.  Final Clinical Impression(s) / ED Diagnoses Final diagnoses:  Urinary retention    Rx / DC Orders ED Discharge Orders     None        Larene Pickett, PA-C 10/19/20 0030    Gareth Morgan, MD 10/19/20 [phone]

## 2020-10-19 ENCOUNTER — Other Ambulatory Visit: Payer: Self-pay

## 2020-10-19 ENCOUNTER — Encounter (HOSPITAL_COMMUNITY): Payer: Self-pay | Admitting: Emergency Medicine

## 2020-10-19 ENCOUNTER — Emergency Department (HOSPITAL_COMMUNITY)
Admission: EM | Admit: 2020-10-19 | Discharge: 2020-10-19 | Disposition: A | Discharge status: Home / Self Care | Payer: Medicare Other | Source: Home / Self Care | Attending: Emergency Medicine | Admitting: Emergency Medicine

## 2020-10-19 DIAGNOSIS — Z87891 Personal history of nicotine dependence: Secondary | ICD-10-CM | POA: Insufficient documentation

## 2020-10-19 DIAGNOSIS — T83098A Other mechanical complication of other indwelling urethral catheter, initial encounter: Secondary | ICD-10-CM | POA: Insufficient documentation

## 2020-10-19 DIAGNOSIS — Z85038 Personal history of other malignant neoplasm of large intestine: Secondary | ICD-10-CM | POA: Insufficient documentation

## 2020-10-19 DIAGNOSIS — Z466 Encounter for fitting and adjustment of urinary device: Secondary | ICD-10-CM | POA: Insufficient documentation

## 2020-10-19 DIAGNOSIS — Z7982 Long term (current) use of aspirin: Secondary | ICD-10-CM | POA: Insufficient documentation

## 2020-10-19 DIAGNOSIS — I2510 Atherosclerotic heart disease of native coronary artery without angina pectoris: Secondary | ICD-10-CM | POA: Insufficient documentation

## 2020-10-19 DIAGNOSIS — E119 Type 2 diabetes mellitus without complications: Secondary | ICD-10-CM | POA: Insufficient documentation

## 2020-10-19 DIAGNOSIS — T839XXA Unspecified complication of genitourinary prosthetic device, implant and graft, initial encounter: Secondary | ICD-10-CM

## 2020-10-19 NOTE — Discharge Instructions (Addendum)
Finish your Cipro as per your urology team. Let urologist know we had to put in foley catheter so they can schedule you for follow-up for removal. Return here for any new or acute changes--Foley not draining, fever, recurrent pain

## 2020-10-19 NOTE — ED Triage Notes (Signed)
Patient states some of the urine is going into his cathter bag but a lot is leaking around his catheter, soaking his pants. Burning when he urinates, 'few specks' of blood in the urine.

## 2020-10-19 NOTE — Discharge Instructions (Addendum)
Follow-up with your urologist to determine when you can remove the Foley catheter.  Return as needed for worsening symptoms

## 2020-10-19 NOTE — ED Provider Notes (Signed)
Emergency Medicine Provider Triage Evaluation Note  John Spencer , a 70 y.o. male  was evaluated in triage.  Pt complains of foley catheter issue. Placed last night, urine is going into the bag but also coming out around the catheter soaking his pants. No significant pain.   Review of Systems  Positive: Catheter problem Negative: Abdominal pain, fever  Physical Exam  BP (!) 142/70 (BP Location: Left Arm)   Pulse 85   Temp 98.9 F (37.2 C) (Oral)   Resp 16   Ht 5' 11.5" (1.816 m)   Wt 78.5 kg   SpO2 95%   BMI 23.79 kg/m  Gen:   Awake, no distress   Resp:  Normal effort  MSK:   Moves extremities without difficulty    Medical Decision Making  Medically screening exam initiated at 2:37 PM.  Appropriate orders placed.  Clifton Getto was informed that the remainder of the evaluation will be completed by another provider, this initial triage assessment does not replace that evaluation, and the importance of remaining in the ED until their evaluation is complete.  Cathter problem.    Amaryllis Dyke, PA-C 10/19/20 1437    Regan Lemming, MD 10/19/20 [phone]

## 2020-10-19 NOTE — ED Provider Notes (Signed)
[facility] DEPT Provider Note   CSN: [id number] Arrival date & time: 10/19/20  1404     History Chief complaint: Leaking around his Foley catheter  John Spencer is a 70 y.o. male.  HPI  Patient states he had a prostate biopsy yesterday.  This was performed by his urologist at [facility].  Patient states later on in the evening he started having difficulty urinating. He was unable to urinate and started developing extreme discomfort.  Patient had a Foley catheter placed at this facility last evening.  Patient states that he thinks the staff had difficulty placing the catheter but he did feel much better after the catheter was placed and it was draining urine.  Patient states throughout the night he has noticed that the catheter is leaking.  Urine seems to be coming from around the catheter although some of it is going in the catheter bag.  He has noted some small spots of blood in the catheter as well.  Patient came to the ED because of the leakage.  He denies any fevers.  No abdominal pain. Past Medical History:  Diagnosis Date   At average risk for colon cancer    BPH (benign prostatic hyperplasia)    Coronary artery disease    Prior PCI x4, per patient.   Diabetes mellitus without complication ([place])    Heart attack ([place])    Internal carotid artery stenosis, left     There are no problems to display for this patient.   Past Surgical History:  Procedure Laterality Date   APPENDECTOMY         Family History  Problem Relation Age of Onset   Heart disease Mother    Hypertension Mother    Heart disease Father    Hypertension Father    Colon cancer Sister    Hypertension Brother     Social History   Tobacco Use   Smoking status: Former    Packs/day: 1.00    Years: 3.00    Pack years: 3.00    Types: Cigarettes    Quit date: 1992    Years since quitting: 30.5   Smokeless tobacco: Never  Vaping Use   Vaping Use: Never  used  Substance Use Topics   Alcohol use: Never   Drug use: Not Currently    Types: Marijuana    Home Medications Prior to Admission medications   Medication Sig Start Date End Date Taking? Authorizing Provider  aspirin EC 81 MG tablet Take 1 tablet (81 mg total) by mouth daily. Swallow whole. 08/08/20   Tolia, Sunit, DO  rosuvastatin (CRESTOR) 20 MG tablet Take 1 tablet (20 mg total) by mouth at bedtime. 08/08/20 11/06/20  Tolia, Sunit, DO  tamsulosin (FLOMAX) 0.4 MG CAPS capsule Take 0.8 mg by mouth daily. 08/08/19   [provider]    Allergies    Patient has no known allergies.  Review of Systems   Review of Systems  All other systems reviewed and are negative.  Physical Exam Updated Vital Signs BP 132/66   Pulse 63   Temp 98.6 F (37 C)   Resp 18   Ht 1.816 m (5' 11.5")   Wt 78.5 kg   SpO2 94%   BMI 23.79 kg/m   Physical Exam Vitals and nursing note reviewed.  Constitutional:      General: He is not in acute distress.    Appearance: He is well-developed.  HENT:     Head: Normocephalic  and atraumatic.     Right Ear: External ear normal.     Left Ear: External ear normal.  Eyes:     General: No scleral icterus.       Right eye: No discharge.        Left eye: No discharge.     Conjunctiva/sclera: Conjunctivae normal.  Neck:     Trachea: No tracheal deviation.  Cardiovascular:     Rate and Rhythm: Normal rate.  Pulmonary:     Effort: Pulmonary effort is normal. No respiratory distress.     Breath sounds: No stridor.  Abdominal:     General: There is no distension.  Musculoskeletal:        General: No swelling or deformity.     Cervical back: Neck supple.  Skin:    General: Skin is warm and dry.     Findings: No rash.  Neurological:     Mental Status: He is alert.     Cranial Nerves: Cranial nerve deficit: no gross deficits.    ED Results / Procedures / Treatments   Labs (all labs ordered are listed, but only abnormal results are  displayed) Labs Reviewed - No data to display  EKG None  Radiology No results found.  Procedures Procedures   Medications Ordered in ED Medications - No data to display  ED Course  I have reviewed the triage vital signs and the nursing notes.  Pertinent labs & imaging results that were available during my care of the patient were reviewed by me and considered in my medical decision making (see chart for details).  Clinical Course as of 10/20/20 1504  Sat Oct 19, 2020  1713 Catheter does not seem to be irrigating properly.  Urine coming around the catheter.  Will replace [JK]  1855 Foley catheter has been placed.  Appears to be functioning properly.  Patient is feeling better [JK]    Clinical Course User Index [JK] Dorie Rank, MD   MDM Rules/Calculators/A&P                           Foley catheter not functioning properly.  Unable to flush.  Likely clogged.   Catheter replaced by RN and pt notices significant improvement.  No further leakage.   Dc home, follow up urologist Final Clinical Impression(s) / ED Diagnoses Final diagnoses:  Complication of Foley catheter, initial encounter [facility])    Rx / DC Orders ED Discharge Orders     None        Dorie Rank, MD 10/20/20 1505

## 2020-10-20 LAB — URINE CULTURE: Culture: <10000 — AB

## 2020-11-05 ENCOUNTER — Other Ambulatory Visit: Payer: Self-pay | Admitting: Cardiology

## 2020-11-05 DIAGNOSIS — I2510 Atherosclerotic heart disease of native coronary artery without angina pectoris: Secondary | ICD-10-CM

## 2020-11-05 DIAGNOSIS — Z955 Presence of coronary angioplasty implant and graft: Secondary | ICD-10-CM

## 2020-11-05 DIAGNOSIS — I252 Old myocardial infarction: Secondary | ICD-10-CM

## 2021-02-02 ENCOUNTER — Other Ambulatory Visit: Payer: Self-pay | Admitting: Cardiology

## 2021-02-02 DIAGNOSIS — Z955 Presence of coronary angioplasty implant and graft: Secondary | ICD-10-CM

## 2021-02-02 DIAGNOSIS — I252 Old myocardial infarction: Secondary | ICD-10-CM

## 2021-02-02 DIAGNOSIS — I2510 Atherosclerotic heart disease of native coronary artery without angina pectoris: Secondary | ICD-10-CM

## 2021-03-12 ENCOUNTER — Emergency Department (HOSPITAL_COMMUNITY)
Admission: EM | Admit: 2021-03-12 | Discharge: 2021-03-12 | Disposition: A | Discharge status: Home / Self Care | Payer: Medicare Other | Attending: Emergency Medicine | Admitting: Emergency Medicine

## 2021-03-12 ENCOUNTER — Other Ambulatory Visit: Payer: Self-pay

## 2021-03-12 ENCOUNTER — Encounter (HOSPITAL_COMMUNITY): Payer: Self-pay | Admitting: Oncology

## 2021-03-12 DIAGNOSIS — Z7982 Long term (current) use of aspirin: Secondary | ICD-10-CM | POA: Insufficient documentation

## 2021-03-12 DIAGNOSIS — I2510 Atherosclerotic heart disease of native coronary artery without angina pectoris: Secondary | ICD-10-CM | POA: Diagnosis not present

## 2021-03-12 DIAGNOSIS — Z87891 Personal history of nicotine dependence: Secondary | ICD-10-CM | POA: Diagnosis not present

## 2021-03-12 DIAGNOSIS — R339 Retention of urine, unspecified: Secondary | ICD-10-CM | POA: Diagnosis present

## 2021-03-12 DIAGNOSIS — E119 Type 2 diabetes mellitus without complications: Secondary | ICD-10-CM | POA: Diagnosis not present

## 2021-03-12 LAB — CBC WITH DIFFERENTIAL/PLATELET
Abs Immature Granulocytes: 0.02 10*3/uL (ref 0.00–0.07)
Basophils Absolute: 0.1 10*3/uL (ref 0.0–0.1)
Basophils Relative: 1 %
Eosinophils Absolute: 0.1 10*3/uL (ref 0.0–0.5)
Eosinophils Relative: 1 %
HCT: 49.5 % (ref 39.0–52.0)
Hemoglobin: 16.4 g/dL (ref 13.0–17.0)
Immature Granulocytes: 0 %
Lymphocytes Relative: 19 %
Lymphs Abs: 1.6 10*3/uL (ref 0.7–4.0)
MCH: 31.5 pg (ref 26.0–34.0)
MCHC: 33.1 g/dL (ref 30.0–36.0)
MCV: 95 fL (ref 80.0–100.0)
Monocytes Absolute: 0.6 10*3/uL (ref 0.1–1.0)
Monocytes Relative: 7 %
Neutro Abs: 6.3 10*3/uL (ref 1.7–7.7)
Neutrophils Relative %: 72 %
Platelets: 280 10*3/uL (ref 150–400)
RBC: 5.21 MIL/uL (ref 4.22–5.81)
RDW: 12.7 % (ref 11.5–15.5)
WBC: 8.7 10*3/uL (ref 4.0–10.5)
nRBC: 0 % (ref 0.0–0.2)

## 2021-03-12 LAB — BASIC METABOLIC PANEL
Anion gap: 8 (ref 5–15)
BUN: 12 mg/dL (ref 8–23)
CO2: 23 mmol/L (ref 22–32)
Calcium: 9.1 mg/dL (ref 8.9–10.3)
Chloride: 105 mmol/L (ref 98–111)
Creatinine, Ser: 1.11 mg/dL (ref 0.61–1.24)
GFR, Estimated: >60 mL/min (ref >60)
Glucose, Bld: 104 mg/dL — ABNORMAL HIGH (ref 70–99)
Potassium: 4.2 mmol/L (ref 3.5–5.1)
Sodium: 136 mmol/L (ref 135–145)

## 2021-03-12 LAB — URINALYSIS, ROUTINE W REFLEX MICROSCOPIC
Bilirubin Urine: NEGATIVE
Glucose, UA: NEGATIVE mg/dL
Ketones, ur: NEGATIVE mg/dL
Leukocytes,Ua: NEGATIVE
Nitrite: NEGATIVE
Protein, ur: NEGATIVE mg/dL
Specific Gravity, Urine: 1.01 (ref 1.005–1.030)
pH: 6 (ref 5.0–8.0)

## 2021-03-12 LAB — URINALYSIS, MICROSCOPIC (REFLEX)
Bacteria, UA: NONE SEEN
Squamous Epithelial / HPF: NONE SEEN (ref 0–5)

## 2021-03-12 MED ORDER — HYDROCODONE-ACETAMINOPHEN 5-325 MG PO TABS
1.0000 | ORAL_TABLET | Freq: Once | ORAL | Status: AC
Start: 2021-03-12 — End: 2021-03-12
  Administered 2021-03-12: 17:00:00 1 via ORAL
  Filled 2021-03-12: qty 1

## 2021-03-12 NOTE — ED Provider Notes (Signed)
Emergency Medicine Provider Triage Evaluation Note  John Spencer , a 70 y.o. male  was evaluated in triage.  Pt complains of urinary retention since this morning. Recurrent issue. Pain / cramping in bladder. Has been catheterized in the past. Hx of prostate cancer diagnosed 2011, enlarged prostate.  Review of Systems  Positive: As above Negative: As above  Physical Exam  BP (!) 198/98 (BP Location: Left Arm)    Pulse 100    Temp 98.1 F (36.7 C) (Oral)    Resp 18    Ht 5' 11.5" (1.816 m)    Wt 75.8 kg    SpO2 95%    BMI 22.97 kg/m  Gen:   Awake, no distress   Resp:  Normal effort  MSK:   Moves extremities without difficulty  Other:  Pain suprapubically  Medical Decision Making  Medically screening exam initiated at 5:04 PM.  Appropriate orders placed.  John Spencer was informed that the remainder of the evaluation will be completed by another provider, this initial triage assessment does not replace that evaluation, and the importance of remaining in the ED until their evaluation is complete.  Urinary retention   John Spencer 03/12/21 1706    John Saver, MD 03/12/21 [phone]

## 2021-03-12 NOTE — ED Provider Notes (Signed)
[facility] DEPT Provider Note   CSN: [id number] Arrival date & time: 03/12/21  1612     History Chief Complaint  Patient presents with   Urinary Retention    Parvin Stetzer is a 70 y.o. male.  Pt with hx bph and prostate ca, hx urine retention, c/o urinary retention onset today. States has felt unable to empty bladder, suprapubic fullness. Symptoms acute onset, moderate, constant, dull, persistent, non radiating. No scrotal or testicular pain or swelling. No fever or chills. No dysuria or hematuria. No back/flank pain.  No recent change in meds.   The history is provided by the patient and medical records.      Past Medical History:  Diagnosis Date   At average risk for colon cancer    BPH (benign prostatic hyperplasia)    Coronary artery disease    Prior PCI x4, per patient.   Diabetes mellitus without complication ([place])    Heart attack ([place])    Internal carotid artery stenosis, left     There are no problems to display for this patient.   Past Surgical History:  Procedure Laterality Date   APPENDECTOMY         Family History  Problem Relation Age of Onset   Heart disease Mother    Hypertension Mother    Heart disease Father    Hypertension Father    Colon cancer Sister    Hypertension Brother     Social History   Tobacco Use   Smoking status: Former    Packs/day: 1.00    Years: 3.00    Pack years: 3.00    Types: Cigarettes    Quit date: 1992    Years since quitting: 30.9   Smokeless tobacco: Never  Vaping Use   Vaping Use: Never used  Substance Use Topics   Alcohol use: Never   Drug use: Not Currently    Types: Marijuana    Home Medications Prior to Admission medications   Medication Sig Start Date End Date Taking? Authorizing Provider  aspirin EC 81 MG tablet Take 1 tablet (81 mg total) by mouth daily. Swallow whole. 08/08/20   Tolia, Sunit, DO  rosuvastatin (CRESTOR) 20 MG tablet TAKE 1 TABLET BY MOUTH  EVERYDAY AT BEDTIME 02/03/21   Tolia, Sunit, DO  tamsulosin (FLOMAX) 0.4 MG CAPS capsule Take 0.8 mg by mouth daily. 08/08/19   [provider]    Allergies    Patient has no known allergies.  Review of Systems   Review of Systems  Constitutional:  Negative for chills and fever.  HENT:  Negative for sore throat.   Eyes:  Negative for redness.  Respiratory:  Negative for shortness of breath.   Cardiovascular:  Negative for chest pain.  Gastrointestinal:  Negative for nausea and vomiting.  Genitourinary:  Positive for difficulty urinating. Negative for dysuria, flank pain, hematuria and testicular pain.  Musculoskeletal:  Negative for back pain.  Skin:  Negative for rash.  Neurological:  Negative for headaches.  Hematological:  Does not bruise/bleed easily.  Psychiatric/Behavioral:  Negative for confusion.    Physical Exam Updated Vital Signs BP (!) 198/98 (BP Location: Left Arm)    Pulse 100    Temp 98.1 F (36.7 C) (Oral)    Resp 18    Ht 1.816 m (5' 11.5")    Wt 75.8 kg    SpO2 95%    BMI 22.97 kg/m   Physical Exam Vitals and nursing note reviewed.  Constitutional:  Appearance: Normal appearance. He is well-developed.  HENT:     Head: Atraumatic.     Nose: Nose normal.     Mouth/Throat:     Mouth: Mucous membranes are moist.  Eyes:     General: No scleral icterus.    Conjunctiva/sclera: Conjunctivae normal.  Neck:     Trachea: No tracheal deviation.  Cardiovascular:     Rate and Rhythm: Normal rate.     Pulses: Normal pulses.  Pulmonary:     Effort: Pulmonary effort is normal. No accessory muscle usage or respiratory distress.  Abdominal:     General: Bowel sounds are normal. There is no distension.     Palpations: Abdomen is soft. There is no mass.     Tenderness: There is no abdominal tenderness. There is no guarding or rebound.     Hernia: No hernia is present.  Genitourinary:    Comments: No cva tenderness. Normal external gu exam.   Musculoskeletal:        General: No swelling.     Cervical back: Normal range of motion and neck supple. No rigidity.  Skin:    General: Skin is warm and dry.     Findings: No rash.  Neurological:     Mental Status: He is alert.     Comments: Alert, speech clear. Steady gait.   Psychiatric:        Mood and Affect: Mood normal.    ED Results / Procedures / Treatments   Labs (all labs ordered are listed, but only abnormal results are displayed) Results for orders placed or performed during the hospital encounter of 03/12/21  CBC with Differential  Result Value Ref Range   WBC 8.7 4.0 - 10.5 K/uL   RBC 5.21 4.22 - 5.81 MIL/uL   Hemoglobin 16.4 13.0 - 17.0 g/dL   HCT 49.5 39.0 - 52.0 %   MCV 95.0 80.0 - 100.0 fL   MCH 31.5 26.0 - 34.0 pg   MCHC 33.1 30.0 - 36.0 g/dL   RDW 12.7 11.5 - 15.5 %   Platelets 280 150 - 400 K/uL   nRBC 0.0 0.0 - 0.2 %   Neutrophils Relative % 72 %   Neutro Abs 6.3 1.7 - 7.7 K/uL   Lymphocytes Relative 19 %   Lymphs Abs 1.6 0.7 - 4.0 K/uL   Monocytes Relative 7 %   Monocytes Absolute 0.6 0.1 - 1.0 K/uL   Eosinophils Relative 1 %   Eosinophils Absolute 0.1 0.0 - 0.5 K/uL   Basophils Relative 1 %   Basophils Absolute 0.1 0.0 - 0.1 K/uL   Immature Granulocytes 0 %   Abs Immature Granulocytes 0.02 0.00 - 0.07 K/uL  Basic metabolic panel  Result Value Ref Range   Sodium 136 135 - 145 mmol/L   Potassium 4.2 3.5 - 5.1 mmol/L   Chloride 105 98 - 111 mmol/L   CO2 23 22 - 32 mmol/L   Glucose, Bld 104 (H) 70 - 99 mg/dL   BUN 12 8 - 23 mg/dL   Creatinine, Ser 1.11 0.61 - 1.24 mg/dL   Calcium 9.1 8.9 - 10.3 mg/dL   GFR, Estimated >60 >60 mL/min   Anion gap 8 5 - 15      EKG None  Radiology No results found.  Procedures Procedures   Medications Ordered in ED Medications  HYDROcodone-acetaminophen (NORCO/VICODIN) 5-325 MG per tablet 1 tablet (1 tablet Oral Given 03/12/21 1728)    ED Course  I have reviewed the triage vital  signs and the  nursing notes.  Pertinent labs & imaging results that were available during my care of the patient were reviewed by me and considered in my medical decision making (see chart for details).    MDM Rules/Calculators/A&P                          Labs sent.   Reviewed nursing notes and prior charts for additional history.   Bladder scan - post void residual high. Foley catheter placed/leg bag.   Labs reviewed/interpreted by me - wbc and hgb normal. Ua pending.   Recheck, pt notes resolution of symptoms w foley, 800 cc urine in bag.   No abd pain or discomfort. Pt requests d/c.  Rec urology f/u.    Final Clinical Impression(s) / ED Diagnoses Final diagnoses:  None    Rx / DC Orders ED Discharge Orders     None        Lajean Saver, MD 03/12/21 2208

## 2021-03-12 NOTE — Discharge Instructions (Addendum)
It was our pleasure to provide your ER care today - we hope that you feel better.  Empty leg bag as need.   Follow up with urologist in the next 2-3 days for recheck and possible removal of catheter then - call office in AM tomorrow to arrange appointment.   Return to ER if worse, new symptoms, fevers, new/severe pain, persistent vomiting, or other concern.

## 2021-03-12 NOTE — ED Triage Notes (Signed)
Pt reports urinary retention that began about 10 am today.  Pt endorses lower abdominal pain as well as feeling lightheaded.

## 2021-03-29 ENCOUNTER — Encounter (HOSPITAL_BASED_OUTPATIENT_CLINIC_OR_DEPARTMENT_OTHER): Payer: Self-pay | Admitting: Emergency Medicine

## 2021-03-29 ENCOUNTER — Emergency Department (HOSPITAL_BASED_OUTPATIENT_CLINIC_OR_DEPARTMENT_OTHER)
Admission: EM | Admit: 2021-03-29 | Discharge: 2021-03-29 | Disposition: A | Discharge status: Home / Self Care | Payer: Medicare Other | Attending: Emergency Medicine | Admitting: Emergency Medicine

## 2021-03-29 ENCOUNTER — Other Ambulatory Visit: Payer: Self-pay

## 2021-03-29 DIAGNOSIS — Z7982 Long term (current) use of aspirin: Secondary | ICD-10-CM | POA: Insufficient documentation

## 2021-03-29 DIAGNOSIS — I2510 Atherosclerotic heart disease of native coronary artery without angina pectoris: Secondary | ICD-10-CM | POA: Diagnosis not present

## 2021-03-29 DIAGNOSIS — E119 Type 2 diabetes mellitus without complications: Secondary | ICD-10-CM | POA: Diagnosis not present

## 2021-03-29 DIAGNOSIS — R030 Elevated blood-pressure reading, without diagnosis of hypertension: Secondary | ICD-10-CM | POA: Insufficient documentation

## 2021-03-29 DIAGNOSIS — Z87891 Personal history of nicotine dependence: Secondary | ICD-10-CM | POA: Insufficient documentation

## 2021-03-29 DIAGNOSIS — R339 Retention of urine, unspecified: Secondary | ICD-10-CM | POA: Diagnosis not present

## 2021-03-29 LAB — URINALYSIS, ROUTINE W REFLEX MICROSCOPIC
Bilirubin Urine: NEGATIVE
Glucose, UA: NEGATIVE mg/dL
Ketones, ur: NEGATIVE mg/dL
Nitrite: NEGATIVE
Specific Gravity, Urine: 1.009 (ref 1.005–1.030)
pH: 5.5 (ref 5.0–8.0)

## 2021-03-29 LAB — COMPREHENSIVE METABOLIC PANEL
ALT: 17 U/L (ref 0–44)
AST: 29 U/L (ref 15–41)
Albumin: 3.8 g/dL (ref 3.5–5.0)
Alkaline Phosphatase: 89 U/L (ref 38–126)
Anion gap: 13 (ref 5–15)
BUN: 19 mg/dL (ref 8–23)
CO2: 26 mmol/L (ref 22–32)
Calcium: 9.3 mg/dL (ref 8.9–10.3)
Chloride: 92 mmol/L — ABNORMAL LOW (ref 98–111)
Creatinine, Ser: 1.14 mg/dL (ref 0.61–1.24)
GFR, Estimated: >60 mL/min (ref >60)
Glucose, Bld: 114 mg/dL — ABNORMAL HIGH (ref 70–99)
Potassium: 3.8 mmol/L (ref 3.5–5.1)
Sodium: 131 mmol/L — ABNORMAL LOW (ref 135–145)
Total Bilirubin: 0.6 mg/dL (ref 0.3–1.2)
Total Protein: 8.4 g/dL — ABNORMAL HIGH (ref 6.5–8.1)

## 2021-03-29 LAB — CBC
HCT: 49.1 % (ref 39.0–52.0)
Hemoglobin: 16.3 g/dL (ref 13.0–17.0)
MCH: 30.7 pg (ref 26.0–34.0)
MCHC: 33.2 g/dL (ref 30.0–36.0)
MCV: 92.5 fL (ref 80.0–100.0)
Platelets: 262 10*3/uL (ref 150–400)
RBC: 5.31 MIL/uL (ref 4.22–5.81)
RDW: 12.3 % (ref 11.5–15.5)
WBC: 6.3 10*3/uL (ref 4.0–10.5)
nRBC: 0 % (ref 0.0–0.2)

## 2021-03-29 LAB — LIPASE, BLOOD: Lipase: 24 U/L (ref 11–51)

## 2021-03-29 MED ORDER — LIDOCAINE HCL URETHRAL/MUCOSAL 2 % EX GEL
1.0000 "application " | Freq: Once | CUTANEOUS | Status: DC
  Filled 2021-03-29: qty 11

## 2021-03-29 MED ORDER — OXYCODONE HCL 5 MG PO TABS
5.0000 mg | ORAL_TABLET | Freq: Once | ORAL | Status: AC
  Administered 2021-03-29: 5 mg via ORAL
  Filled 2021-03-29: qty 1

## 2021-03-29 MED ORDER — ACETAMINOPHEN 500 MG PO TABS
1000.0000 mg | ORAL_TABLET | Freq: Once | ORAL | Status: AC
  Administered 2021-03-29: 1000 mg via ORAL
  Filled 2021-03-29: qty 2

## 2021-03-29 NOTE — Discharge Instructions (Signed)
Your urine had some bacteria in it.  Not sure of the significance of this.  Please return to the ED or call your urologist if you develop a fever or pain in your side.  Please follow-up with urologist in the office.

## 2021-03-29 NOTE — ED Notes (Signed)
Bladder scan volume 288 mL.

## 2021-03-29 NOTE — ED Provider Notes (Signed)
[name] EMERGENCY DEPT Provider Note   CSN: [id number] Arrival date & time: 03/29/21  1201     History Chief Complaint  Patient presents with   Urinary Retention    John Spencer is a 70 y.o. male.  70 yo M with a chief complaints of difficulty urinating.  Is been going on for the past 24 hours.  Saw his urologist yesterday and had his Foley catheter removed after having an for couple weeks.  Since then has only been able to get dribbles out of his felt very uncomfortable.  He denies fevers or chills.  Denies flank pain.  The history is provided by the patient.  Illness Severity:  Moderate Onset quality:  Gradual Duration:  12 hours Timing:  Constant Progression:  Worsening Chronicity:  New Associated symptoms: no abdominal pain, no chest pain, no congestion, no diarrhea, no fever, no headaches, no myalgias, no rash, no shortness of breath and no vomiting       Past Medical History:  Diagnosis Date   At average risk for colon cancer    BPH (benign prostatic hyperplasia)    Coronary artery disease    Prior PCI x4, per patient.   Diabetes mellitus without complication ([place])    Heart attack ([place])    Internal carotid artery stenosis, left     There are no problems to display for this patient.   Past Surgical History:  Procedure Laterality Date   APPENDECTOMY         Family History  Problem Relation Age of Onset   Heart disease Mother    Hypertension Mother    Heart disease Father    Hypertension Father    Colon cancer Sister    Hypertension Brother     Social History   Tobacco Use   Smoking status: Former    Packs/day: 1.00    Years: 3.00    Pack years: 3.00    Types: Cigarettes    Quit date: 1992    Years since quitting: 31.0   Smokeless tobacco: Never  Vaping Use   Vaping Use: Never used  Substance Use Topics   Alcohol use: Never   Drug use: Not Currently    Types: Marijuana    Home Medications Prior to Admission  medications   Medication Sig Start Date End Date Taking? Authorizing Provider  aspirin EC 81 MG tablet Take 1 tablet (81 mg total) by mouth daily. Swallow whole. 08/08/20   Tolia, Sunit, DO  rosuvastatin (CRESTOR) 20 MG tablet TAKE 1 TABLET BY MOUTH EVERYDAY AT BEDTIME 02/03/21   Tolia, Sunit, DO  tamsulosin (FLOMAX) 0.4 MG CAPS capsule Take 0.8 mg by mouth daily. 08/08/19   [provider]    Allergies    Patient has no known allergies.  Review of Systems   Review of Systems  Constitutional:  Negative for chills and fever.  HENT:  Negative for congestion and facial swelling.   Eyes:  Negative for discharge and visual disturbance.  Respiratory:  Negative for shortness of breath.   Cardiovascular:  Negative for chest pain and palpitations.  Gastrointestinal:  Negative for abdominal pain, diarrhea and vomiting.  Genitourinary:  Positive for decreased urine volume and difficulty urinating.  Musculoskeletal:  Negative for arthralgias and myalgias.  Skin:  Negative for color change and rash.  Neurological:  Negative for tremors, syncope and headaches.  Psychiatric/Behavioral:  Negative for confusion and dysphoric mood.    Physical Exam Updated Vital Signs BP (!) 152/115  Pulse (!) 112    Temp 99.2 F (37.3 C) (Oral)    Resp 18    Ht 5' 11.5" (1.816 m)    Wt 73.9 kg    SpO2 96%    BMI 22.42 kg/m   Physical Exam Vitals and nursing note reviewed.  Constitutional:      Appearance: He is well-developed.  HENT:     Head: Normocephalic and atraumatic.  Eyes:     Pupils: Pupils are equal, round, and reactive to light.  Neck:     Vascular: No JVD.  Cardiovascular:     Rate and Rhythm: Normal rate and regular rhythm.     Heart sounds: No murmur heard.   No friction rub. No gallop.  Pulmonary:     Effort: No respiratory distress.     Breath sounds: No wheezing.  Abdominal:     General: There is no distension.     Tenderness: There is no abdominal tenderness. There is no  guarding or rebound.  Musculoskeletal:        General: Normal range of motion.     Cervical back: Normal range of motion and neck supple.  Skin:    Coloration: Skin is not pale.     Findings: No rash.  Neurological:     Mental Status: He is alert and oriented to person, place, and time.  Psychiatric:        Behavior: Behavior normal.    ED Results / Procedures / Treatments   Labs (all labs ordered are listed, but only abnormal results are displayed) Labs Reviewed  COMPREHENSIVE METABOLIC PANEL - Abnormal; Notable for the following components:      Result Value   Sodium 131 (*)    Chloride 92 (*)    Glucose, Bld 114 (*)    Total Protein 8.4 (*)    All other components within normal limits  URINALYSIS, ROUTINE W REFLEX MICROSCOPIC - Abnormal; Notable for the following components:   APPearance HAZY (*)    Hgb urine dipstick LARGE (*)    Protein, ur TRACE (*)    Leukocytes,Ua MODERATE (*)    Bacteria, UA MANY (*)    All other components within normal limits  URINE CULTURE  LIPASE, BLOOD  CBC    EKG EKG Interpretation  Date/Time:  Saturday March 29 2021 12:44:03 EST Ventricular Rate:  120 PR Interval:  176 QRS Duration: 88 QT Interval:  304 QTC Calculation: 429 R Axis:   41 Text Interpretation: Sinus tachycardia Right atrial enlargement Anteroseptal infarct (cited on or before 05-Aug-2020) Abnormal ECG When compared with ECG of 05-Aug-2020 16:14, Premature ventricular complexes are no longer Present Vent. rate has increased BY  54 BPM Nonspecific T wave abnormality, improved in Lateral leads Otherwise no significant change Confirmed by Deno Etienne [phone]) on 03/29/2021 4:01:28 PM  Radiology No results found.  Procedures Procedures   Medications Ordered in ED Medications  lidocaine (XYLOCAINE) 2 % jelly 1 application (1 application Urethral Not Given 03/29/21 1644)  oxyCODONE (Oxy IR/ROXICODONE) immediate release tablet 5 mg (5 mg Oral Given 03/29/21 1640)   acetaminophen (TYLENOL) tablet 1,000 mg (1,000 mg Oral Given 03/29/21 1640)    ED Course  I have reviewed the triage vital signs and the nursing notes.  Pertinent labs & imaging results that were available during my care of the patient were reviewed by me and considered in my medical decision making (see chart for details).    MDM Rules/Calculators/A&P  70 yo M with a chief complaints of acute urinary retention.  This is a recurrent problem for him.  Just had a catheter removed yesterday.  He is very uncomfortable on my initial exam.  Will replace Foley here.  Reassess.  Patient feeling much better after Foley catheter placement.  His UA did show too numerous to count bacteria.  Not sure of the significance of this.  We will send off a culture.  We will have him call his urologist on the next working day.  5:43 PM:  I have discussed the diagnosis/risks/treatment options with the patient and family and believe the pt to be eligible for discharge home to follow-up with Urology. We also discussed returning to the ED immediately if new or worsening sx occur. We discussed the sx which are most concerning (e.g., sudden worsening pain, fever, inability to tolerate by mouth, flank pain) that necessitate immediate return. Medications administered to the patient during their visit and any new prescriptions provided to the patient are listed below.  Medications given during this visit Medications  lidocaine (XYLOCAINE) 2 % jelly 1 application (1 application Urethral Not Given 03/29/21 1644)  oxyCODONE (Oxy IR/ROXICODONE) immediate release tablet 5 mg (5 mg Oral Given 03/29/21 1640)  acetaminophen (TYLENOL) tablet 1,000 mg (1,000 mg Oral Given 03/29/21 1640)     The patient appears reasonably screen and/or stabilized for discharge and I doubt any other medical condition or other [facility] requiring further screening, evaluation, or treatment in the ED at this time prior to discharge.       Final Clinical Impression(s) / ED Diagnoses Final diagnoses:  Urinary retention    Rx / DC Orders ED Discharge Orders     None        Deno Etienne, DO 03/29/21 1743

## 2021-03-29 NOTE — ED Triage Notes (Signed)
Pt c/o urinary retention, states last urinated about 3 hrs PTA. Pt states he had a foley catheter removed yesterday that was in place for two weeks. States after removal pt had no issue urinating but has had increased difficulty urinating today. Pt states he is going to see a surgeon Thursday to see about getting his prostate removed. Hx enlarged prostate.

## 2021-04-01 LAB — URINE CULTURE: Culture: >=100000 — AB

## 2021-04-02 ENCOUNTER — Telehealth (HOSPITAL_BASED_OUTPATIENT_CLINIC_OR_DEPARTMENT_OTHER): Payer: Self-pay | Admitting: *Deleted

## 2021-04-02 NOTE — Telephone Encounter (Signed)
Post ED Visit - Positive Culture Follow-up  Culture report reviewed by antimicrobial stewardship pharmacist: [facility] Team []  Elenor Quinones, Pharm.D. [x]  Heide Guile, Pharm.D., BCPS AQ-ID []  Parks Neptune, Pharm.D., BCPS []  Alycia Rossetti, Pharm.D., BCPS []  Mason, Pharm.D., BCPS, AAHIVP []  Legrand Como, Pharm.D., BCPS, AAHIVP []  Salome Arnt, PharmD, BCPS []  Johnnette Gourd, PharmD, BCPS []  Hughes Better, PharmD, BCPS []  Leeroy Cha, PharmD []  Laqueta Linden, PharmD, BCPS []  Albertina Parr, PharmD  [facility] Team []  Leodis Sias, PharmD []  Lindell Spar, PharmD []  Royetta Asal, PharmD []  Graylin Shiver, Rph []  Rema Fendt) Glennon Mac, PharmD []  Arlyn Dunning, PharmD []  Netta Cedars, PharmD []  Dia Sitter, PharmD []  Leone Haven, PharmD []  Gretta Arab, PharmD []  Theodis Shove, PharmD []  Peggyann Juba, PharmD []  Reuel Boom, PharmD   Positive urine culture no further patient follow-up is required at this time.  Rosie Fate 04/02/2021, 11:59 AM

## 2021-04-14 ENCOUNTER — Ambulatory Visit: Payer: Medicare Other | Admitting: Cardiology

## 2021-04-15 ENCOUNTER — Other Ambulatory Visit: Payer: Self-pay | Admitting: Urology

## 2021-04-18 NOTE — Progress Notes (Signed)
Sent message, via epic in basket, requesting orders in epic from surgeon.  

## 2021-04-22 NOTE — Patient Instructions (Signed)
DUE TO COVID-19 ONLY ONE VISITOR IS ALLOWED TO COME WITH YOU AND STAY IN THE WAITING ROOM ONLY DURING PRE OP AND PROCEDURE.   **NO VISITORS ARE ALLOWED IN THE SHORT STAY AREA OR RECOVERY ROOM!!**  IF YOU WILL BE ADMITTED INTO THE HOSPITAL YOU ARE ALLOWED ONLY TWO SUPPORT PEOPLE DURING VISITATION HOURS ONLY (7 AM -8PM)   The support person(s) must pass our screening, gel in and out, and wear a mask at all times, including in the patients room. Patients must also wear a mask when staff or their support person are in the room. Visitors GUEST BADGE MUST BE WORN VISIBLY  One adult visitor may remain with you overnight and MUST be in the room by 8 P.M.  No visitors under the age of 52. Any visitor under the age of 26 must be accompanied by an adult.    COVID SWAB TESTING MUST BE COMPLETED ON:  05/07/21 @ 9:15 AM   Site: [facility] Enter: Main Entrance have a seat in the waiting area to the right of main entrance (DO NOT [place]!!!!!) Dial: [phone] to alert staff you have arrived  You are not required to quarantine, however you are required to wear a well-fitted mask when you are out and around people not in your household.  Hand Hygiene often Do NOT share personal items Notify your provider if you are in close contact with someone who has COVID or you develop fever 100.4 or greater, new onset of sneezing, cough, sore throat, shortness of breath or body aches.  [address], [address], must go inside of the hospital, NOT A DRIVE THRU!  (Must self quarantine after testing. Follow instructions on handout.)       Your procedure is scheduled on: 05/09/21   Report to [facility] Main Entrance    Report to admitting at: 10:15 AM   Call this number if you have problems the morning of surgery [phone]  CLEAR LIQUID DIET: [place]:  9:30 AM.  Foods Allowed                                                                     Foods Excluded  Water, Black Coffee and tea, regular and decaf                             liquids that you cannot  Plain Jell-O in any flavor  (No red)                                           see through such as: Fruit ices (not with fruit pulp)                                     milk, soups, orange juice              Iced Popsicles (No red)  All solid food                                   Apple juices Sports drinks like Gatorade (No red) Lightly seasoned clear broth or consume(fat free) Sugar Sample Menu Breakfast                                Lunch                                     Supper Cranberry juice                    Beef broth                            Chicken broth Jell-O                                     Grape juice                           Apple juice Coffee or tea                        Jell-O                                      Popsicle                                                Coffee or tea                        Coffee or tea    Oral Hygiene is also important to reduce your risk of infection.                                    Remember - BRUSH YOUR TEETH THE MORNING OF SURGERY WITH YOUR REGULAR TOOTHPASTE   Do NOT smoke after Midnight   Take these medicines the morning of surgery with A SIP OF WATER: N/A  DO NOT TAKE ANY ORAL DIABETIC MEDICATIONS DAY OF YOUR SURGERY                              You may not have any metal on your body including hair pins, jewelry, and body piercing             Do not wear lotions, powders, perfumes/cologne, or deodorant               Men may shave face and neck.   Do not bring valuables to the hospital. [place].   Contacts, dentures  or bridgework may not be worn into surgery.   Bring small overnight bag day of surgery.    Patients  discharged on the day of surgery will not be allowed to drive home.  Someone needs to stay with you for the first 24 hours after anesthesia.   Special Instructions: Bring a copy of your healthcare power of attorney and living will documents         the day of surgery if you haven't scanned them before.              Please read over the following fact sheets you were given: IF YOU HAVE QUESTIONS ABOUT YOUR PRE-OP INSTRUCTIONS PLEASE CALL [phone]     [facility] - Preparing for Surgery Before surgery, you can play an important role.  Because skin is not sterile, your skin needs to be as free of germs as possible.  You can reduce the number of germs on your skin by washing with CHG (chlorahexidine gluconate) soap before surgery.  CHG is an antiseptic cleaner which kills germs and bonds with the skin to continue killing germs even after washing. Please DO NOT use if you have an allergy to CHG or antibacterial soaps.  If your skin becomes reddened/irritated stop using the CHG and inform your nurse when you arrive at Short Stay. Do not shave (including legs and underarms) for at least 48 hours prior to the first CHG shower.  You may shave your face/neck. Please follow these instructions carefully:  1.  Shower with CHG Soap the night before surgery and the  morning of Surgery.  2.  If you choose to wash your hair, wash your hair first as usual with your  normal  shampoo.  3.  After you shampoo, rinse your hair and body thoroughly to remove the  shampoo.                           4.  Use CHG as you would any other liquid soap.  You can apply chg directly  to the skin and wash                       Gently with a scrungie or clean washcloth.  5.  Apply the CHG Soap to your body ONLY FROM THE NECK DOWN.   Do not use on face/ open                           Wound or open sores. Avoid contact with eyes, ears mouth and genitals (private parts).                       Wash face,  Genitals (private parts) with  your normal soap.             6.  Wash thoroughly, paying special attention to the area where your surgery  will be performed.  7.  Thoroughly rinse your body with warm water from the neck down.  8.  DO NOT shower/wash with your normal soap after using and rinsing off  the CHG Soap.                9.  Pat yourself dry with a clean towel.            10.  Wear clean pajamas.  11.  Place clean sheets on your bed the night of your first shower and do not  sleep with pets. Day of Surgery : Do not apply any lotions/deodorants the morning of surgery.  Please wear clean clothes to the hospital/surgery center.  FAILURE TO FOLLOW THESE INSTRUCTIONS MAY RESULT IN THE CANCELLATION OF YOUR SURGERY PATIENT SIGNATURE_________________________________  NURSE SIGNATURE__________________________________  ________________________________________________________________________

## 2021-04-24 ENCOUNTER — Encounter (HOSPITAL_COMMUNITY)
Admission: RE | Admit: 2021-04-24 | Discharge: 2021-04-24 | Disposition: A | Discharge status: Home / Self Care | Payer: Medicare Other | Source: Ambulatory Visit | Attending: Urology | Admitting: Urology

## 2021-04-24 ENCOUNTER — Other Ambulatory Visit: Payer: Self-pay

## 2021-04-24 ENCOUNTER — Encounter (HOSPITAL_COMMUNITY): Payer: Self-pay

## 2021-04-24 VITALS — BP 151/73 | HR 57 | Temp 97.7°F | Resp 18 | Ht 71.0 in | Wt 171.5 lb

## 2021-04-24 DIAGNOSIS — I2510 Atherosclerotic heart disease of native coronary artery without angina pectoris: Secondary | ICD-10-CM | POA: Insufficient documentation

## 2021-04-24 DIAGNOSIS — E119 Type 2 diabetes mellitus without complications: Secondary | ICD-10-CM | POA: Insufficient documentation

## 2021-04-24 DIAGNOSIS — Z01812 Encounter for preprocedural laboratory examination: Secondary | ICD-10-CM | POA: Insufficient documentation

## 2021-04-24 DIAGNOSIS — Z01818 Encounter for other preprocedural examination: Secondary | ICD-10-CM

## 2021-04-24 HISTORY — DX: Pneumonia, unspecified organism: J18.9

## 2021-04-24 HISTORY — DX: Malignant (primary) neoplasm, unspecified: C80.1

## 2021-04-24 HISTORY — DX: Depression, unspecified: F32.A

## 2021-04-24 LAB — CBC
HCT: 46.8 % (ref 39.0–52.0)
Hemoglobin: 15.1 g/dL (ref 13.0–17.0)
MCH: 31.3 pg (ref 26.0–34.0)
MCHC: 32.3 g/dL (ref 30.0–36.0)
MCV: 96.9 fL (ref 80.0–100.0)
Platelets: 252 10*3/uL (ref 150–400)
RBC: 4.83 MIL/uL (ref 4.22–5.81)
RDW: 12.7 % (ref 11.5–15.5)
WBC: 7.3 10*3/uL (ref 4.0–10.5)
nRBC: 0 % (ref 0.0–0.2)

## 2021-04-24 LAB — BASIC METABOLIC PANEL
Anion gap: 4 — ABNORMAL LOW (ref 5–15)
BUN: 16 mg/dL (ref 8–23)
CO2: 29 mmol/L (ref 22–32)
Calcium: 9 mg/dL (ref 8.9–10.3)
Chloride: 103 mmol/L (ref 98–111)
Creatinine, Ser: 1.09 mg/dL (ref 0.61–1.24)
GFR, Estimated: >60 mL/min (ref >60)
Glucose, Bld: 91 mg/dL (ref 70–99)
Potassium: 4.4 mmol/L (ref 3.5–5.1)
Sodium: 136 mmol/L (ref 135–145)

## 2021-04-24 LAB — GLUCOSE, CAPILLARY: Glucose-Capillary: 100 mg/dL — ABNORMAL HIGH (ref 70–99)

## 2021-04-24 NOTE — Progress Notes (Signed)
COVID Vaccine Completed: Yes Date COVID Vaccine completed: 03/2021 x 4 COVID vaccine manufacturer:  Moderna    COVID Test: 05/07/21 @ 9:15 AM PCP - [facility]. Cardiologist - DO. Sunit Tolia. LOV: 10/10/20  Chest x-ray -  EKG - 04/03/21 Stress Test5/25/22 -  ECHO - 08/21/20 Cardiac Cath -  Pacemaker/ICD device last checked:  Sleep Study -  CPAP -   Fasting Blood Sugar -  Checks Blood Sugar _____ times a day  Blood Thinner Instructions: Aspirin Instructions: No instructions Last Dose:  Anesthesia review: Hx: CAD,DIA,Heart Attack.  Patient denies shortness of breath, fever, cough and chest pain at PAT appointment   Patient verbalized understanding of instructions that were given to them at the PAT appointment. Patient was also instructed that they will need to review over the PAT instructions again at home before surgery.

## 2021-04-25 LAB — HEMOGLOBIN A1C
Hgb A1c MFr Bld: 5.7 % — ABNORMAL HIGH (ref 4.8–5.6)
Mean Plasma Glucose: 117 mg/dL

## 2021-04-30 NOTE — Progress Notes (Signed)
Anesthesia Chart Review   Case: [id number] Date/Time: 05/09/21 1215   Procedure: XI ROBOTIC ASSISTED SIMPLE PROSTATECTOMY   Anesthesia type: General   Pre-op diagnosis: MASSIVE PROSTATE , RETENTION   Location: [facility] ROOM 03 / [facility]   Surgeons: Alexis Frock, MD       DISCUSSION:71 y.o. former smoker with h/o DM II, CAD, massive prostate with retention scheduled for above procedure 05/09/2021 with Dr. Alexis Frock.   Echo 08/21/2020 with EF 55-60%, mild mitral regurgitation.   Normal stress test 08/28/20.   Anticipate pt can proceed with planned procedure barring acute status change.   VS: BP (!) 151/73    Pulse (!) 57    Temp 36.5 C (Oral)    Resp 18    Ht 5\' 11"  (1.803 m)    Wt 77.8 kg    SpO2 100%    BMI 23.92 kg/m   PROVIDERS: [facility], [place], DO is Cardiologist  LABS: Labs reviewed: Acceptable for surgery. (all labs ordered are listed, but only abnormal results are displayed)  Labs Reviewed  GLUCOSE, CAPILLARY - Abnormal; Notable for the following components:      Result Value   Glucose-Capillary 100 (*)    All other components within normal limits  BASIC METABOLIC PANEL - Abnormal; Notable for the following components:   Anion gap 4 (*)    All other components within normal limits  HEMOGLOBIN A1C - Abnormal; Notable for the following components:   Hgb A1c MFr Bld 5.7 (*)    All other components within normal limits  CBC     IMAGES:   EKG:   CV:  Exercise Myoview stress test 08/28/2020: 1 Day Rest/Stress Protocol. Exercise 9 minutes 1 second on Bruce protocol, achieved 10.17 METS, and 85% of age-predicted maximum heart rate. Stress ECG negative for ischemia.  Normal myocardial perfusion without convincing evidence of reversible myocardial ischemia or prior infarct. Left ventricular wall thickness is preserved without regional wall motion abnormalities. LVEF 51%, visually appears to be preserved No prior studies for  comparison. Low risk study.  Echocardiogram 08/21/2020:  Normal LV systolic function with visual EF 55-60%. Left ventricle cavity  is normal in size. Mild left ventricular hypertrophy. Normal global wall  motion. Indeterminate diastolic filling pattern, normal LAP.  Left atrial cavity is mildly dilated.  Mild (Grade I) mitral regurgitation.  The aortic root is dilated, sinotubular junction 3.8cm.  IVC is normal with a respiratory response of <50%.  No prior study for comparison.  Carotid artery duplex 08/21/2020:  Doppler velocity suggests stenosis in the right internal carotid artery  (minimal).  Doppler velocity suggests stenosis in the left internal carotid artery  (16-49%). Lower limit of spectrum. Stenosis in the left external carotid  artery (<50%).  Antegrade right vertebral artery flow.  Follow up in one year is appropriate if clinically indicated. Past Medical History:  Diagnosis Date   At average risk for colon cancer    BPH (benign prostatic hyperplasia)    Cancer (HCC)    Coronary artery disease    Prior PCI x4, per patient.   Depression    Diabetes mellitus without complication ([place])    Heart attack ([place])    Internal carotid artery stenosis, left    Pneumonia     Past Surgical History:  Procedure Laterality Date   APPENDECTOMY      MEDICATIONS:  aspirin EC 81 MG tablet   finasteride (PROSCAR) 5 MG tablet   Polyethylene Glycol 3350 (  MIRALAX PO)   rosuvastatin (CRESTOR) 20 MG tablet   tamsulosin (FLOMAX) 0.4 MG CAPS capsule   No current facility-administered medications for this encounter.     Konrad Felix Ward, PA-C [facility] Pre-Surgical Testing [phone]

## 2021-05-07 ENCOUNTER — Encounter (HOSPITAL_COMMUNITY)
Admission: RE | Admit: 2021-05-07 | Discharge: 2021-05-07 | Disposition: A | Discharge status: Home / Self Care | Payer: Medicare Other | Source: Ambulatory Visit | Attending: Urology | Admitting: Urology

## 2021-05-07 ENCOUNTER — Other Ambulatory Visit: Payer: Self-pay

## 2021-05-09 ENCOUNTER — Encounter (HOSPITAL_COMMUNITY): Payer: Self-pay | Admitting: Urology

## 2021-05-09 ENCOUNTER — Inpatient Hospital Stay (HOSPITAL_COMMUNITY): Payer: Medicare Other | Admitting: Anesthesiology

## 2021-05-09 ENCOUNTER — Other Ambulatory Visit: Payer: Self-pay

## 2021-05-09 ENCOUNTER — Encounter (HOSPITAL_COMMUNITY)
Admission: RE | Disposition: A | Discharge status: Home / Self Care | Payer: Self-pay | Source: Ambulatory Visit | Attending: Urology

## 2021-05-09 ENCOUNTER — Inpatient Hospital Stay (HOSPITAL_COMMUNITY)
Admission: RE | Admit: 2021-05-09 | Discharge: 2021-05-11 | DRG: 717 | Disposition: A | Discharge status: Home / Self Care | Payer: Medicare Other | Source: Ambulatory Visit | Attending: Urology | Admitting: Urology

## 2021-05-09 ENCOUNTER — Inpatient Hospital Stay (HOSPITAL_COMMUNITY): Payer: Medicare Other | Admitting: Physician Assistant

## 2021-05-09 DIAGNOSIS — R338 Other retention of urine: Secondary | ICD-10-CM | POA: Diagnosis not present

## 2021-05-09 DIAGNOSIS — Z8249 Family history of ischemic heart disease and other diseases of the circulatory system: Secondary | ICD-10-CM | POA: Diagnosis not present

## 2021-05-09 DIAGNOSIS — I252 Old myocardial infarction: Secondary | ICD-10-CM

## 2021-05-09 DIAGNOSIS — N138 Other obstructive and reflux uropathy: Secondary | ICD-10-CM | POA: Diagnosis not present

## 2021-05-09 DIAGNOSIS — Z01812 Encounter for preprocedural laboratory examination: Secondary | ICD-10-CM

## 2021-05-09 DIAGNOSIS — Z9861 Coronary angioplasty status: Secondary | ICD-10-CM

## 2021-05-09 DIAGNOSIS — Z800 Family history of malignant neoplasm of digestive organs: Secondary | ICD-10-CM

## 2021-05-09 DIAGNOSIS — F32A Depression, unspecified: Secondary | ICD-10-CM | POA: Diagnosis not present

## 2021-05-09 DIAGNOSIS — E119 Type 2 diabetes mellitus without complications: Secondary | ICD-10-CM | POA: Diagnosis present

## 2021-05-09 DIAGNOSIS — Z20822 Contact with and (suspected) exposure to covid-19: Secondary | ICD-10-CM | POA: Diagnosis not present

## 2021-05-09 DIAGNOSIS — N401 Enlarged prostate with lower urinary tract symptoms: Secondary | ICD-10-CM

## 2021-05-09 DIAGNOSIS — Z87891 Personal history of nicotine dependence: Secondary | ICD-10-CM

## 2021-05-09 DIAGNOSIS — I2510 Atherosclerotic heart disease of native coronary artery without angina pectoris: Secondary | ICD-10-CM | POA: Diagnosis present

## 2021-05-09 DIAGNOSIS — N400 Enlarged prostate without lower urinary tract symptoms: Secondary | ICD-10-CM | POA: Diagnosis present

## 2021-05-09 HISTORY — PX: XI ROBOTIC ASSISTED SIMPLE PROSTATECTOMY: SHX6713

## 2021-05-09 LAB — CBC
HCT: 41.4 % (ref 39.0–52.0)
Hemoglobin: 13.5 g/dL (ref 13.0–17.0)
MCH: 31.5 pg (ref 26.0–34.0)
MCHC: 32.6 g/dL (ref 30.0–36.0)
MCV: 96.5 fL (ref 80.0–100.0)
Platelets: 255 10*3/uL (ref 150–400)
RBC: 4.29 MIL/uL (ref 4.22–5.81)
RDW: 12.6 % (ref 11.5–15.5)
WBC: 12.8 10*3/uL — ABNORMAL HIGH (ref 4.0–10.5)
nRBC: 0 % (ref 0.0–0.2)

## 2021-05-09 LAB — BASIC METABOLIC PANEL
Anion gap: 7 (ref 5–15)
BUN: 16 mg/dL (ref 8–23)
CO2: 24 mmol/L (ref 22–32)
Calcium: 8.2 mg/dL — ABNORMAL LOW (ref 8.9–10.3)
Chloride: 103 mmol/L (ref 98–111)
Creatinine, Ser: 1.34 mg/dL — ABNORMAL HIGH (ref 0.61–1.24)
GFR, Estimated: 57 mL/min — ABNORMAL LOW (ref >60)
Glucose, Bld: 115 mg/dL — ABNORMAL HIGH (ref 70–99)
Potassium: 4 mmol/L (ref 3.5–5.1)
Sodium: 134 mmol/L — ABNORMAL LOW (ref 135–145)

## 2021-05-09 LAB — SARS CORONAVIRUS 2 BY RT PCR (HOSPITAL ORDER, PERFORMED IN ~~LOC~~ HOSPITAL LAB): SARS Coronavirus 2: NEGATIVE

## 2021-05-09 LAB — GLUCOSE, CAPILLARY: Glucose-Capillary: 85 mg/dL (ref 70–99)

## 2021-05-09 LAB — SARS CORONAVIRUS 2 (TAT 6-24 HRS): SARS Coronavirus 2: NEGATIVE

## 2021-05-09 SURGERY — PROSTATECTOMY, SIMPLE, ROBOT-ASSISTED
Anesthesia: General | Site: Abdomen

## 2021-05-09 MED ORDER — POLYETHYLENE GLYCOL 3350 17 GM/SCOOP PO POWD: 1.0000 | Freq: Once | ORAL | Status: DC

## 2021-05-09 MED ORDER — ACETAMINOPHEN 160 MG/5ML PO SOLN: 325.0000 mg | ORAL | Status: DC | PRN

## 2021-05-09 MED ORDER — OXYCODONE HCL 5 MG PO TABS: 5.0000 mg | ORAL_TABLET | Freq: Once | ORAL | Status: DC | PRN

## 2021-05-09 MED ORDER — ACETAMINOPHEN 10 MG/ML IV SOLN
1000.0000 mg | Freq: Four times a day (QID) | INTRAVENOUS | Status: DC
  Administered 2021-05-09: 1000 mg via INTRAVENOUS

## 2021-05-09 MED ORDER — TAMSULOSIN HCL 0.4 MG PO CAPS
0.8000 mg | ORAL_CAPSULE | Freq: Every day | ORAL | Status: DC
  Administered 2021-05-09 – 2021-05-10 (×2): 0.8 mg via ORAL
  Filled 2021-05-09 (×3): qty 2

## 2021-05-09 MED ORDER — PROPOFOL 10 MG/ML IV BOLUS
INTRAVENOUS | Status: DC | PRN
  Administered 2021-05-09: 150 mg via INTRAVENOUS

## 2021-05-09 MED ORDER — OXYCODONE HCL 5 MG/5ML PO SOLN: 5.0000 mg | Freq: Once | ORAL | Status: DC | PRN

## 2021-05-09 MED ORDER — SODIUM CHLORIDE 0.9% FLUSH
3.0000 mL | Freq: Two times a day (BID) | INTRAVENOUS | Status: DC
  Administered 2021-05-09: 3 mL via INTRAVENOUS

## 2021-05-09 MED ORDER — HYDROMORPHONE HCL 1 MG/ML IJ SOLN
0.5000 mg | INTRAMUSCULAR | Status: DC | PRN
  Administered 2021-05-09: 1 mg via INTRAVENOUS
  Administered 2021-05-09: 0.5 mg via INTRAVENOUS
  Administered 2021-05-10: 1 mg via INTRAVENOUS
  Filled 2021-05-09 (×2): qty 1

## 2021-05-09 MED ORDER — HYDROMORPHONE HCL 1 MG/ML IJ SOLN
INTRAMUSCULAR | Status: AC
  Filled 2021-05-09: qty 1

## 2021-05-09 MED ORDER — LACTATED RINGERS IR SOLN
Status: DC | PRN
  Administered 2021-05-09: 1000 mL

## 2021-05-09 MED ORDER — SODIUM CHLORIDE 0.9 % IV SOLN: INTRAVENOUS | Status: DC

## 2021-05-09 MED ORDER — ACETAMINOPHEN 500 MG PO TABS
1000.0000 mg | ORAL_TABLET | Freq: Four times a day (QID) | ORAL | Status: DC
  Administered 2021-05-10 – 2021-05-11 (×3): 1000 mg via ORAL
  Filled 2021-05-09 (×3): qty 2

## 2021-05-09 MED ORDER — CEFAZOLIN SODIUM-DEXTROSE 1-4 GM/50ML-% IV SOLN
1.0000 g | Freq: Three times a day (TID) | INTRAVENOUS | Status: AC
  Administered 2021-05-09 – 2021-05-10 (×2): 1 g via INTRAVENOUS
  Filled 2021-05-09 (×2): qty 50

## 2021-05-09 MED ORDER — FINASTERIDE 5 MG PO TABS
5.0000 mg | ORAL_TABLET | Freq: Every day | ORAL | Status: DC
  Administered 2021-05-09 – 2021-05-11 (×3): 5 mg via ORAL
  Filled 2021-05-09 (×3): qty 1

## 2021-05-09 MED ORDER — ORAL CARE MOUTH RINSE
15.0000 mL | Freq: Once | OROMUCOSAL | Status: AC
  Administered 2021-05-09: 15 mL via OROMUCOSAL

## 2021-05-09 MED ORDER — ONDANSETRON HCL 4 MG/2ML IJ SOLN: 4.0000 mg | Freq: Once | INTRAMUSCULAR | Status: DC | PRN

## 2021-05-09 MED ORDER — ONDANSETRON HCL 4 MG/2ML IJ SOLN
INTRAMUSCULAR | Status: AC
  Filled 2021-05-09: qty 4

## 2021-05-09 MED ORDER — SODIUM CHLORIDE 0.9 % IV BOLUS
1000.0000 mL | Freq: Once | INTRAVENOUS | Status: AC
  Administered 2021-05-09: 1000 mL via INTRAVENOUS

## 2021-05-09 MED ORDER — SUFENTANIL CITRATE 50 MCG/ML IV SOLN
INTRAVENOUS | Status: DC | PRN
  Administered 2021-05-09 (×5): 10 ug via INTRAVENOUS

## 2021-05-09 MED ORDER — ACETAMINOPHEN 10 MG/ML IV SOLN
INTRAVENOUS | Status: AC
  Filled 2021-05-09: qty 100

## 2021-05-09 MED ORDER — TRAMADOL HCL 50 MG PO TABS
100.0000 mg | ORAL_TABLET | Freq: Four times a day (QID) | ORAL | Status: DC | PRN
  Administered 2021-05-10: 100 mg via ORAL
  Filled 2021-05-09: qty 2

## 2021-05-09 MED ORDER — DOCUSATE SODIUM 100 MG PO CAPS
100.0000 mg | ORAL_CAPSULE | Freq: Two times a day (BID) | ORAL | Status: DC
Start: 2021-05-09 — End: 2022-12-09

## 2021-05-09 MED ORDER — SODIUM CHLORIDE (PF) 0.9 % IJ SOLN
INTRAMUSCULAR | Status: AC
  Filled 2021-05-09: qty 10

## 2021-05-09 MED ORDER — PROPOFOL 10 MG/ML IV BOLUS
INTRAVENOUS | Status: AC
  Filled 2021-05-09: qty 20

## 2021-05-09 MED ORDER — SODIUM CHLORIDE (PF) 0.9 % IJ SOLN
INTRAMUSCULAR | Status: DC | PRN
  Administered 2021-05-09: 20 mL

## 2021-05-09 MED ORDER — FENTANYL CITRATE (PF) 100 MCG/2ML IJ SOLN
INTRAMUSCULAR | Status: AC
  Filled 2021-05-09: qty 2

## 2021-05-09 MED ORDER — LIDOCAINE HCL (PF) 2 % IJ SOLN
INTRAMUSCULAR | Status: AC
  Filled 2021-05-09: qty 5

## 2021-05-09 MED ORDER — BACITRACIN-NEOMYCIN-POLYMYXIN 400-5-5000 EX OINT: 1.0000 "application " | TOPICAL_OINTMENT | Freq: Three times a day (TID) | CUTANEOUS | Status: DC | PRN

## 2021-05-09 MED ORDER — SODIUM CHLORIDE (PF) 0.9 % IJ SOLN
INTRAMUSCULAR | Status: AC
  Filled 2021-05-09: qty 20

## 2021-05-09 MED ORDER — SUFENTANIL CITRATE 50 MCG/ML IV SOLN
INTRAVENOUS | Status: AC
  Filled 2021-05-09: qty 1

## 2021-05-09 MED ORDER — SUGAMMADEX SODIUM 200 MG/2ML IV SOLN
INTRAVENOUS | Status: DC | PRN
  Administered 2021-05-09: 180 mg via INTRAVENOUS

## 2021-05-09 MED ORDER — CEFAZOLIN SODIUM-DEXTROSE 2-4 GM/100ML-% IV SOLN
2.0000 g | INTRAVENOUS | Status: AC
  Administered 2021-05-09: 2 g via INTRAVENOUS
  Filled 2021-05-09: qty 100

## 2021-05-09 MED ORDER — BUPIVACAINE LIPOSOME 1.3 % IJ SUSP
INTRAMUSCULAR | Status: AC
  Filled 2021-05-09: qty 20

## 2021-05-09 MED ORDER — ROCURONIUM BROMIDE 10 MG/ML (PF) SYRINGE
PREFILLED_SYRINGE | INTRAVENOUS | Status: AC
  Filled 2021-05-09: qty 10

## 2021-05-09 MED ORDER — DOCUSATE SODIUM 100 MG PO CAPS
100.0000 mg | ORAL_CAPSULE | Freq: Two times a day (BID) | ORAL | Status: DC
  Administered 2021-05-09 – 2021-05-11 (×4): 100 mg via ORAL
  Filled 2021-05-09 (×4): qty 1

## 2021-05-09 MED ORDER — ROCURONIUM BROMIDE 10 MG/ML (PF) SYRINGE
PREFILLED_SYRINGE | INTRAVENOUS | Status: DC | PRN
  Administered 2021-05-09: 20 mg via INTRAVENOUS
  Administered 2021-05-09: 10 mg via INTRAVENOUS
  Administered 2021-05-09: 60 mg via INTRAVENOUS
  Administered 2021-05-09: 20 mg via INTRAVENOUS

## 2021-05-09 MED ORDER — SULFAMETHOXAZOLE-TRIMETHOPRIM 800-160 MG PO TABS: 1.0000 | ORAL_TABLET | Freq: Two times a day (BID) | ORAL | 0 refills | Status: DC

## 2021-05-09 MED ORDER — TRAMADOL HCL 50 MG PO TABS
50.0000 mg | ORAL_TABLET | Freq: Four times a day (QID) | ORAL | Status: DC | PRN
  Administered 2021-05-09 – 2021-05-11 (×2): 50 mg via ORAL
  Filled 2021-05-09 (×2): qty 1

## 2021-05-09 MED ORDER — MEPERIDINE HCL 50 MG/ML IJ SOLN: 6.2500 mg | INTRAMUSCULAR | Status: DC | PRN

## 2021-05-09 MED ORDER — SODIUM CHLORIDE 0.9 % IV SOLN: 250.0000 mL | INTRAVENOUS | Status: DC | PRN

## 2021-05-09 MED ORDER — DEXAMETHASONE SODIUM PHOSPHATE 10 MG/ML IJ SOLN
INTRAMUSCULAR | Status: AC
  Filled 2021-05-09: qty 1

## 2021-05-09 MED ORDER — FENTANYL CITRATE PF 50 MCG/ML IJ SOSY
25.0000 ug | PREFILLED_SYRINGE | INTRAMUSCULAR | Status: DC | PRN
  Administered 2021-05-09: 25 ug via INTRAVENOUS
  Administered 2021-05-09: 50 ug via INTRAVENOUS
  Administered 2021-05-09: 25 ug via INTRAVENOUS

## 2021-05-09 MED ORDER — FENTANYL CITRATE PF 50 MCG/ML IJ SOSY
PREFILLED_SYRINGE | INTRAMUSCULAR | Status: AC
  Filled 2021-05-09: qty 1

## 2021-05-09 MED ORDER — CHLORHEXIDINE GLUCONATE 0.12 % MT SOLN: 15.0000 mL | Freq: Once | OROMUCOSAL | Status: AC

## 2021-05-09 MED ORDER — LACTATED RINGERS IV SOLN: INTRAVENOUS | Status: DC

## 2021-05-09 MED ORDER — DEXAMETHASONE SODIUM PHOSPHATE 10 MG/ML IJ SOLN
INTRAMUSCULAR | Status: DC | PRN
  Administered 2021-05-09: 10 mg via INTRAVENOUS

## 2021-05-09 MED ORDER — SODIUM CHLORIDE 0.9% FLUSH: 3.0000 mL | INTRAVENOUS | Status: DC | PRN

## 2021-05-09 MED ORDER — BUPIVACAINE LIPOSOME 1.3 % IJ SUSP
INTRAMUSCULAR | Status: DC | PRN
  Administered 2021-05-09: 20 mL

## 2021-05-09 MED ORDER — ONDANSETRON HCL 4 MG/2ML IJ SOLN
INTRAMUSCULAR | Status: DC | PRN
  Administered 2021-05-09: 4 mg via INTRAVENOUS

## 2021-05-09 MED ORDER — ONDANSETRON HCL 4 MG/2ML IJ SOLN
4.0000 mg | INTRAMUSCULAR | Status: DC | PRN
  Administered 2021-05-09 – 2021-05-10 (×2): 4 mg via INTRAVENOUS
  Filled 2021-05-09 (×2): qty 2

## 2021-05-09 MED ORDER — HYDROCODONE-ACETAMINOPHEN 5-325 MG PO TABS: 1.0000 | ORAL_TABLET | Freq: Four times a day (QID) | ORAL | 0 refills | Status: DC | PRN

## 2021-05-09 MED ORDER — STERILE WATER FOR IRRIGATION IR SOLN
Status: DC | PRN
  Administered 2021-05-09: 1000 mL

## 2021-05-09 MED ORDER — FENTANYL CITRATE (PF) 100 MCG/2ML IJ SOLN
INTRAMUSCULAR | Status: DC | PRN
  Administered 2021-05-09 (×2): 50 ug via INTRAVENOUS

## 2021-05-09 MED ORDER — ACETAMINOPHEN 325 MG PO TABS: 325.0000 mg | ORAL_TABLET | ORAL | Status: DC | PRN

## 2021-05-09 MED ORDER — LIDOCAINE 2% (20 MG/ML) 5 ML SYRINGE
INTRAMUSCULAR | Status: DC | PRN
  Administered 2021-05-09: 100 mg via INTRAVENOUS

## 2021-05-09 SURGICAL SUPPLY — 72 items
ADH SKN CLS APL DERMABOND .7 (GAUZE/BANDAGES/DRESSINGS) ×1
APL PRP STRL LF DISP 70% ISPRP (MISCELLANEOUS) ×1
APL SWBSTK 6 STRL LF DISP (MISCELLANEOUS) ×1
APPLICATOR COTTON TIP 6 STRL (MISCELLANEOUS) ×1 IMPLANT
APPLICATOR COTTON TIP 6IN STRL (MISCELLANEOUS) ×2
BAG COUNTER SPONGE SURGICOUNT (BAG) IMPLANT
BAG SPNG CNTER NS LX DISP (BAG)
CATH FOLEY 2WAY SLVR 18FR 30CC (CATHETERS) ×2 IMPLANT
CATH FOLEY 3WAY 30CC 24FR (CATHETERS) ×2
CATH TIEMANN FOLEY 18FR 5CC (CATHETERS) IMPLANT
CATH URTH STD 24FR FL 3W 2 (CATHETERS) ×1 IMPLANT
CHLORAPREP W/TINT 26 (MISCELLANEOUS) ×2 IMPLANT
CLIP LIGATING HEM O LOK PURPLE (MISCELLANEOUS) IMPLANT
CLOTH BEACON ORANGE TIMEOUT ST (SAFETY) ×2 IMPLANT
COVER SURGICAL LIGHT HANDLE (MISCELLANEOUS) ×2 IMPLANT
COVER TIP SHEARS 8 DVNC (MISCELLANEOUS) ×1 IMPLANT
COVER TIP SHEARS 8MM DA VINCI (MISCELLANEOUS) ×1
CUTTER ECHEON FLEX ENDO 45 340 (ENDOMECHANICALS) IMPLANT
DERMABOND ADVANCED (GAUZE/BANDAGES/DRESSINGS) ×1
DERMABOND ADVANCED .7 DNX12 (GAUZE/BANDAGES/DRESSINGS) ×1 IMPLANT
DRAIN CHANNEL RND F F (WOUND CARE) IMPLANT
DRAPE ARM DVNC X/XI (DISPOSABLE) ×4 IMPLANT
DRAPE COLUMN DVNC XI (DISPOSABLE) ×1 IMPLANT
DRAPE DA VINCI XI ARM (DISPOSABLE) ×4
DRAPE DA VINCI XI COLUMN (DISPOSABLE) ×1
DRAPE SURG IRRIG POUCH 19X23 (DRAPES) ×2 IMPLANT
DRSG TEGADERM 4X4.75 (GAUZE/BANDAGES/DRESSINGS) ×4 IMPLANT
ELECT PENCIL ROCKER SW 15FT (MISCELLANEOUS) ×2 IMPLANT
ELECT REM PT RETURN 15FT ADLT (MISCELLANEOUS) ×2 IMPLANT
GLOVE SURG ENC MOIS LTX SZ6.5 (GLOVE) ×2 IMPLANT
GLOVE SURG ENC TEXT LTX SZ7.5 (GLOVE) ×4 IMPLANT
GLOVE SURG UNDER POLY LF SZ7.5 (GLOVE) ×2 IMPLANT
GOWN STRL REUS W/TWL LRG LVL3 (GOWN DISPOSABLE) ×6 IMPLANT
HOLDER FOLEY CATH W/STRAP (MISCELLANEOUS) ×2 IMPLANT
IRRIG SUCT STRYKERFLOW 2 WTIP (MISCELLANEOUS) ×2
IRRIGATION SUCT STRKRFLW 2 WTP (MISCELLANEOUS) ×1 IMPLANT
IV LACTATED RINGERS 1000ML (IV SOLUTION) ×2 IMPLANT
KIT TURNOVER KIT A (KITS) IMPLANT
NDL INSUFFLATION 14GA 120MM (NEEDLE) ×1 IMPLANT
NEEDLE INSUFFLATION 14GA 120MM (NEEDLE) ×2 IMPLANT
PACK ROBOT UROLOGY CUSTOM (CUSTOM PROCEDURE TRAY) ×2 IMPLANT
PAD POSITIONING PINK XL (MISCELLANEOUS) ×2 IMPLANT
PENCIL SMOKE EVACUATOR (MISCELLANEOUS) IMPLANT
PORT ACCESS TROCAR AIRSEAL 12 (TROCAR) ×1 IMPLANT
PORT ACCESS TROCAR AIRSEAL 5M (TROCAR) ×1
RELOAD STAPLE 45 4.1 GRN THCK (STAPLE) IMPLANT
SEAL CANN UNIV 5-8 DVNC XI (MISCELLANEOUS) ×4 IMPLANT
SEAL XI 5MM-8MM UNIVERSAL (MISCELLANEOUS) ×4
SET TRI-LUMEN FLTR TB AIRSEAL (TUBING) ×2 IMPLANT
SOLUTION ELECTROLUBE (MISCELLANEOUS) ×2 IMPLANT
SPIKE FLUID TRANSFER (MISCELLANEOUS) ×2 IMPLANT
SPONGE T-LAP 4X18 ~~LOC~~+RFID (SPONGE) ×2 IMPLANT
STAPLE RELOAD 45 GRN (STAPLE) ×1 IMPLANT
STAPLE RELOAD 45MM GREEN (STAPLE) ×2
SUT ETHILON 3 0 PS 1 (SUTURE) ×2 IMPLANT
SUT MNCRL AB 4-0 PS2 18 (SUTURE) ×4 IMPLANT
SUT PDS AB 1 CT1 27 (SUTURE) ×4 IMPLANT
SUT V-LOC BARB 180 2/0GR6 GS22 (SUTURE) ×4
SUT VIC AB 0 CT1 27 (SUTURE) ×8
SUT VIC AB 0 CT1 27XBRD ANTBC (SUTURE) ×3 IMPLANT
SUT VIC AB 2-0 SH 27 (SUTURE) ×2
SUT VIC AB 2-0 SH 27X BRD (SUTURE) ×1 IMPLANT
SUT VICRYL 0 UR6 27IN ABS (SUTURE) ×2 IMPLANT
SUT VLOC 180 2-0 9IN GS21 (SUTURE) ×2 IMPLANT
SUT VLOC 3-0 9IN GRN (SUTURE) ×2 IMPLANT
SUT VLOC BARB 180 ABS3/0GR12 (SUTURE) ×4
SUTURE V-LC BRB 180 2/0GR6GS22 (SUTURE) ×2 IMPLANT
SUTURE VLOC BRB 180 ABS3/0GR12 (SUTURE) ×2 IMPLANT
TOWEL OR NON WOVEN STRL DISP B (DISPOSABLE) ×2 IMPLANT
TROCAR BLADELESS OPT 5 100 (ENDOMECHANICALS) IMPLANT
TROCAR XCEL NON-BLD 5MMX100MML (ENDOMECHANICALS) IMPLANT
WATER STERILE IRR 1000ML POUR (IV SOLUTION) ×2 IMPLANT

## 2021-05-09 NOTE — Discharge Instructions (Addendum)

## 2021-05-09 NOTE — H&P (Signed)
John Spencer is an 71 y.o. male.    Chief Complaint:   HPI:   1 - Massive Prostate With Urinary Retnetion - baseline PVR <150mL. in / out of retention 2022 on max dose alpha blockers. Prostate 240gm by MRI calculation 2021 with large intravesical protrusion / median.   2 - Low Risk Prostate Cancer - lwo grade disease DX by Hemal at [facility] per report. FU BX negative and low risk MRI.   PMH sig for appy. NO ichemic CV disease / blood thiners. Had negative cards eval for bradycardia. 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 His PCP is Marda [facility]alker PA with [facility].   Today ' [name] " is seen to proceed with robotic simple prostatectomy for recurrent urinary retention.     Past Medical History:  Diagnosis Date   At average risk for colon cancer    BPH (benign prostatic hyperplasia)    Cancer (HCC)    Coronary artery disease    Prior PCI x4, per patient.   Depression    Diabetes mellitus without complication ([place])    Heart attack ([place])    Internal carotid artery stenosis, left    Pneumonia     Past Surgical History:  Procedure Laterality Date   APPENDECTOMY      Family History  Problem Relation Age of Onset   Heart disease Mother    Hypertension Mother    Heart disease Father    Hypertension Father    Colon cancer Sister    Hypertension Brother    Social History:  reports that he quit smoking about 31 years ago. His smoking use included cigarettes. He has a 3.00 pack-year smoking history. He has never used smokeless tobacco. He reports that he does not currently use drugs after having used the following drugs: Marijuana. He reports that he does not drink alcohol.  Allergies: No Known Allergies  No medications prior to admission.    No results found for this or any previous visit (from the past 48 hour(s)). No results found.  Review of Systems  Constitutional:  Negative for chills and fever.  All other systems reviewed and are  negative.  There were no vitals taken for this visit. Physical Exam Vitals reviewed.  HENT:     Head: Normocephalic.  Eyes:     Pupils: Pupils are equal, round, and reactive to light.  Cardiovascular:     Rate and Rhythm: Normal rate.  Abdominal:     General: Abdomen is flat.  Genitourinary:    Comments: No CVAT Musculoskeletal:        General: Normal range of motion.     Cervical back: Normal range of motion.  Neurological:     General: No focal deficit present.     Mental Status: He is alert.  Psychiatric:        Mood and Affect: Mood normal.     Assessment/Plan  Proceed as planned with robotic simple prostatectomy. Risks, benefits, alternatives, expected peri-op course discussed previously and reiterated today.   Alexis Frock, MD 05/09/2021, 8:50 AM

## 2021-05-09 NOTE — Transfer of Care (Signed)
Immediate Anesthesia Transfer of Care Note  Patient: Mayford Alberg  Procedure(s) Performed: XI ROBOTIC ASSISTED SIMPLE PROSTATECTOMY (Abdomen)  Patient Location: PACU  Anesthesia Type:General  Level of Consciousness: awake  Airway & Oxygen Therapy: Patient Spontanous Breathing and Patient connected to face mask oxygen  Post-op Assessment: Report given to RN and Post -op Vital signs reviewed and stable  Post vital signs: Reviewed and stable  Last Vitals:  Vitals Value Taken Time  BP 168/75 05/09/21 1541  Temp    Pulse 87 05/09/21 1542  Resp 23 05/09/21 1542  SpO2 100 % 05/09/21 1542  Vitals shown include unvalidated device data.  Last Pain:  Vitals:   05/09/21 1036  TempSrc: Oral         Complications: No notable events documented.

## 2021-05-09 NOTE — Anesthesia Preprocedure Evaluation (Addendum)
Anesthesia Evaluation  Patient identified by MRN, date of birth, ID band Patient awake    Reviewed: Allergy & Precautions, H&P , NPO status , Patient's Chart, lab work & pertinent test results, reviewed documented beta blocker date and time   Airway Mallampati: I  TM Distance: >3 FB Neck ROM: full    Dental no notable dental hx. (+) Edentulous Upper, Missing, Poor Dentition, Dental Advisory Given,    Pulmonary neg pulmonary ROS, former smoker,    Pulmonary exam normal breath sounds clear to auscultation       Cardiovascular Exercise Tolerance: Good + CAD, + Past MI and + Cardiac Stents   Rhythm:regular Rate:Normal  Myoview 6/22 Normal myocardial perfusion.  Without reversible ischemia.  Low risk study.   Neuro/Psych PSYCHIATRIC DISORDERS Depression negative neurological ROS     GI/Hepatic negative GI ROS, Neg liver ROS,   Endo/Other  negative endocrine ROSdiabetes  Renal/GU negative Renal ROS  negative genitourinary   Musculoskeletal   Abdominal   Peds  Hematology negative hematology ROS (+)   Anesthesia Other Findings   Reproductive/Obstetrics negative OB ROS                            Anesthesia Physical Anesthesia Plan  ASA: 3  Anesthesia Plan: General   Post-op Pain Management:    Induction: Intravenous  PONV Risk Score and Plan: Ondansetron and Dexamethasone  Airway Management Planned: Oral ETT  Additional Equipment: None  Intra-op Plan:   Post-operative Plan: Extubation in OR  Informed Consent: I have reviewed the patients History and Physical, chart, labs and discussed the procedure including the risks, benefits and alternatives for the proposed anesthesia with the patient or authorized representative who has indicated his/her understanding and acceptance.     Dental Advisory Given  Plan Discussed with: CRNA and Anesthesiologist  Anesthesia Plan Comments:  (  )        Anesthesia Quick Evaluation

## 2021-05-09 NOTE — Anesthesia Procedure Notes (Signed)
Procedure Name: Intubation Date/Time: 05/09/2021 1:05 PM Performed by: Sharlette Dense, CRNA Pre-anesthesia Checklist: Patient identified, Emergency Drugs available, Suction available and Patient being monitored Patient Re-evaluated:Patient Re-evaluated prior to induction Oxygen Delivery Method: Circle system utilized Preoxygenation: Pre-oxygenation with 100% oxygen Induction Type: IV induction Ventilation: Mask ventilation without difficulty and Oral airway inserted - appropriate to patient size Laryngoscope Size: Miller and 3 Grade View: Grade I Tube type: Oral Tube size: 8.0 mm Number of attempts: 1 Airway Equipment and Method: Stylet Placement Confirmation: ETT inserted through vocal cords under direct vision, positive ETCO2 and breath sounds checked- equal and bilateral Secured at: 23 cm Tube secured with: Tape Dental Injury: Teeth and Oropharynx as per pre-operative assessment

## 2021-05-09 NOTE — Brief Op Note (Signed)
05/09/2021  3:27 PM  PATIENT:  John Spencer  71 y.o. male  PRE-OPERATIVE DIAGNOSIS:  MASSIVE PROSTATE , RETENTION  POST-OPERATIVE DIAGNOSIS:  MASSIVE PROSTATE , RETENTION  PROCEDURE:  Procedure(s): XI ROBOTIC ASSISTED SIMPLE PROSTATECTOMY (N/A)  SURGEON:  Surgeon(s) and Role:    Alexis Frock, MD - Primary  PHYSICIAN ASSISTANT:   ASSISTANTS: Debbrah Alar PA   ANESTHESIA:   local and general  EBL:  400 mL   BLOOD ADMINISTERED:none  DRAINS:  1 - JP to bulb; 2 - FOley to gravity    LOCAL MEDICATIONS USED:  MARCAINE     SPECIMEN:  Source of Specimen:  prostate adenoma  DISPOSITION OF SPECIMEN:  PATHOLOGY  COUNTS:  YES  TOURNIQUET:  * No tourniquets in log *  DICTATION: .Other Dictation: Dictation Number [id number]  PLAN OF CARE: Admit to inpatient   PATIENT DISPOSITION:  PACU - hemodynamically stable.   Delay start of Pharmacological VTE agent (>24hrs) due to surgical blood loss or risk of bleeding: yes

## 2021-05-10 ENCOUNTER — Other Ambulatory Visit: Payer: Self-pay

## 2021-05-10 DIAGNOSIS — Z800 Family history of malignant neoplasm of digestive organs: Secondary | ICD-10-CM | POA: Diagnosis not present

## 2021-05-10 DIAGNOSIS — Z87891 Personal history of nicotine dependence: Secondary | ICD-10-CM | POA: Diagnosis not present

## 2021-05-10 DIAGNOSIS — Z9861 Coronary angioplasty status: Secondary | ICD-10-CM | POA: Diagnosis not present

## 2021-05-10 DIAGNOSIS — E119 Type 2 diabetes mellitus without complications: Secondary | ICD-10-CM | POA: Diagnosis present

## 2021-05-10 DIAGNOSIS — I2510 Atherosclerotic heart disease of native coronary artery without angina pectoris: Secondary | ICD-10-CM | POA: Diagnosis present

## 2021-05-10 DIAGNOSIS — N138 Other obstructive and reflux uropathy: Secondary | ICD-10-CM | POA: Diagnosis present

## 2021-05-10 DIAGNOSIS — Z20822 Contact with and (suspected) exposure to covid-19: Secondary | ICD-10-CM | POA: Diagnosis present

## 2021-05-10 DIAGNOSIS — F32A Depression, unspecified: Secondary | ICD-10-CM | POA: Diagnosis present

## 2021-05-10 DIAGNOSIS — N401 Enlarged prostate with lower urinary tract symptoms: Secondary | ICD-10-CM | POA: Diagnosis present

## 2021-05-10 DIAGNOSIS — I252 Old myocardial infarction: Secondary | ICD-10-CM | POA: Diagnosis not present

## 2021-05-10 DIAGNOSIS — Z8249 Family history of ischemic heart disease and other diseases of the circulatory system: Secondary | ICD-10-CM | POA: Diagnosis not present

## 2021-05-10 LAB — BASIC METABOLIC PANEL
Anion gap: 6 (ref 5–15)
BUN: 22 mg/dL (ref 8–23)
CO2: 25 mmol/L (ref 22–32)
Calcium: 8.1 mg/dL — ABNORMAL LOW (ref 8.9–10.3)
Chloride: 103 mmol/L (ref 98–111)
Creatinine, Ser: 1.51 mg/dL — ABNORMAL HIGH (ref 0.61–1.24)
GFR, Estimated: 49 mL/min — ABNORMAL LOW (ref >60)
Glucose, Bld: 125 mg/dL — ABNORMAL HIGH (ref 70–99)
Potassium: 4.8 mmol/L (ref 3.5–5.1)
Sodium: 134 mmol/L — ABNORMAL LOW (ref 135–145)

## 2021-05-10 LAB — CBC
HCT: 38.5 % — ABNORMAL LOW (ref 39.0–52.0)
Hemoglobin: 12.5 g/dL — ABNORMAL LOW (ref 13.0–17.0)
MCH: 31.3 pg (ref 26.0–34.0)
MCHC: 32.5 g/dL (ref 30.0–36.0)
MCV: 96.3 fL (ref 80.0–100.0)
Platelets: 250 10*3/uL (ref 150–400)
RBC: 4 MIL/uL — ABNORMAL LOW (ref 4.22–5.81)
RDW: 12.4 % (ref 11.5–15.5)
WBC: 11.2 10*3/uL — ABNORMAL HIGH (ref 4.0–10.5)
nRBC: 0 % (ref 0.0–0.2)

## 2021-05-10 MED ORDER — CHLORHEXIDINE GLUCONATE CLOTH 2 % EX PADS
6.0000 | MEDICATED_PAD | Freq: Every day | CUTANEOUS | Status: DC
  Administered 2021-05-10: 6 via TOPICAL

## 2021-05-10 MED ORDER — ZOLPIDEM TARTRATE 5 MG PO TABS
5.0000 mg | ORAL_TABLET | Freq: Every evening | ORAL | Status: DC | PRN
  Administered 2021-05-10: 2.5 mg via ORAL
  Administered 2021-05-10: 5 mg via ORAL
  Filled 2021-05-10 (×2): qty 1

## 2021-05-10 NOTE — Progress Notes (Signed)
1 Day Post-Op Subjective: Patient c/o lower abd pain. Stable overnight.  Objective: Vital signs in last 24 hours: Temp:  [98 F (36.7 C)-98.5 F (36.9 C)] 98.4 F (36.9 C) (02/11 0642) Pulse Rate:  [56-92] 56 (02/11 0642) Resp:  [16-25] 20 (02/11 0642) BP: (117-168)/(53-80) 138/55 (02/11 0642) SpO2:  [92 %-100 %] 97 % (02/11 0642)  Intake/Output from previous day: 02/10 0701 - 02/11 0700 In: 2000 [I.V.:1900; IV Piggyback:100] Out: 1150 [Urine:750; Blood:400] Intake/Output this shift: No intake/output data recorded.  Physical Exam:  Constitutional: Vital signs reviewed. WD WN in NAD   Eyes: PERRL, No scleral icterus.   Cardiovascular: RRR Pulmonary/Chest: Normal effort Abdominal: Soft.  Appropriate tenderness. Port sites C/D/I. Extremities: No cyanosis or edema   Lab Results: Recent Labs    05/09/21 1623 05/10/21 0450  HGB 13.5 12.5*  HCT 41.4 38.5*   BMET Recent Labs    05/09/21 1623 05/10/21 0450  NA 134* 134*  K 4.0 4.8  CL 103 103  CO2 24 25  GLUCOSE 115* 125*  BUN 16 22  CREATININE 1.34* 1.51*  CALCIUM 8.2* 8.1*   No results for input(s): LABPT, INR in the last 72 hours. No results for input(s): LABURIN in the last 72 hours. Results for orders placed or performed during the hospital encounter of 05/09/21  SARS CORONAVIRUS 2 (TAT 6-24 HRS) Nasopharyngeal Nasopharyngeal Swab     Status: None   Collection Time: 05/09/21 10:24 AM   Specimen: Nasopharyngeal Swab  Result Value Ref Range Status   SARS Coronavirus 2 NEGATIVE NEGATIVE Final    Comment: (NOTE) SARS-CoV-2 target nucleic acids are NOT DETECTED.  The SARS-CoV-2 RNA is generally detectable in upper and lower respiratory specimens during the acute phase of infection. Negative results do not preclude SARS-CoV-2 infection, do not rule out co-infections with other pathogens, and should not be used as the sole basis for treatment or other patient management decisions. Negative results must be  combined with clinical observations, patient history, and epidemiological information. The expected result is Negative.  Fact Sheet for Patients: SugarRoll.be  Fact Sheet for Healthcare Providers: https://www.woods-mathews.com/  This test is not yet approved or cleared by the [place] FDA and  has been authorized for detection and/or diagnosis of SARS-CoV-2 by FDA under an Emergency Use Authorization (EUA). This EUA will remain  in effect (meaning this test can be used) for the duration of the COVID-19 declaration under Se ction 564(b)(1) of the Act, 21 U.S.C. section 360bbb-3(b)(1), unless the authorization is terminated or revoked sooner.  Performed at [facility], [place] [address] [place], [place] [address]   SARS Coronavirus 2 by RT PCR (hospital order, performed in [facility] hospital lab) Nasopharyngeal Nasopharyngeal Swab     Status: None   Collection Time: 05/09/21 11:52 AM   Specimen: Nasopharyngeal Swab  Result Value Ref Range Status   SARS Coronavirus 2 NEGATIVE NEGATIVE Final    Comment: (NOTE) SARS-CoV-2 target nucleic acids are NOT DETECTED.  The SARS-CoV-2 RNA is generally detectable in upper and lower respiratory specimens during the acute phase of infection. The lowest concentration of SARS-CoV-2 viral copies this assay can detect is 250 copies / mL. A negative result does not preclude SARS-CoV-2 infection and should not be used as the sole basis for treatment or other patient management decisions.  A negative result may occur with improper specimen collection / handling, submission of specimen other than nasopharyngeal swab, presence of viral mutation(s) within the areas targeted by this assay,  and inadequate number of viral copies (<250 copies / mL). A negative result must be combined with clinical observations, patient history, and epidemiological information.  Fact Sheet for Patients:    StrictlyIdeas.no  Fact Sheet for Healthcare Providers: BankingDealers.co.za  This test is not yet approved or  cleared by the [place] FDA and has been authorized for detection and/or diagnosis of SARS-CoV-2 by FDA under an Emergency Use Authorization (EUA).  This EUA will remain in effect (meaning this test can be used) for the duration of the COVID-19 declaration under Section 564(b)(1) of the Act, 21 U.S.C. section 360bbb-3(b)(1), unless the authorization is terminated or revoked sooner.  Performed at [facility], [place] [address] [place], [place] [address]     Studies/Results: No results found.  Assessment/Plan:  Postoperative day #1 robotic assisted simple prostatectomy.  Overall stable.  He is having some pain and just requested pain medicine.  His hemoglobin is fairly stable/appropriate for recent surgery.  His urine is slightly bloody but without clots.  JP drainage not recorded but the bulb is only a third full.  We will continue hospitalization for pain management.  I advanced his diet and will decrease IV fluid rate.   LOS: 1 day   Jorja Loa 05/10/2021, 8:22 AM

## 2021-05-10 NOTE — Op Note (Signed)
NAMEJERYN, John Spencer MEDICAL RECORD NO: [id number] ACCOUNT NO: [id number] DATE OF BIRTH: 1950-06-10 FACILITY: [facility] LOCATION: [id number] PHYSICIAN: Alexis Frock, MD  Operative Report   DATE OF PROCEDURE: 05/09/2021  PREOPERATIVE DIAGNOSES:  Massive prostate with urinary retention.  PROCEDURE:  Robotic-assisted laparoscopic simple prostatectomy.  ESTIMATED BLOOD LOSS:  370 mL  COMPLICATIONS:  None.  SPECIMEN:  Prostate adenoma for permanent pathology.  FINDINGS:  Very large bilobar prostatic hypertrophy.  ASSISTANT:  Debbrah Alar, PA    DRAINS:  1.  Jackson-Pratt drain to bulb suction. 2.  Foley catheter to straight drain.  INDICATIONS:  The patient is a pleasant 71 year old man with long history of obstructive voiding.  He has developed frank urinary retention that has been medication refractory for several weeks.  He has known massive bilobar prostatic hypertrophy.   Options were discussed including continued catheterization versus intermittent catheterization versus outlet procedures and with the prostate volume over 200 grams, outlet procedure choice being simple prostatectomy.  He wished to proceed with a goal of  catheter free.  Informed consent was obtained and placed in the medical record.    DESCRIPTION OF PROCEDURE:  The patient being himself verified, procedure being simple prostatectomy was confirmed. Procedure timeout was performed.  Intravenous antibiotics administered.  General endotracheal anesthesia induced.  The patient was placed  in low lithotomy position.  A sterile field was created, prepping and draping the base of penis, perineum and proximal thigh using iodine and his infra-xiphoid abdomen using chlorhexidine gluconate after clipper shaving.  He was further fashioned to the  operative table using 3-inch tape over foam padding across the supraxiphoid chest.  His arms were tucked to the side using gel rolls.  A new Foley catheter was placed free to straight drain.   High flow, low pressure pneumoperitoneum was obtained using  Veress technique in the supraumbilical midline having passed the aspiration and drop test.  An 8 mm robotic camera port was then placed in the same location. Laparoscopic examination of the peritoneal cavity revealed no significant adhesions, no visceral  injury.  Additional ports were placed as follows:  Right paramedian 8 mm robotic port, right far lateral 12 mm AirSeal assistant port, right paramedian 5 mm suction port, left paramedian 8 mm robotic port, left far lateral 8 mm robotic port.  Robot was  docked and passed the electronic checks.  Initial attention was directed to development of the space of Retzius.  An incision was made lateral to the right medial umbilical ligament from the midline towards the area of the internal ring coursing along  the iliac vessels towards the area of the right ureter, which was positively identified.  The right bladder wall was swept away from the pelvic sidewall towards the area of endopelvic fascia on the right side. A mirror image dissection was performed on  the left side.  The anterior attachment was taken down with cautery scissors.  This exposed the anterior base of the prostate, which was defatted to better denote the bladder neck junction.  The endopelvic fascia was then carefully swept away from the  apical aspect of the prostate just enough to visualize the dorsal venous complex. It was controlled using green load stapler, taking exquisite care to avoid membranous urethral injury.  Next, the true bladder neck was identified and an inverted U  cystotomy was made approximately 1 cm proximal to this, dropping the anterior 50% circumference of the bladder away from the very large prostate adenoma, which was easily visualized.  There  was significant intravesical protrusion of the lateral lobes,  but no median lobe per se.  Next, 0 Vicryl stay sutures were applied, 2 posteriorly, 2 anteriorly to allow  for manipulation of the adenoma.  Ureteral orifices were positively identified and posterior incision was made in the mucosa overlying the  posterior aspect of the adenoma.  This was approximately 2 cm distal to the ureteral orifices.  The adenoma plane was entered, carried laterally to the 4 o'clock and 8 o'clock positions and the posterior adenoma plane was developed towards the area of  the apex of the prostate.  This was then carried from the 4 o'clock to the 12 o'clock position on the right side, dissecting lateral to the right adenoma and involving the left side.  The anterior attachment was taken down from the prostatic capsule and  the entire adenoma was placed on superior traction.  The abdomen was then incised at the 12 o'clock position down to the area of the apex to allow better visualization of this and the final apical dissection was performed using a combination of medial  and lateral approach.  This completely released the large adenoma.  This was placed in an EndoCatch bag for later retrieval.  The prostatic fossa was carefully inspected.  Additional hemostasis was achieved with coagulation current.  Digital rectal exam  was performed using an indicator glove under laparoscopic vision and no evidence of rectal violation was noted. Mucosal advancement was performed using a 3-0 V-Loc double arm suture reapproximating the posterior bladder mucosa to the area of the  membranous urethra.  This was performed for approximately the posterior 50% circumference of the urethra, thus performing an excellent mucosal bridge from the mid urethra all the way to the bladder.  Hemostasis was quite good.  Ureteral orifices remained  visibly patent.  The cystotomy was then closed using 2 separate running suture lines of 2-0 V-Loc meeting in the 12 o'clock position. A new very tight Foley catheter was placed per urethra, which irrigated quantitatively, 30 mL of sterile water placed  in the balloon, it was placed  on gentle catheter strap traction.  Specimen was retrieved by extending the previous camera port site inferiorly for a total distance of approximately 4 cm, removing the large adenoma specimen, setting it aside for permanent  pathology.  This was closed at the level of the fascia using figure-of-eight PDS x3 followed by reapproximation of Scarpa's with running Vicryl.  All incision sites were infiltrated with dilute lipolyzed Marcaine and closed at the level of the skin  using subcuticular Monocryl and Dermabond.  Procedure was terminated.  The patient tolerated the procedure well.  No immediate complications.  The patient was taken to the Postanesthesia Care Unit in stable condition.  Plan for inpatient admission.  Please note first assistant, Debbrah Alar, was crucial for all portions of the surgery today.  She provided invaluable retraction, suctioning, specimen manipulation, vascular stapling and general first assistance.     PAA D: 05/09/2021 3:34:44 pm T: 05/10/2021 2:57:00 am  JOB: [id number]/ [id number]

## 2021-05-11 NOTE — Progress Notes (Signed)
Patient discharged home with family, discharge instructions, foley care home management and supplies given and explained to patient, he verbalized understanding, patient denies any pain/distress. Surgical site clean/dry/intact, no sign of infection. Accompanied home by nephew.

## 2021-05-11 NOTE — Discharge Summary (Signed)
Patient ID: John Spencer MRN: [id number] DOB/AGE: [birth year] 71 y.o.  Admit date: 05/09/2021 Discharge date: 05/11/2021  Primary Care Physician:  [facility] [place]  Discharge Diagnoses: BPH with obstruction Present on Admission:  BPH (benign prostatic hyperplasia)    Discharge Medications:    Significant Diagnostic Studies:  No results found.  Brief H and P: For complete details please refer to admission H and P, but in brief patient admitted for surgical management of BPH with huge prostate  Hospital Course:  Patient was admitted preoperatively/same day.  Underwent robotic simple prostatectomy.  Postoperative course was unremarkable.  He did stay 2 nights because of increased pain but this was managed appropriately.  He ambulated, took fluids fine and was discharged on postoperative day #2.  Day of Discharge BP 132/71 (BP Location: Right Arm)    Pulse 64    Temp 98.1 F (36.7 C) (Oral)    Resp 18    Ht 5\' 11"  (1.803 m)    Wt 75.4 kg    SpO2 95%    BMI 23.18 kg/m   No results found for this or any previous visit (from the past 24 hour(s)).  Physical Exam: General: Alert and awake oriented x3 not in any acute distress. HEENT: anicteric sclera, pupils reactive to light and accommodation CVS: S1-S2 clear no murmur rubs or gallops Chest: clear to auscultation bilaterally, no wheezing rales or rhonchi Abdomen: soft nontender, nondistended, normal bowel sounds, no organomegaly Extremities: no cyanosis, clubbing or edema noted bilaterally Neuro: Cranial nerves II-XII intact, no focal neurological deficits  Disposition: Home  Diet: Regular  Activity: Gradually increase   TESTS THAT NEED FOLLOW-UP  Pathology review  DISCHARGE FOLLOW-UP   Follow-up Information     Alexis Frock, MD Follow up on 05/20/2021.   Specialty: Urology Why: at 8:45 for MD visit, pathology review, and catheter removal. Contact information: [place]  [id number] [phone]                 Time spent on Discharge:  10 minutes  Signed: Lillette Boxer Jireh Elmore 05/11/2021, 7:45 AM

## 2021-05-11 NOTE — TOC CM/SW Note (Signed)
°  Transition of Care [facility]) Screening Note   Patient Details  Name: John Spencer Date of Birth: 1950/07/17   Transition of Care [facility]) CM/SW Contact:    Ross Ludwig, LCSW Phone Number: 05/11/2021, 10:13 AM    Transition of Care Department [facility]) has reviewed patient and no TOC needs have been identified at this time. We will continue to monitor patient advancement through interdisciplinary progression rounds. If new patient transition needs arise, please place a TOC consult.

## 2021-05-12 ENCOUNTER — Other Ambulatory Visit: Payer: Self-pay

## 2021-05-12 ENCOUNTER — Encounter (HOSPITAL_COMMUNITY): Payer: Self-pay | Admitting: Urology

## 2021-05-12 ENCOUNTER — Emergency Department (HOSPITAL_COMMUNITY): Payer: Medicare Other

## 2021-05-12 ENCOUNTER — Inpatient Hospital Stay (HOSPITAL_COMMUNITY)
Admission: EM | Admit: 2021-05-12 | Discharge: 2021-05-17 | DRG: 390 | Disposition: A | Discharge status: Home / Self Care | Payer: Medicare Other | Attending: Urology | Admitting: Urology

## 2021-05-12 DIAGNOSIS — E119 Type 2 diabetes mellitus without complications: Secondary | ICD-10-CM | POA: Diagnosis present

## 2021-05-12 DIAGNOSIS — Z800 Family history of malignant neoplasm of digestive organs: Secondary | ICD-10-CM | POA: Diagnosis not present

## 2021-05-12 DIAGNOSIS — N3289 Other specified disorders of bladder: Secondary | ICD-10-CM | POA: Diagnosis not present

## 2021-05-12 DIAGNOSIS — I252 Old myocardial infarction: Secondary | ICD-10-CM | POA: Diagnosis not present

## 2021-05-12 DIAGNOSIS — Z20822 Contact with and (suspected) exposure to covid-19: Secondary | ICD-10-CM | POA: Diagnosis present

## 2021-05-12 DIAGNOSIS — K567 Ileus, unspecified: Secondary | ICD-10-CM | POA: Diagnosis not present

## 2021-05-12 DIAGNOSIS — R531 Weakness: Secondary | ICD-10-CM

## 2021-05-12 DIAGNOSIS — N281 Cyst of kidney, acquired: Secondary | ICD-10-CM | POA: Diagnosis not present

## 2021-05-12 DIAGNOSIS — Y836 Removal of other organ (partial) (total) as the cause of abnormal reaction of the patient, or of later complication, without mention of misadventure at the time of the procedure: Secondary | ICD-10-CM | POA: Diagnosis present

## 2021-05-12 DIAGNOSIS — I2510 Atherosclerotic heart disease of native coronary artery without angina pectoris: Secondary | ICD-10-CM | POA: Diagnosis not present

## 2021-05-12 DIAGNOSIS — R110 Nausea: Secondary | ICD-10-CM | POA: Diagnosis not present

## 2021-05-12 DIAGNOSIS — Z8249 Family history of ischemic heart disease and other diseases of the circulatory system: Secondary | ICD-10-CM | POA: Diagnosis not present

## 2021-05-12 DIAGNOSIS — Z87891 Personal history of nicotine dependence: Secondary | ICD-10-CM

## 2021-05-12 DIAGNOSIS — R8271 Bacteriuria: Secondary | ICD-10-CM | POA: Diagnosis present

## 2021-05-12 DIAGNOSIS — Z955 Presence of coronary angioplasty implant and graft: Secondary | ICD-10-CM | POA: Diagnosis not present

## 2021-05-12 DIAGNOSIS — K6389 Other specified diseases of intestine: Secondary | ICD-10-CM | POA: Diagnosis not present

## 2021-05-12 DIAGNOSIS — Z743 Need for continuous supervision: Secondary | ICD-10-CM | POA: Diagnosis not present

## 2021-05-12 DIAGNOSIS — R6889 Other general symptoms and signs: Secondary | ICD-10-CM | POA: Diagnosis not present

## 2021-05-12 DIAGNOSIS — J9811 Atelectasis: Secondary | ICD-10-CM | POA: Diagnosis not present

## 2021-05-12 DIAGNOSIS — R059 Cough, unspecified: Secondary | ICD-10-CM | POA: Diagnosis not present

## 2021-05-12 DIAGNOSIS — K9189 Other postprocedural complications and disorders of digestive system: Secondary | ICD-10-CM | POA: Diagnosis present

## 2021-05-12 LAB — URINALYSIS, ROUTINE W REFLEX MICROSCOPIC
RBC / HPF: >50 RBC/hpf — ABNORMAL HIGH (ref 0–5)
WBC, UA: >50 WBC/hpf — ABNORMAL HIGH (ref 0–5)

## 2021-05-12 LAB — BASIC METABOLIC PANEL
Anion gap: 10 (ref 5–15)
BUN: 18 mg/dL (ref 8–23)
CO2: 23 mmol/L (ref 22–32)
Calcium: 8.7 mg/dL — ABNORMAL LOW (ref 8.9–10.3)
Chloride: 101 mmol/L (ref 98–111)
Creatinine, Ser: 0.93 mg/dL (ref 0.61–1.24)
GFR, Estimated: >60 mL/min (ref >60)
Glucose, Bld: 130 mg/dL — ABNORMAL HIGH (ref 70–99)
Potassium: 3.7 mmol/L (ref 3.5–5.1)
Sodium: 134 mmol/L — ABNORMAL LOW (ref 135–145)

## 2021-05-12 LAB — CBC WITH DIFFERENTIAL/PLATELET
Abs Immature Granulocytes: 0.04 10*3/uL (ref 0.00–0.07)
Basophils Absolute: 0 10*3/uL (ref 0.0–0.1)
Basophils Relative: 0 %
Eosinophils Absolute: 0 10*3/uL (ref 0.0–0.5)
Eosinophils Relative: 0 %
HCT: 41.7 % (ref 39.0–52.0)
Hemoglobin: 14.1 g/dL (ref 13.0–17.0)
Immature Granulocytes: 0 %
Lymphocytes Relative: 12 %
Lymphs Abs: 1.2 10*3/uL (ref 0.7–4.0)
MCH: 31.6 pg (ref 26.0–34.0)
MCHC: 33.8 g/dL (ref 30.0–36.0)
MCV: 93.5 fL (ref 80.0–100.0)
Monocytes Absolute: 0.7 10*3/uL (ref 0.1–1.0)
Monocytes Relative: 6 %
Neutro Abs: 8.3 10*3/uL — ABNORMAL HIGH (ref 1.7–7.7)
Neutrophils Relative %: 82 %
Platelets: 307 10*3/uL (ref 150–400)
RBC: 4.46 MIL/uL (ref 4.22–5.81)
RDW: 12.5 % (ref 11.5–15.5)
WBC: 10.3 10*3/uL (ref 4.0–10.5)
nRBC: 0 % (ref 0.0–0.2)

## 2021-05-12 LAB — RESP PANEL BY RT-PCR (FLU A&B, COVID) ARPGX2
Influenza A by PCR: NEGATIVE
Influenza B by PCR: NEGATIVE
SARS Coronavirus 2 by RT PCR: NEGATIVE

## 2021-05-12 IMAGING — DX DG CHEST 1V PORT
1 series · 1 of 1 positions shown · non-contrast
Comparison: None.

CLINICAL DATA: Cough

EXAM:
PORTABLE CHEST 1 VIEW

[chest ap]
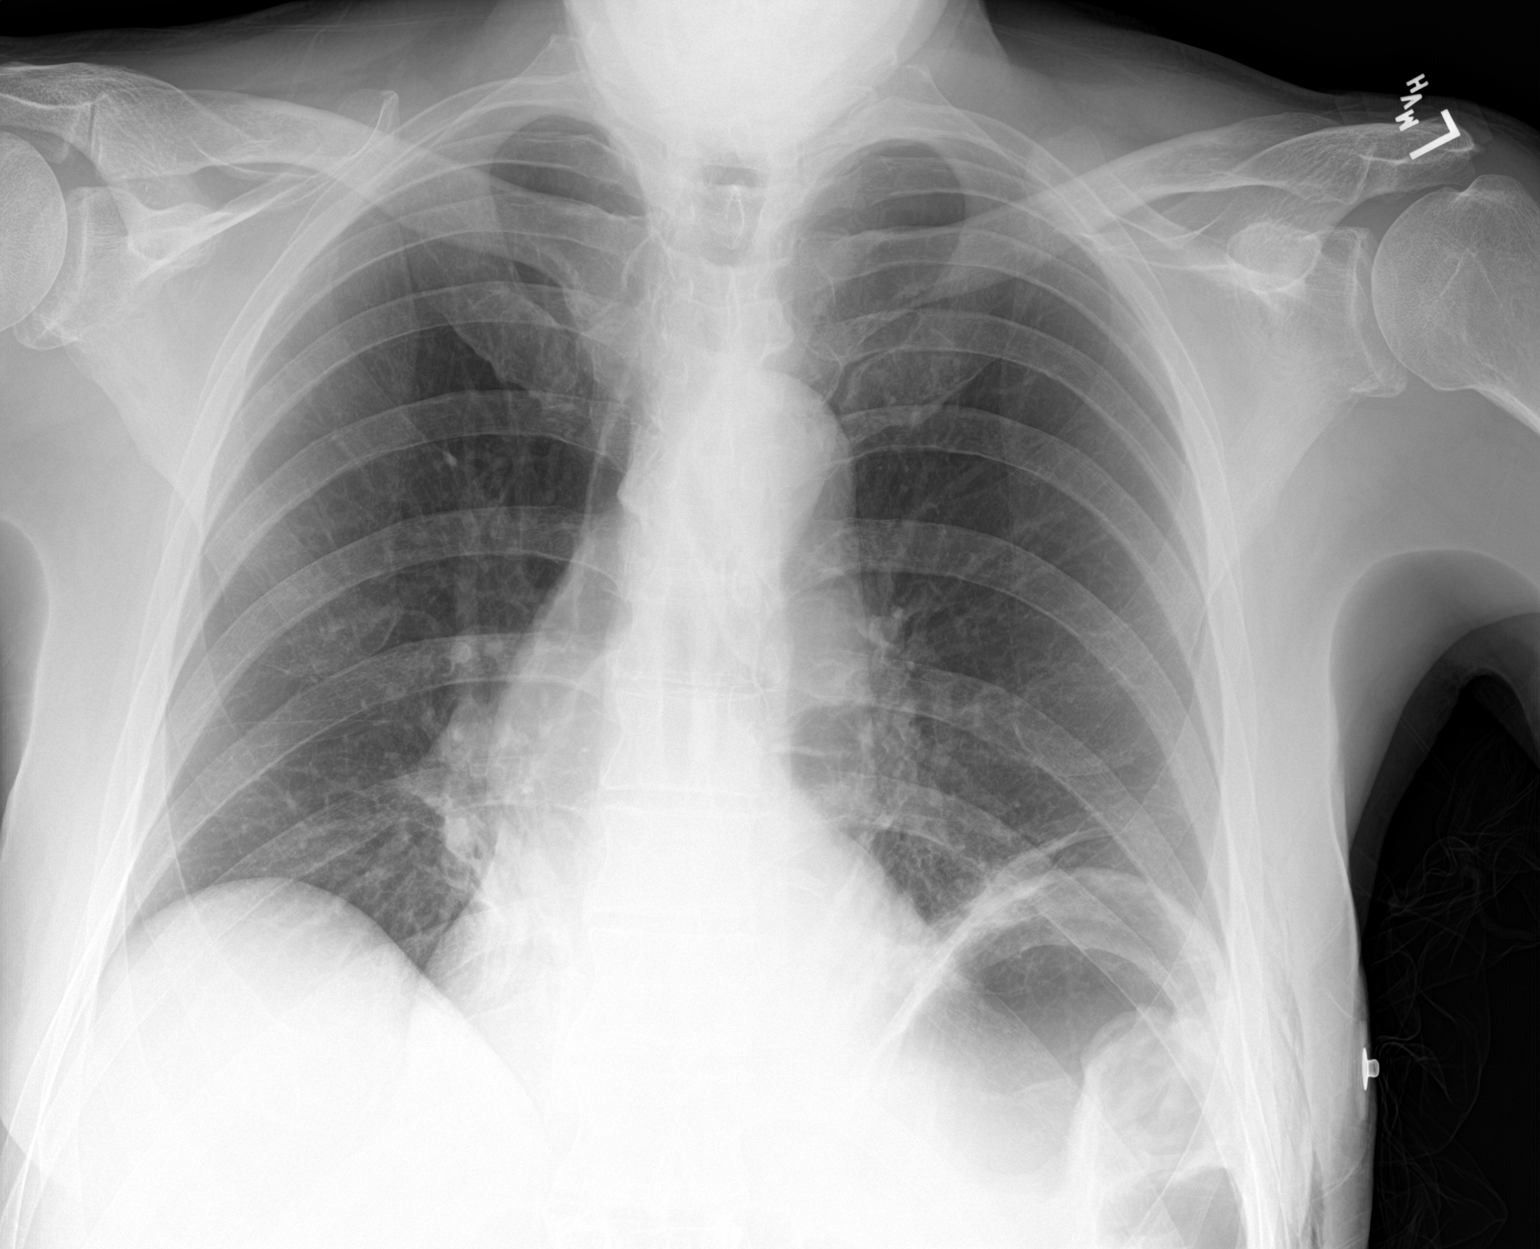

[1 of 1 positions shown; findings below may reference images not displayed]

FINDINGS: Linear atelectasis at the left base. Right lung clear. Heart is
normal size. No effusions or acute bony abnormality.
IMPRESSION: Left base atelectasis.  No active disease.

## 2021-05-12 IMAGING — CT CT ABD-PELV W/O CM
2 of 4 series · 14 of 46 positions shown, 16 images · non-contrast
Comparison: [DATE]

CLINICAL DATA: Generalized weakness, nausea, prostatectomy 4 days
ago

EXAM:
CT ABDOMEN AND PELVIS WITHOUT CONTRAST
TECHNIQUE: Multidetector CT imaging of the abdomen and pelvis was performed
following the standard protocol without IV contrast. Unenhanced CT
was performed per clinician order. Lack of IV contrast limits
sensitivity and specificity, especially for evaluation of
abdominal/pelvic solid viscera.
RADIATION DOSE REDUCTION: This exam was performed according to the
departmental dose-optimization program which includes automated
exposure control, adjustment of the mA and/or kV according to
patient size and/or use of iterative reconstruction technique.

[Series 2: axial st · axial · 0.83mm/px · z∈[-679,-189]mm · 11 of 110 slices shown, 13 images]
[im 6/110  soft-tissue]
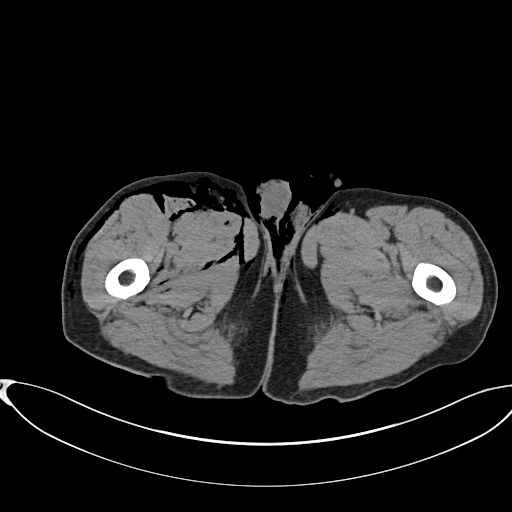
[im 6/110  bone]
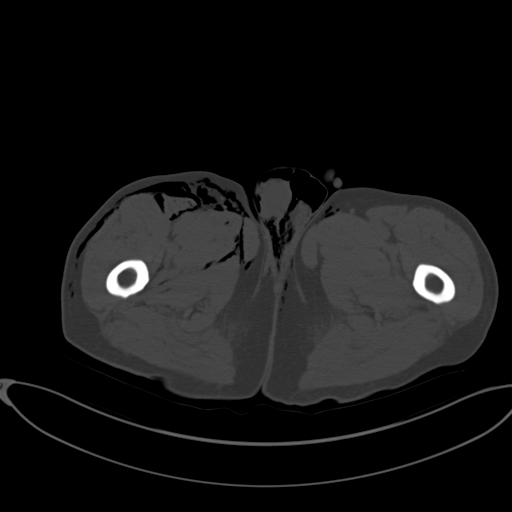
[im 18/110  soft-tissue]
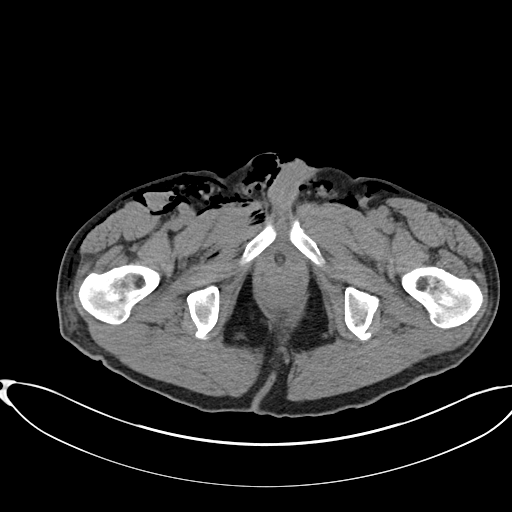
[im 29/110  soft-tissue]
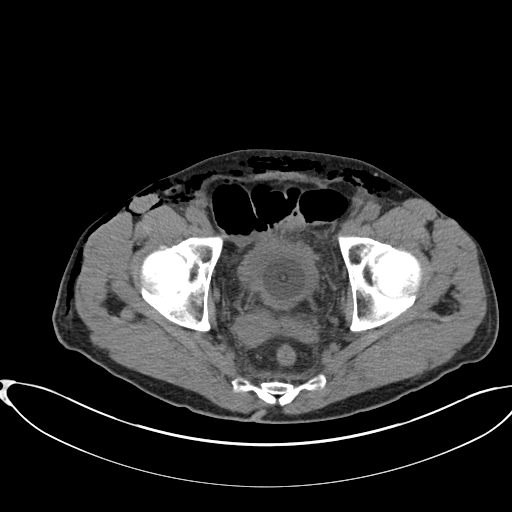
[im 35/110  soft-tissue]
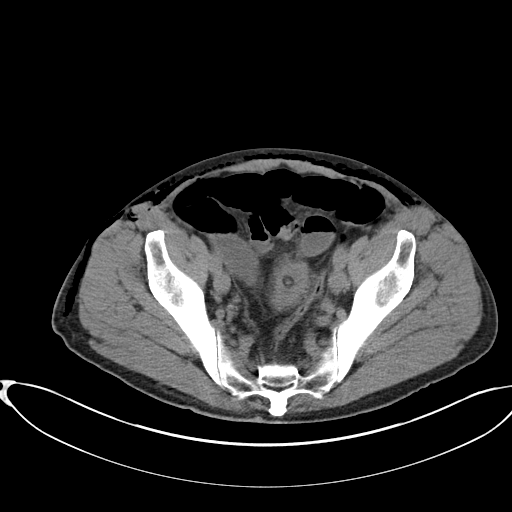
[im 46/110  soft-tissue]
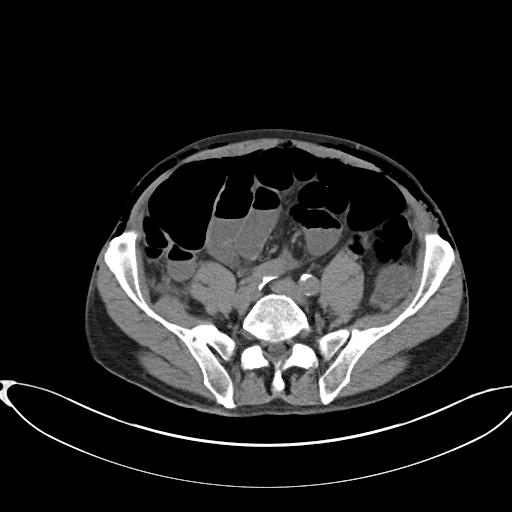
[im 58/110  soft-tissue]
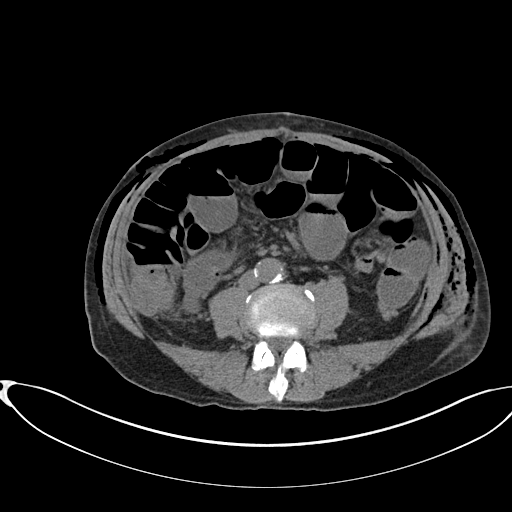
[im 64/110  soft-tissue]
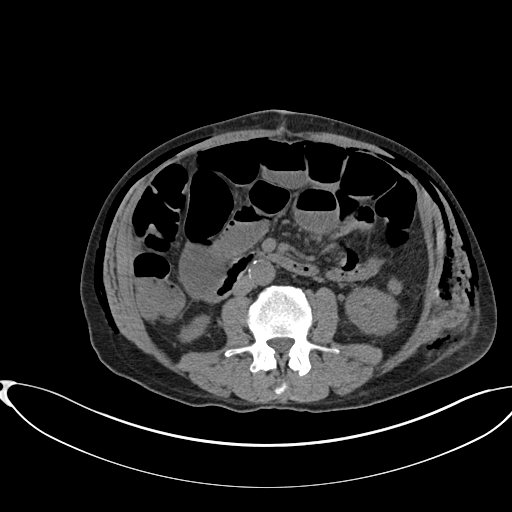
[im 75/110  soft-tissue]
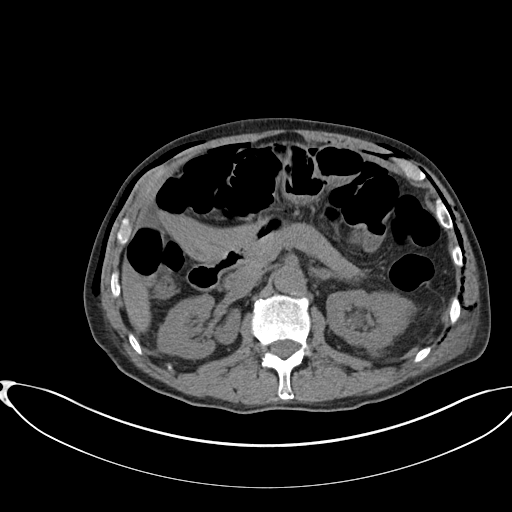
[im 81/110  soft-tissue]
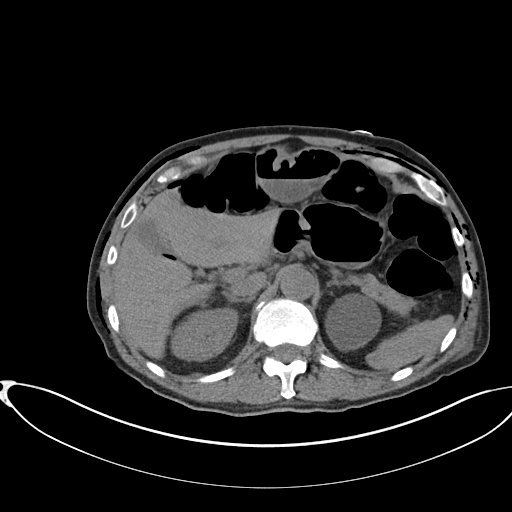
[im 81/110  bone]
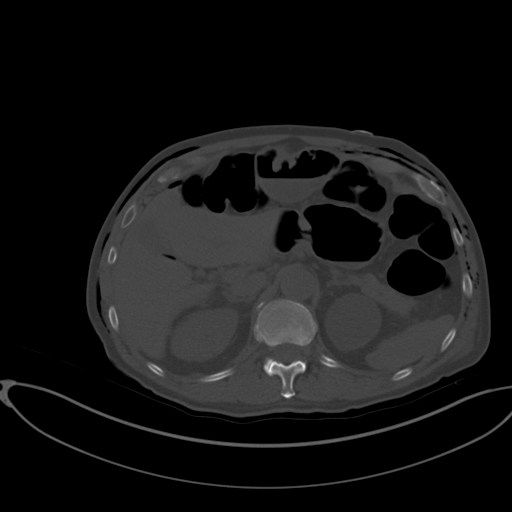
[im 92/110  soft-tissue]
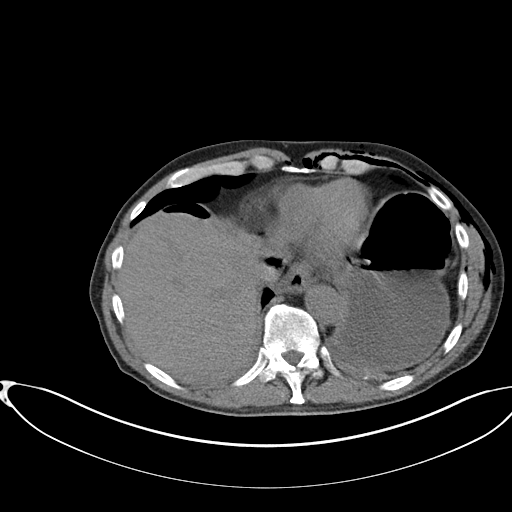
[im 104/110  soft-tissue]
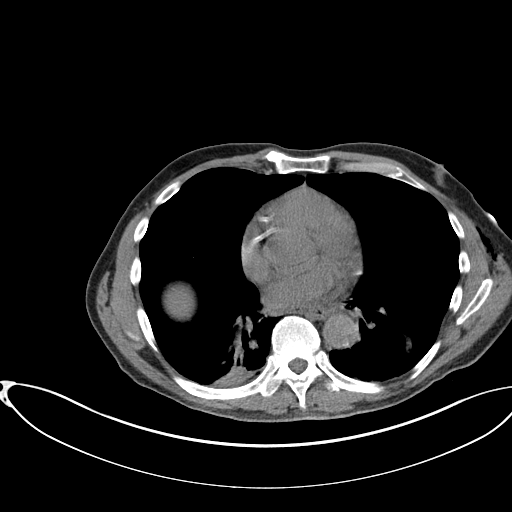

[Series 5: coronal st · coronal · 0.73mm/px · 3 of 142 slices shown]
[im 48/142  soft-tissue]
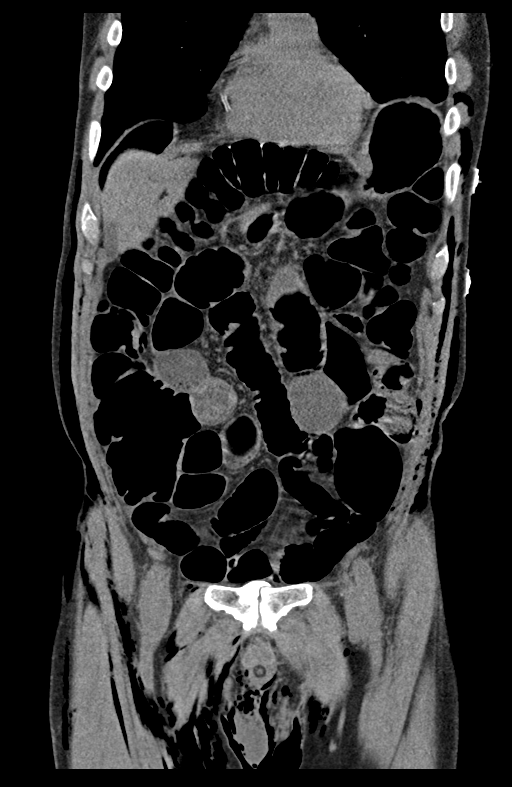
[im 63/142  soft-tissue]
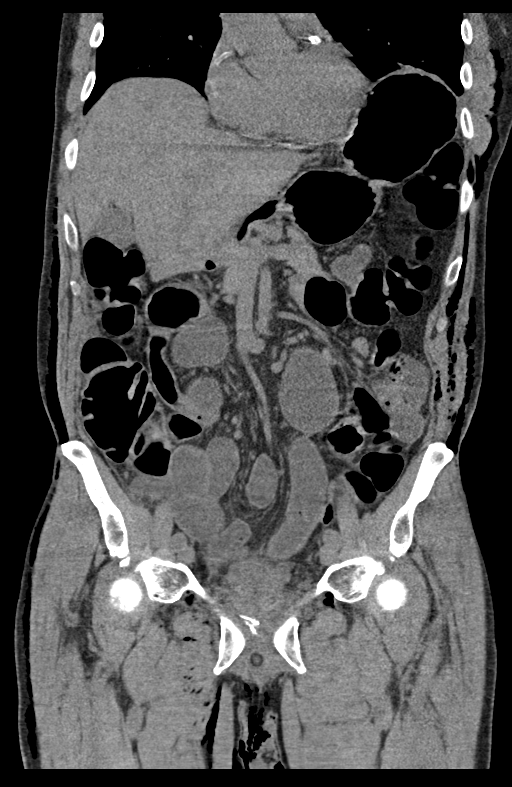
[im 79/142  soft-tissue]
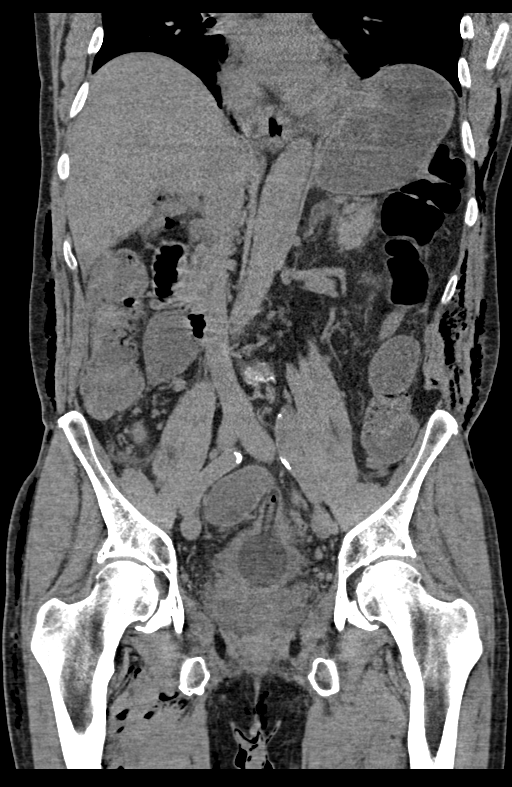

[14 of 46 positions shown; findings below may reference images not displayed]

FINDINGS: Lower chest: Hypoventilatory changes are seen at the lung bases. No
effusion or pneumothorax.

Hepatobiliary: Unremarkable unenhanced appearance of the liver and
gallbladder.

Pancreas: Unremarkable unenhanced appearance.

Spleen: Unremarkable unenhanced appearance.

Adrenals/Urinary Tract: Stable left renal cysts. No urinary tract
calculi or obstructive uropathy within either kidney. Adrenals are
unremarkable. Bladder decompressed with a Foley catheter.

Stomach/Bowel: There is mild diffuse dilation of the small bowel
measuring up to 3.3 cm in diameter. There is also gaseous distension
of the ascending and transverse colon. Numerous gas fluid levels.
Findings are likely related to postoperative ileus. No bowel wall
thickening or inflammatory change.

Vascular/Lymphatic: Aortic atherosclerosis. No enlarged abdominal or
pelvic lymph nodes.

Reproductive: Postsurgical changes are seen from partial
prostatectomy. Marked debulking the prostate after recent surgery.
Foley catheter traverses the prostatic urethra. Seminal vesicles are
unremarkable.

Other: There is extensive subcutaneous gas within the right thigh,
perineum, anterior abdominal wall consistent with recent
laparoscopic surgery. A small amount of residual pneumoperitoneum is
seen within the ventral abdomen, consistent with recent laparoscopy.

No free fluid.  No abdominal wall hernia.

Musculoskeletal: There are no acute or destructive bony lesions.
Reconstructed images demonstrate no additional findings.
IMPRESSION: 1. Large amount of subcutaneous gas as well as a small amount of
residual pneumoperitoneum, consistent with recent surgical
intervention and laparoscopic prostatectomy.
2. Diffuse distension of the small bowel and colon, with multiple
gas fluid levels, most compatible with postoperative ileus.
3. Marked debulking of the prostate, with Foley catheter traversing
the prostatic urethra and decompressing the urinary bladder.
4.  Aortic Atherosclerosis ([TJ]-[TJ]).

## 2021-05-12 MED ORDER — SODIUM CHLORIDE 0.9 % IV SOLN: INTRAVENOUS | Status: DC

## 2021-05-12 MED ORDER — FINASTERIDE 5 MG PO TABS
5.0000 mg | ORAL_TABLET | Freq: Every day | ORAL | Status: DC
  Administered 2021-05-13 – 2021-05-16 (×4): 5 mg via ORAL
  Filled 2021-05-12 (×4): qty 1

## 2021-05-12 MED ORDER — LACTATED RINGERS IV BOLUS
1000.0000 mL | Freq: Once | INTRAVENOUS | Status: AC
  Administered 2021-05-12: 1000 mL via INTRAVENOUS

## 2021-05-12 MED ORDER — ONDANSETRON HCL 4 MG/2ML IJ SOLN
4.0000 mg | Freq: Four times a day (QID) | INTRAMUSCULAR | Status: DC | PRN
  Administered 2021-05-13: 4 mg via INTRAVENOUS
  Filled 2021-05-12: qty 2

## 2021-05-12 MED ORDER — ONDANSETRON HCL 4 MG/2ML IJ SOLN
4.0000 mg | Freq: Once | INTRAMUSCULAR | Status: AC
  Administered 2021-05-12: 4 mg via INTRAVENOUS
  Filled 2021-05-12: qty 2

## 2021-05-12 MED ORDER — ONDANSETRON HCL 4 MG PO TABS: 4.0000 mg | ORAL_TABLET | Freq: Four times a day (QID) | ORAL | Status: DC | PRN

## 2021-05-12 MED ORDER — PANTOPRAZOLE SODIUM 40 MG IV SOLR
40.0000 mg | Freq: Every day | INTRAVENOUS | Status: DC
  Administered 2021-05-12 – 2021-05-14 (×3): 40 mg via INTRAVENOUS
  Filled 2021-05-12 (×3): qty 10

## 2021-05-12 MED ORDER — BISACODYL 10 MG RE SUPP: 10.0000 mg | Freq: Once | RECTAL | Status: DC

## 2021-05-12 MED ORDER — DOCUSATE SODIUM 100 MG PO CAPS
100.0000 mg | ORAL_CAPSULE | Freq: Two times a day (BID) | ORAL | Status: DC
  Administered 2021-05-13 – 2021-05-15 (×4): 100 mg via ORAL
  Filled 2021-05-12 (×5): qty 1

## 2021-05-12 MED ORDER — LACTATED RINGERS IV SOLN: INTRAVENOUS | Status: DC

## 2021-05-12 MED ORDER — ACETAMINOPHEN 325 MG PO TABS: 650.0000 mg | ORAL_TABLET | ORAL | Status: DC | PRN

## 2021-05-12 MED ORDER — SODIUM CHLORIDE 0.9 % IV SOLN
1.0000 g | INTRAVENOUS | Status: DC
  Administered 2021-05-12: 1 g via INTRAVENOUS
  Filled 2021-05-12: qty 10

## 2021-05-12 NOTE — ED Triage Notes (Addendum)
Per EMS- Patient is from home. Patient had a prostatectomy on 04/08/21 and is now c/o generalized weakness and nausea. Patient arrived with a foley catheter

## 2021-05-12 NOTE — H&P (Signed)
H&P  Chief Complaint: P.o. intolerance at home  History of Present Illness: John Spencer is a 71 y.o. year old with a history of BPH, coronary artery disease, diabetes, depression who is status post robotic simple prostatectomy with Dr. Bess Harvest on 05/09/2021.  Discharged home on 05/11/2021 with a relatively uncomplicated hospital course.  Presents to the ER today endorsing weakness, nausea, intolerance of p.o. intake.  He endorses that he is continue to drain well from difficult Foley catheter.  He denies fevers, chills, shortness of breath, chest pain.  He states he has continued to pass gas.  He has not been able to eat.  Has not had a bowel movement.  Review of systems otherwise negative.  Past Medical History:  Diagnosis Date   At average risk for colon cancer    BPH (benign prostatic hyperplasia)    Cancer (HCC)    Coronary artery disease    Prior PCI x4, per patient.   Depression    Diabetes mellitus without complication ([place])    Heart attack ([place])    Internal carotid artery stenosis, left    Pneumonia     Past Surgical History:  Procedure Laterality Date   APPENDECTOMY     XI ROBOTIC ASSISTED SIMPLE PROSTATECTOMY N/A 05/09/2021   Procedure: XI ROBOTIC ASSISTED SIMPLE PROSTATECTOMY;  Surgeon: Alexis Frock, MD;  Location: [facility];  Service: Urology;  Laterality: N/A;    Home Medications:  No current facility-administered medications on file prior to encounter.   Current Outpatient Medications on File Prior to Encounter  Medication Sig Dispense Refill   docusate sodium (COLACE) 100 MG capsule Take 1 capsule (100 mg total) by mouth 2 (two) times daily.     finasteride (PROSCAR) 5 MG tablet Take 5 mg by mouth daily.     HYDROcodone-acetaminophen (NORCO) 5-325 MG tablet Take 1-2 tablets by mouth every 6 (six) hours as needed for moderate pain or severe pain. 20 tablet 0   oxyCODONE (OXY IR/ROXICODONE) 5 MG immediate release tablet Take 5 mg by mouth every 6 (six) hours as needed.      Polyethylene Glycol 3350 (MIRALAX PO) Take 1 Dose by mouth daily as needed (constipation). 1 dose = 1-2 teaspoons     rosuvastatin (CRESTOR) 20 MG tablet TAKE 1 TABLET BY MOUTH EVERYDAY AT BEDTIME (Patient not taking: Reported on 04/16/2021) 90 tablet 0   sulfamethoxazole-trimethoprim (BACTRIM DS) 800-160 MG tablet Take 1 tablet by mouth 2 (two) times daily. Start the day prior to foley removal appointment 6 tablet 0     Allergies: No Known Allergies  Family History  Problem Relation Age of Onset   Heart disease Mother    Hypertension Mother    Heart disease Father    Hypertension Father    Colon cancer Sister    Hypertension Brother     Social History:  reports that he quit smoking about 31 years ago. His smoking use included cigarettes. He has a 3.00 pack-year smoking history. He has never used smokeless tobacco. He reports that he does not currently use drugs after having used the following drugs: Marijuana. He reports that he does not drink alcohol.  ROS: A complete review of systems was performed.  All systems are negative except for pertinent findings as noted.  Physical Exam:  Vital signs in last 24 hours: Temp:  [98.5 F (36.9 C)] 98.5 F (36.9 C) (02/13 1739) Pulse Rate:  [63-79] 75 (02/13 2100) Resp:  [16] 16 (02/13 2100) BP: (148-176)/(60-81) 176/78 (02/13 2100) SpO2:  [  93 %-98 %] 93 % (02/13 2100) Weight:  [78.5 kg] 78.5 kg (02/13 1739) Constitutional:  Alert and oriented, No acute distress Cardiovascular: Regular rate and rhythm, No JVD Respiratory: Normal respiratory effort, Lungs clear bilaterally GI: Abdomen is soft, nontender, abdomen is distended and tympanic to percussion GU: No CVA tenderness Foley in place draining amber-colored urine Lymphatic: No lymphadenopathy Neurologic: Grossly intact, no focal deficits Psychiatric: Normal mood and affect   Laboratory Data:  Recent Labs    05/10/21 0450 05/12/21 1807  WBC 11.2* 10.3  HGB 12.5* 14.1  HCT  38.5* 41.7  PLT 250 307    Recent Labs    05/10/21 0450 05/12/21 1807  NA 134* 134*  K 4.8 3.7  CL 103 101  GLUCOSE 125* 130*  BUN 22 18  CALCIUM 8.1* 8.7*  CREATININE 1.51* 0.93     Results for orders placed or performed during the hospital encounter of 05/12/21 (from the past 24 hour(s))  Urinalysis, Routine w reflex microscopic Urine, Catheterized     Status: Abnormal   Collection Time: 05/12/21  5:58 PM  Result Value Ref Range   Color, Urine RED (A) YELLOW   APPearance TURBID (A) CLEAR   Specific Gravity, Urine  1.005 - 1.030    TEST NOT REPORTED DUE TO COLOR INTERFERENCE OF URINE PIGMENT   pH  5.0 - 8.0    TEST NOT REPORTED DUE TO COLOR INTERFERENCE OF URINE PIGMENT   Glucose, UA (A) NEGATIVE mg/dL    TEST NOT REPORTED DUE TO COLOR INTERFERENCE OF URINE PIGMENT   Hgb urine dipstick (A) NEGATIVE    TEST NOT REPORTED DUE TO COLOR INTERFERENCE OF URINE PIGMENT   Bilirubin Urine (A) NEGATIVE    TEST NOT REPORTED DUE TO COLOR INTERFERENCE OF URINE PIGMENT   Ketones, ur (A) NEGATIVE mg/dL    TEST NOT REPORTED DUE TO COLOR INTERFERENCE OF URINE PIGMENT   Protein, ur (A) NEGATIVE mg/dL    TEST NOT REPORTED DUE TO COLOR INTERFERENCE OF URINE PIGMENT   Nitrite (A) NEGATIVE    TEST NOT REPORTED DUE TO COLOR INTERFERENCE OF URINE PIGMENT   Leukocytes,Ua (A) NEGATIVE    TEST NOT REPORTED DUE TO COLOR INTERFERENCE OF URINE PIGMENT   RBC / HPF >50 (H) 0 - 5 RBC/hpf   WBC, UA >50 (H) 0 - 5 WBC/hpf   Bacteria, UA MANY (A) NONE SEEN  CBC with Differential     Status: Abnormal   Collection Time: 05/12/21  6:07 PM  Result Value Ref Range   WBC 10.3 4.0 - 10.5 K/uL   RBC 4.46 4.22 - 5.81 MIL/uL   Hemoglobin 14.1 13.0 - 17.0 g/dL   HCT 41.7 39.0 - 52.0 %   MCV 93.5 80.0 - 100.0 fL   MCH 31.6 26.0 - 34.0 pg   MCHC 33.8 30.0 - 36.0 g/dL   RDW 12.5 11.5 - 15.5 %   Platelets 307 150 - 400 K/uL   nRBC 0.0 0.0 - 0.2 %   Neutrophils Relative % 82 %   Neutro Abs 8.3 (H) 1.7 - 7.7  K/uL   Lymphocytes Relative 12 %   Lymphs Abs 1.2 0.7 - 4.0 K/uL   Monocytes Relative 6 %   Monocytes Absolute 0.7 0.1 - 1.0 K/uL   Eosinophils Relative 0 %   Eosinophils Absolute 0.0 0.0 - 0.5 K/uL   Basophils Relative 0 %   Basophils Absolute 0.0 0.0 - 0.1 K/uL   Immature Granulocytes 0 %  Abs Immature Granulocytes 0.04 0.00 - 0.07 K/uL  Basic metabolic panel     Status: Abnormal   Collection Time: 05/12/21  6:07 PM  Result Value Ref Range   Sodium 134 (L) 135 - 145 mmol/L   Potassium 3.7 3.5 - 5.1 mmol/L   Chloride 101 98 - 111 mmol/L   CO2 23 22 - 32 mmol/L   Glucose, Bld 130 (H) 70 - 99 mg/dL   BUN 18 8 - 23 mg/dL   Creatinine, Ser 0.93 0.61 - 1.24 mg/dL   Calcium 8.7 (L) 8.9 - 10.3 mg/dL   GFR, Estimated >60 >60 mL/min   Anion gap 10 5 - 15  Resp Panel by RT-PCR (Flu A&B, Covid) Nasopharyngeal Swab     Status: None   Collection Time: 05/12/21  7:26 PM   Specimen: Nasopharyngeal Swab; Nasopharyngeal(NP) swabs in vial transport medium  Result Value Ref Range   SARS Coronavirus 2 by RT PCR NEGATIVE NEGATIVE   Influenza A by PCR NEGATIVE NEGATIVE   Influenza B by PCR NEGATIVE NEGATIVE   Recent Results (from the past 240 hour(s))  SARS CORONAVIRUS 2 (TAT 6-24 HRS) Nasopharyngeal Nasopharyngeal Swab     Status: None   Collection Time: 05/09/21 10:24 AM   Specimen: Nasopharyngeal Swab  Result Value Ref Range Status   SARS Coronavirus 2 NEGATIVE NEGATIVE Final    Comment: (NOTE) SARS-CoV-2 target nucleic acids are NOT DETECTED.  The SARS-CoV-2 RNA is generally detectable in upper and lower respiratory specimens during the acute phase of infection. Negative results do not preclude SARS-CoV-2 infection, do not rule out co-infections with other pathogens, and should not be used as the sole basis for treatment or other patient management decisions. Negative results must be combined with clinical observations, patient history, and epidemiological information. The  expected result is Negative.  Fact Sheet for Patients: SugarRoll.be  Fact Sheet for Healthcare Providers: https://www.woods-mathews.com/  This test is not yet approved or cleared by the [place] FDA and  has been authorized for detection and/or diagnosis of SARS-CoV-2 by FDA under an Emergency Use Authorization (EUA). This EUA will remain  in effect (meaning this test can be used) for the duration of the COVID-19 declaration under Se ction 564(b)(1) of the Act, 21 U.S.C. section 360bbb-3(b)(1), unless the authorization is terminated or revoked sooner.  Performed at [facility], [place] [address] [place], [place] [address]   SARS Coronavirus 2 by RT PCR (hospital order, performed in [facility] hospital lab) Nasopharyngeal Nasopharyngeal Swab     Status: None   Collection Time: 05/09/21 11:52 AM   Specimen: Nasopharyngeal Swab  Result Value Ref Range Status   SARS Coronavirus 2 NEGATIVE NEGATIVE Final    Comment: (NOTE) SARS-CoV-2 target nucleic acids are NOT DETECTED.  The SARS-CoV-2 RNA is generally detectable in upper and lower respiratory specimens during the acute phase of infection. The lowest concentration of SARS-CoV-2 viral copies this assay can detect is 250 copies / mL. A negative result does not preclude SARS-CoV-2 infection and should not be used as the sole basis for treatment or other patient management decisions.  A negative result may occur with improper specimen collection / handling, submission of specimen other than nasopharyngeal swab, presence of viral mutation(s) within the areas targeted by this assay, and inadequate number of viral copies (<250 copies / mL). A negative result must be combined with clinical observations, patient history, and epidemiological information.  Fact Sheet for Patients:   StrictlyIdeas.no  Fact Sheet for  Healthcare  Providers: BankingDealers.co.za  This test is not yet approved or  cleared by the [place] and has been authorized for detection and/or diagnosis of SARS-CoV-2 by FDA under an Emergency Use Authorization (EUA).  This EUA will remain in effect (meaning this test can be used) for the duration of the COVID-19 declaration under Section 564(b)(1) of the Act, 21 U.S.C. section 360bbb-3(b)(1), unless the authorization is terminated or revoked sooner.  Performed at [facility], [place] [address], [place], [place] [address]   Resp Panel by RT-PCR (Flu A&B, Covid) Nasopharyngeal Swab     Status: None   Collection Time: 05/12/21  7:26 PM   Specimen: Nasopharyngeal Swab; Nasopharyngeal(NP) swabs in vial transport medium  Result Value Ref Range Status   SARS Coronavirus 2 by RT PCR NEGATIVE NEGATIVE Final    Comment: (NOTE) SARS-CoV-2 target nucleic acids are NOT DETECTED.  The SARS-CoV-2 RNA is generally detectable in upper respiratory specimens during the acute phase of infection. The lowest concentration of SARS-CoV-2 viral copies this assay can detect is 138 copies/mL. A negative result does not preclude SARS-Cov-2 infection and should not be used as the sole basis for treatment or other patient management decisions. A negative result may occur with  improper specimen collection/handling, submission of specimen other than nasopharyngeal swab, presence of viral mutation(s) within the areas targeted by this assay, and inadequate number of viral copies(<138 copies/mL). A negative result must be combined with clinical observations, patient history, and epidemiological information. The expected result is Negative.  Fact Sheet for Patients:  EntrepreneurPulse.com.au  Fact Sheet for Healthcare Providers:  IncredibleEmployment.be  This test is no t yet approved or cleared by the [place] FDA and  has been  authorized for detection and/or diagnosis of SARS-CoV-2 by FDA under an Emergency Use Authorization (EUA). This EUA will remain  in effect (meaning this test can be used) for the duration of the COVID-19 declaration under Section 564(b)(1) of the Act, 21 U.S.C.section 360bbb-3(b)(1), unless the authorization is terminated  or revoked sooner.       Influenza A by PCR NEGATIVE NEGATIVE Final   Influenza B by PCR NEGATIVE NEGATIVE Final    Comment: (NOTE) The Xpert Xpress SARS-CoV-2/FLU/RSV plus assay is intended as an aid in the diagnosis of influenza from Nasopharyngeal swab specimens and should not be used as a sole basis for treatment. Nasal washings and aspirates are unacceptable for Xpert Xpress SARS-CoV-2/FLU/RSV testing.  Fact Sheet for Patients: EntrepreneurPulse.com.au  Fact Sheet for Healthcare Providers: IncredibleEmployment.be  This test is not yet approved or cleared by the [place] FDA and has been authorized for detection and/or diagnosis of SARS-CoV-2 by FDA under an Emergency Use Authorization (EUA). This EUA will remain in effect (meaning this test can be used) for the duration of the COVID-19 declaration under Section 564(b)(1) of the Act, 21 U.S.C. section 360bbb-3(b)(1), unless the authorization is terminated or revoked.  Performed at [facility], [place] [address], [place],  [address]     Renal Function: Recent Labs    05/09/21 1623 05/10/21 0450 05/12/21 1807  CREATININE 1.34* 1.51* 0.93   Estimated Creatinine Clearance: 78.8 mL/min (by C-G formula based on SCr of 0.93 mg/dL).  Radiologic Imaging: CT Abdomen Pelvis Wo Contrast  Result Date: 05/12/2021 CLINICAL DATA:  Generalized weakness, nausea, prostatectomy 4 days ago EXAM: CT ABDOMEN AND PELVIS WITHOUT CONTRAST TECHNIQUE: Multidetector CT imaging of the abdomen and pelvis was performed following the standard protocol without IV  contrast. Unenhanced CT was performed per clinician order. Lack of IV contrast limits sensitivity and specificity, especially for evaluation of abdominal/pelvic solid viscera. RADIATION DOSE REDUCTION: This exam was performed according to the departmental dose-optimization program which includes automated exposure control, adjustment of the mA and/or kV according to patient size and/or use of iterative reconstruction technique. COMPARISON:  06/16/2019 FINDINGS: Lower chest: Hypoventilatory changes are seen at the lung bases. No effusion or pneumothorax. Hepatobiliary: Unremarkable unenhanced appearance of the liver and gallbladder. Pancreas: Unremarkable unenhanced appearance. Spleen: Unremarkable unenhanced appearance. Adrenals/Urinary Tract: Stable left renal cysts. No urinary tract calculi or obstructive uropathy within either kidney. Adrenals are unremarkable. Bladder decompressed with a Foley catheter. Stomach/Bowel: There is mild diffuse dilation of the small bowel measuring up to 3.3 cm in diameter. There is also gaseous distension of the ascending and transverse colon. Numerous gas fluid levels. Findings are likely related to postoperative ileus. No bowel wall thickening or inflammatory change. Vascular/Lymphatic: Aortic atherosclerosis. No enlarged abdominal or pelvic lymph nodes. Reproductive: Postsurgical changes are seen from partial prostatectomy. Marked debulking the prostate after recent surgery. Foley catheter traverses the prostatic urethra. Seminal vesicles are unremarkable. Other: There is extensive subcutaneous gas within the right thigh, perineum, anterior abdominal wall consistent with recent laparoscopic surgery. A small amount of residual pneumoperitoneum is seen within the ventral abdomen, consistent with recent laparoscopy. No free fluid.  No abdominal wall hernia. Musculoskeletal: There are no acute or destructive bony lesions. Reconstructed images demonstrate no additional findings.  IMPRESSION: 1. Large amount of subcutaneous gas as well as a small amount of residual pneumoperitoneum, consistent with recent surgical intervention and laparoscopic prostatectomy. 2. Diffuse distension of the small bowel and colon, with multiple gas fluid levels, most compatible with postoperative ileus. 3. Marked debulking of the prostate, with Foley catheter traversing the prostatic urethra and decompressing the urinary bladder. 4.  Aortic Atherosclerosis (ICD10-I70.0). Electronically Signed   By: Randa Ngo M.D.   On: 05/12/2021 20:30   DG Chest Port 1 View  Result Date: 05/12/2021 CLINICAL DATA:  Cough EXAM: PORTABLE CHEST 1 VIEW COMPARISON:  None. FINDINGS: Linear atelectasis at the left base. Right lung clear. Heart is normal size. No effusions or acute bony abnormality. IMPRESSION: Left base atelectasis.  No active disease. Electronically Signed   By: Rolm Baptise M.D.   On: 05/12/2021 19:41    Impression/Assessment:  71 year old male who is status post robotic simple prostatectomy with Dr. Tresa Moore on 05/09/2021 presenting to the ER with intolerance of p.o. intake, abdominal distention.  Vital signs are stable.  Laboratory work-up is relatively within normal limits.  CT scan was obtained revealing a postoperative ileus as he had significant gas in the large and small bowel with gas fluid levels.  patient continues to pass gas.  We will plan to manage his ileus conservatively by admitting him, but given IV fluids, as needed antiemetics, keep me on a clear liquid diet.  We will continue to monitor him for progression.  We will give him a Dulcolax suppository in the morning.  If patient does not tolerate p.o. intake, distention worsens, begins vomiting, will consider nasogastric tube for decompression.  Plan:  Admit to urology - Home meds - Antiemetics - Normal saline at 75 cc/h - Clear liquid diet - Ceftriaxone for now while urine culture is pending  Shermaine Rivet [place]ha 05/12/2021, 9:20 PM   Pryor Curia. MD

## 2021-05-12 NOTE — ED Provider Triage Note (Signed)
Emergency Medicine Provider Triage Evaluation Note  Elam Ellis , a 71 y.o. male  was evaluated in triage.  Pt complains of generalized weakness and nausea since his prostatectomy that happened on Friday.  Patient was discharged yesterday.  He has been unable to eat as he feels like he is gagging and throws up.  Patient has catheter in place no fever or chills.  No diarrhea.  Review of Systems  Positive:  Negative: See above   Physical Exam  BP (!) 148/60    Pulse 63    Temp 98.5 F (36.9 C) (Oral)    Resp 16    Ht 5' 11.5" (1.816 m)    Wt 78.5 kg    SpO2 98% Comment: Simultaneous filing. User may not have seen previous data.   BMI 23.79 kg/m  Gen:   Awake, no distress   Resp:  Normal effort  MSK:   Moves extremities without difficulty  Other:  Healing incision over the abdomen   Medical Decision Making  Medically screening exam initiated at 5:58 PM.  Appropriate orders placed.  Yug Loria was informed that the remainder of the evaluation will be completed by another provider, this initial triage assessment does not replace that evaluation, and the importance of remaining in the ED until their evaluation is complete.    Myna Bright [place], [place] 05/12/21 1759

## 2021-05-12 NOTE — Anesthesia Postprocedure Evaluation (Signed)
Anesthesia Post Note  Patient: John Spencer  Procedure(s) Performed: XI ROBOTIC ASSISTED SIMPLE PROSTATECTOMY (Abdomen)     Patient location during evaluation: PACU Anesthesia Type: General Level of consciousness: awake and alert Pain management: pain level controlled Vital Signs Assessment: post-procedure vital signs reviewed and stable Respiratory status: spontaneous breathing, nonlabored ventilation, respiratory function stable and patient connected to nasal cannula oxygen Cardiovascular status: blood pressure returned to baseline and stable Postop Assessment: no apparent nausea or vomiting Anesthetic complications: no   No notable events documented.  Last Vitals:  Vitals:   05/10/21 2024 05/11/21 0318  BP: (!) 126/57 132/71  Pulse: 61 64  Resp: 18 18  Temp: 36.7 C 36.7 C  SpO2: 96% 95%    Last Pain:  Vitals:   05/11/21 0827  TempSrc:   PainSc: 2                  Reeves Musick

## 2021-05-12 NOTE — ED Notes (Signed)
[place] (daughter)

## 2021-05-12 NOTE — ED Provider Notes (Signed)
[facility] DEPT Provider Note   CSN: [id number] Arrival date & time: 05/12/21  1731     History  Chief Complaint  Patient presents with   Weakness    John Spencer is a 71 y.o. male.  71 year old male who presents with several days of weakness.  Patient had a partial prostatectomy performed several days ago.  Stated that he had been feeling weak even prior to beginning of surgery.  Notes that when he tries to eat he gags and does spit up.  No associated severe abdominal discomfort.  No cough or congestion.  No fever according to the patient.  States his weakness has been nonfocal.  No prior history of same      Home Medications Prior to Admission medications   Medication Sig Start Date End Date Taking? Authorizing Provider  docusate sodium (COLACE) 100 MG capsule Take 1 capsule (100 mg total) by mouth 2 (two) times daily. 05/09/21   Debbrah Alar, PA-C  finasteride (PROSCAR) 5 MG tablet Take 5 mg by mouth daily. 04/03/21   [provider]  HYDROcodone-acetaminophen (NORCO) 5-325 MG tablet Take 1-2 tablets by mouth every 6 (six) hours as needed for moderate pain or severe pain. 05/09/21   Debbrah Alar, PA-C  Polyethylene Glycol 3350 (MIRALAX PO) Take 1 Dose by mouth daily as needed (constipation). 1 dose = 1-2 teaspoons    [provider]  rosuvastatin (CRESTOR) 20 MG tablet TAKE 1 TABLET BY MOUTH EVERYDAY AT BEDTIME Patient not taking: Reported on 04/16/2021 02/03/21   Tolia, Sunit, DO  sulfamethoxazole-trimethoprim (BACTRIM DS) 800-160 MG tablet Take 1 tablet by mouth 2 (two) times daily. Start the day prior to foley removal appointment 05/09/21   Debbrah Alar, PA-C      Allergies    Patient has no known allergies.    Review of Systems   Review of Systems  All other systems reviewed and are negative.  Physical Exam Updated Vital Signs BP (!) 150/81    Pulse 77    Temp 98.5 F (36.9 C) (Oral)    Resp 16    Ht 1.816 m (5' 11.5")     Wt 78.5 kg    SpO2 96%    BMI 23.79 kg/m  Physical Exam Vitals and nursing note reviewed.  Constitutional:      General: He is not in acute distress.    Appearance: Normal appearance. He is well-developed. He is not toxic-appearing.  HENT:     Head: Normocephalic and atraumatic.  Eyes:     General: Lids are normal.     Conjunctiva/sclera: Conjunctivae normal.     Pupils: Pupils are equal, round, and reactive to light.  Neck:     Thyroid: No thyroid mass.     Trachea: No tracheal deviation.  Cardiovascular:     Rate and Rhythm: Normal rate and regular rhythm.     Heart sounds: Normal heart sounds. No murmur heard.   No gallop.  Pulmonary:     Effort: Pulmonary effort is normal. No respiratory distress.     Breath sounds: Normal breath sounds. No stridor. No decreased breath sounds, wheezing, rhonchi or rales.  Abdominal:     General: There is no distension.     Palpations: Abdomen is soft.     Tenderness: There is no abdominal tenderness. There is no rebound.  Musculoskeletal:        General: No tenderness. Normal range of motion.     Cervical back: Normal range of motion  and neck supple.  Skin:    General: Skin is warm and dry.     Findings: No abrasion or rash.  Neurological:     Mental Status: He is alert and oriented to person, place, and time. Mental status is at baseline.     GCS: GCS eye subscore is 4. GCS verbal subscore is 5. GCS motor subscore is 6.     Cranial Nerves: No cranial nerve deficit.     Sensory: No sensory deficit.     Motor: Motor function is intact.  Psychiatric:        Attention and Perception: Attention normal.        Mood and Affect: Affect is flat.        Speech: Speech normal.        Behavior: Behavior normal.    ED Results / Procedures / Treatments   Labs (all labs ordered are listed, but only abnormal results are displayed) Labs Reviewed  CBC WITH DIFFERENTIAL/PLATELET - Abnormal; Notable for the following components:      Result  Value   Neutro Abs 8.3 (*)    All other components within normal limits  BASIC METABOLIC PANEL - Abnormal; Notable for the following components:   Sodium 134 (*)    Glucose, Bld 130 (*)    Calcium 8.7 (*)    All other components within normal limits  URINALYSIS, ROUTINE W REFLEX MICROSCOPIC - Abnormal; Notable for the following components:   Color, Urine RED (*)    APPearance TURBID (*)    Glucose, UA   (*)    Value: TEST NOT REPORTED DUE TO COLOR INTERFERENCE OF URINE PIGMENT   Hgb urine dipstick   (*)    Value: TEST NOT REPORTED DUE TO COLOR INTERFERENCE OF URINE PIGMENT   Bilirubin Urine   (*)    Value: TEST NOT REPORTED DUE TO COLOR INTERFERENCE OF URINE PIGMENT   Ketones, ur   (*)    Value: TEST NOT REPORTED DUE TO COLOR INTERFERENCE OF URINE PIGMENT   Protein, ur   (*)    Value: TEST NOT REPORTED DUE TO COLOR INTERFERENCE OF URINE PIGMENT   Nitrite   (*)    Value: TEST NOT REPORTED DUE TO COLOR INTERFERENCE OF URINE PIGMENT   Leukocytes,Ua   (*)    Value: TEST NOT REPORTED DUE TO COLOR INTERFERENCE OF URINE PIGMENT   RBC / HPF >50 (*)    WBC, UA >50 (*)    Bacteria, UA MANY (*)    All other components within normal limits  URINE CULTURE  RESP PANEL BY RT-PCR (FLU A&B, COVID) [id number]    EKG None  Radiology No results found.  Procedures Procedures    Medications Ordered in ED Medications  lactated ringers bolus 1,000 mL (has no administration in time range)  lactated ringers infusion (has no administration in time range)    ED Course/ Medical Decision Making/ A&P                           Medical Decision Making Amount and/or Complexity of Data Reviewed Radiology: ordered.  Risk Prescription drug management.   Patient given IV fluids here for suspected dehydration.  Was also given antiemetics.  Concern for possible postoperative infection and chest x-ray was negative for acute process.  Urinalysis shows hematuria which is expected postsurgical.   Abdominal CT shows an ileus.  Patient has no evidence of leukocytosis therefore doubt he is  septic.  COVID and flu test negative.  Consult to to urology was obtained and the came and saw the patient and will admit for observation        Final Clinical Impression(s) / ED Diagnoses Final diagnoses:  None    Rx / DC Orders ED Discharge Orders     None         Lacretia Leigh, MD 05/12/21 2107

## 2021-05-13 MED ORDER — METOCLOPRAMIDE HCL 5 MG/ML IJ SOLN
10.0000 mg | Freq: Three times a day (TID) | INTRAMUSCULAR | Status: DC
  Administered 2021-05-13 – 2021-05-17 (×12): 10 mg via INTRAVENOUS
  Filled 2021-05-13 (×13): qty 2

## 2021-05-13 MED ORDER — SENNOSIDES-DOCUSATE SODIUM 8.6-50 MG PO TABS
1.0000 | ORAL_TABLET | Freq: Two times a day (BID) | ORAL | Status: DC
  Administered 2021-05-13 – 2021-05-14 (×2): 1 via ORAL
  Filled 2021-05-13 (×2): qty 1

## 2021-05-13 MED ORDER — CHLORHEXIDINE GLUCONATE CLOTH 2 % EX PADS
6.0000 | MEDICATED_PAD | Freq: Every day | CUTANEOUS | Status: DC
  Administered 2021-05-13 – 2021-05-16 (×4): 6 via TOPICAL

## 2021-05-13 NOTE — ED Notes (Signed)
Pt didn't want his dinner tray.

## 2021-05-13 NOTE — Progress Notes (Signed)
Urology Progress Note   Postop day 4 from robotic simple prostatectomy presenting to the ER with postoperative ileus  Subjective: NAEON.  Tolerating clears.  Antiemetics helping.  Foley continues to drain amber tea colored urine.  Signs are stable.  Objective: Vital signs in last 24 hours: Temp:  [98.5 F (36.9 C)] 98.5 F (36.9 C) (02/13 1739) Pulse Rate:  [63-95] 73 (02/14 0600) Resp:  [16] 16 (02/14 0600) BP: (144-176)/(60-84) 151/76 (02/14 0600) SpO2:  [92 %-98 %] 92 % (02/14 0600) Weight:  [78.5 kg] 78.5 kg (02/13 1739)  Intake/Output from previous day: 02/13 0701 - 02/14 0700 In: 1100 [IV Piggyback:1100] Out: -  Intake/Output this shift: No intake/output data recorded.  Physical Exam:  Constitutional:  Alert and oriented, No acute distress Cardiovascular: Regular rate and rhythm, No JVD Respiratory: Normal respiratory effort, Lungs clear bilaterally GI: Abdomen is soft, nontender, abdomen is mildly distended and tympanic to percussion GU: No CVA tenderness Foley in place draining amber-colored urine Lymphatic: No lymphadenopathy Neurologic: Grossly intact, no focal deficits Psychiatric: Normal mood and affect  Lab Results: Recent Labs    05/12/21 1807  HGB 14.1  HCT 41.7   Recent Labs    05/12/21 1807  NA 134*  K 3.7  CL 101  CO2 23  GLUCOSE 130*  BUN 18  CREATININE 0.93  CALCIUM 8.7*    Studies/Results: CT Abdomen Pelvis Wo Contrast  Result Date: 05/12/2021 CLINICAL DATA:  Generalized weakness, nausea, prostatectomy 4 days ago EXAM: CT ABDOMEN AND PELVIS WITHOUT CONTRAST TECHNIQUE: Multidetector CT imaging of the abdomen and pelvis was performed following the standard protocol without IV contrast. Unenhanced CT was performed per clinician order. Lack of IV contrast limits sensitivity and specificity, especially for evaluation of abdominal/pelvic solid viscera. RADIATION DOSE REDUCTION: This exam was performed according to the departmental  dose-optimization program which includes automated exposure control, adjustment of the mA and/or kV according to patient size and/or use of iterative reconstruction technique. COMPARISON:  06/16/2019 FINDINGS: Lower chest: Hypoventilatory changes are seen at the lung bases. No effusion or pneumothorax. Hepatobiliary: Unremarkable unenhanced appearance of the liver and gallbladder. Pancreas: Unremarkable unenhanced appearance. Spleen: Unremarkable unenhanced appearance. Adrenals/Urinary Tract: Stable left renal cysts. No urinary tract calculi or obstructive uropathy within either kidney. Adrenals are unremarkable. Bladder decompressed with a Foley catheter. Stomach/Bowel: There is mild diffuse dilation of the small bowel measuring up to 3.3 cm in diameter. There is also gaseous distension of the ascending and transverse colon. Numerous gas fluid levels. Findings are likely related to postoperative ileus. No bowel wall thickening or inflammatory change. Vascular/Lymphatic: Aortic atherosclerosis. No enlarged abdominal or pelvic lymph nodes. Reproductive: Postsurgical changes are seen from partial prostatectomy. Marked debulking the prostate after recent surgery. Foley catheter traverses the prostatic urethra. Seminal vesicles are unremarkable. Other: There is extensive subcutaneous gas within the right thigh, perineum, anterior abdominal wall consistent with recent laparoscopic surgery. A small amount of residual pneumoperitoneum is seen within the ventral abdomen, consistent with recent laparoscopy. No free fluid.  No abdominal wall hernia. Musculoskeletal: There are no acute or destructive bony lesions. Reconstructed images demonstrate no additional findings. IMPRESSION: 1. Large amount of subcutaneous gas as well as a small amount of residual pneumoperitoneum, consistent with recent surgical intervention and laparoscopic prostatectomy. 2. Diffuse distension of the small bowel and colon, with multiple gas fluid  levels, most compatible with postoperative ileus. 3. Marked debulking of the prostate, with Foley catheter traversing the prostatic urethra and decompressing the urinary bladder. 4.  Aortic Atherosclerosis (ICD10-I70.0). Electronically Signed   By: Randa Ngo M.D.   On: 05/12/2021 20:30   DG Chest Port 1 View  Result Date: 05/12/2021 CLINICAL DATA:  Cough EXAM: PORTABLE CHEST 1 VIEW COMPARISON:  None. FINDINGS: Linear atelectasis at the left base. Right lung clear. Heart is normal size. No effusions or acute bony abnormality. IMPRESSION: Left base atelectasis.  No active disease. Electronically Signed   By: Rolm Baptise M.D.   On: 05/12/2021 19:41    Assessment/Plan:  71 year old male who is status post robotic simple prostatectomy with Dr. Tresa Moore on 05/09/2021 presenting with postoperative ileus.  No fluid collection seen on CT scan.  Patient continues to pass gas.  Antiemetics helping.  -IV Reglan 10 mg every 8 hours -Home meds - Antiemetics - Normal saline at 75 cc/h - Clear liquid diet - Ceftriaxone for now while urine culture is pending  Dispo: Awaiting return of bowel function   LOS: 0 days

## 2021-05-13 NOTE — Plan of Care (Signed)

## 2021-05-14 DIAGNOSIS — I252 Old myocardial infarction: Secondary | ICD-10-CM | POA: Diagnosis not present

## 2021-05-14 DIAGNOSIS — Z87891 Personal history of nicotine dependence: Secondary | ICD-10-CM | POA: Diagnosis not present

## 2021-05-14 DIAGNOSIS — K567 Ileus, unspecified: Secondary | ICD-10-CM | POA: Diagnosis present

## 2021-05-14 DIAGNOSIS — Z800 Family history of malignant neoplasm of digestive organs: Secondary | ICD-10-CM | POA: Diagnosis not present

## 2021-05-14 DIAGNOSIS — I2510 Atherosclerotic heart disease of native coronary artery without angina pectoris: Secondary | ICD-10-CM | POA: Diagnosis present

## 2021-05-14 DIAGNOSIS — Y836 Removal of other organ (partial) (total) as the cause of abnormal reaction of the patient, or of later complication, without mention of misadventure at the time of the procedure: Secondary | ICD-10-CM | POA: Diagnosis present

## 2021-05-14 DIAGNOSIS — Z8249 Family history of ischemic heart disease and other diseases of the circulatory system: Secondary | ICD-10-CM | POA: Diagnosis not present

## 2021-05-14 DIAGNOSIS — Z955 Presence of coronary angioplasty implant and graft: Secondary | ICD-10-CM | POA: Diagnosis not present

## 2021-05-14 DIAGNOSIS — Z20822 Contact with and (suspected) exposure to covid-19: Secondary | ICD-10-CM | POA: Diagnosis present

## 2021-05-14 DIAGNOSIS — R8271 Bacteriuria: Secondary | ICD-10-CM | POA: Diagnosis present

## 2021-05-14 DIAGNOSIS — E119 Type 2 diabetes mellitus without complications: Secondary | ICD-10-CM | POA: Diagnosis present

## 2021-05-14 LAB — URINE CULTURE: Culture: NO GROWTH

## 2021-05-14 LAB — SURGICAL PATHOLOGY

## 2021-05-14 NOTE — Progress Notes (Signed)
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 Remains finasteride post-op. Path pending.   2 - Ileus - New bad distension and nausea (minimal emesis) 3 days after simple prostatectomy. ER labs 2/13 without elevated Cr or luekocytosis. NO fevers. CT c/w ileus (no fluid collections, no transition points). Placed on supportive regimen with scheduled reglan, scheduled Sennakot S, IVF, PRN nausea meds.    3 - Bacteruria - expected bacteruria with subchronic foley. NO fevers / leukocytosis. Piror CX sens rocephin and received dose in ER.    Today "John Spencer" is feeling much improved. +BM x several, tollerating PO liquids, much less abd distension, walking some. Overall better spirits as well.   Objective: Vital signs in last 24 hours: Temp:  [97.7 F (36.5 C)-98.4 F (36.9 C)] 98.4 F (36.9 C) (02/15 1314) Pulse Rate:  [62-90] 64 (02/15 1314) Resp:  [16-18] 16 (02/15 1314) BP: (135-164)/(69-90) 135/70 (02/15 1314) SpO2:  [94 %-99 %] 96 % (02/15 1314) Last BM Date : 05/08/21  Intake/Output from previous day: 02/14 0701 - 02/15 0700 In: 1288.3 [P.O.:240; I.V.:1048.3] Out: 870 [Urine:870] Intake/Output this shift: No intake/output data recorded.  EXAM: NAD, AOx3, near baseline. Daughter in room. Both pleasant Non-labored breathing on RA Reg HR Much improved abd distension. No r/g. Recent surgical sites c/d/I Foley in place with tea colored urine, no clots No c/c/e  Lab Results:  Recent Labs    05/12/21 1807  WBC 10.3  HGB 14.1  HCT 41.7  PLT 307   BMET Recent Labs    05/12/21 1807  NA 134*  K 3.7  CL 101  CO2 23  GLUCOSE 130*  BUN 18  CREATININE 0.93  CALCIUM 8.7*   PT/INR No results for input(s): LABPROT, INR in the last 72 hours. ABG No results for input(s): PHART, HCO3 in the last 72 hours.  Invalid input(s): PCO2,  PO2  Studies/Results: CT Abdomen Pelvis Wo Contrast  Result Date: 05/12/2021 CLINICAL DATA:  Generalized weakness, nausea, prostatectomy 4 days ago EXAM: CT ABDOMEN AND PELVIS WITHOUT CONTRAST TECHNIQUE: Multidetector CT imaging of the abdomen and pelvis was performed following the standard protocol without IV contrast. Unenhanced CT was performed per clinician order. Lack of IV contrast limits sensitivity and specificity, especially for evaluation of abdominal/pelvic solid viscera. RADIATION DOSE REDUCTION: This exam was performed according to the departmental dose-optimization program which includes automated exposure control, adjustment of the mA and/or kV according to patient size and/or use of iterative reconstruction technique. COMPARISON:  06/16/2019 FINDINGS: Lower chest: Hypoventilatory changes are seen at the lung bases. No effusion or pneumothorax. Hepatobiliary: Unremarkable unenhanced appearance of the liver and gallbladder. Pancreas: Unremarkable unenhanced appearance. Spleen: Unremarkable unenhanced appearance. Adrenals/Urinary Tract: Stable left renal cysts. No urinary tract calculi or obstructive uropathy within either kidney. Adrenals are unremarkable. Bladder decompressed with a Foley catheter. Stomach/Bowel: There is mild diffuse dilation of the small bowel measuring up to 3.3 cm in diameter. There is also gaseous distension of the ascending and transverse colon. Numerous gas fluid levels. Findings are likely related to postoperative ileus. No bowel wall thickening or inflammatory change. Vascular/Lymphatic: Aortic atherosclerosis. No enlarged abdominal or pelvic lymph nodes. Reproductive: Postsurgical changes are seen from partial prostatectomy. Marked debulking the prostate after recent surgery. Foley catheter traverses the prostatic urethra. Seminal vesicles are unremarkable. Other: There is extensive subcutaneous gas within the right thigh, perineum, anterior abdominal wall consistent  with recent laparoscopic  surgery. A small amount of residual pneumoperitoneum is seen within the ventral abdomen, consistent with recent laparoscopy. No free fluid.  No abdominal wall hernia. Musculoskeletal: There are no acute or destructive bony lesions. Reconstructed images demonstrate no additional findings. IMPRESSION: 1. Large amount of subcutaneous gas as well as a small amount of residual pneumoperitoneum, consistent with recent surgical intervention and laparoscopic prostatectomy. 2. Diffuse distension of the small bowel and colon, with multiple gas fluid levels, most compatible with postoperative ileus. 3. Marked debulking of the prostate, with Foley catheter traversing the prostatic urethra and decompressing the urinary bladder. 4.  Aortic Atherosclerosis (ICD10-I70.0). Electronically Signed   By: Randa Ngo M.D.   On: 05/12/2021 20:30   DG Chest Port 1 View  Result Date: 05/12/2021 CLINICAL DATA:  Cough EXAM: PORTABLE CHEST 1 VIEW COMPARISON:  None. FINDINGS: Linear atelectasis at the left base. Right lung clear. Heart is normal size. No effusions or acute bony abnormality. IMPRESSION: Left base atelectasis.  No active disease. Electronically Signed   By: Rolm Baptise M.D.   On: 05/12/2021 19:41    Anti-infectives: Anti-infectives (From admission, onward)    Start     Dose/Rate Route Frequency Ordered Stop   05/12/21 2130  cefTRIAXone (ROCEPHIN) 1 g in sodium chloride 0.9 % 100 mL IVPB  Status:  Discontinued        1 g 200 mL/hr over 30 Minutes Intravenous Every 24 hours 05/12/21 2120 05/13/21 1226       Assessment/Plan:  Reinforced goals of hospitaliation being resumed bowel function and PO intake to sustain nutrition. He is making progress. Adv to reg diet. DC senna, continue reglan. Possible DC tomorrow based on current progress.   Alexis Frock 05/14/2021

## 2021-05-14 NOTE — Progress Notes (Signed)
Transition of Care [facility]) Screening Note  Patient Details  Name: John Spencer Date of Birth: Mar 01, 1951  Transition of Care [facility]) CM/SW Contact:    Sherie Don, LCSW Phone Number: 05/14/2021, 10:47 AM  Transition of Care Department [facility]) has reviewed patient and no TOC needs have been identified at this time. We will continue to monitor patient advancement through interdisciplinary progression rounds. If new patient transition needs arise, please place a TOC consult.

## 2021-05-15 MED ORDER — PANTOPRAZOLE SODIUM 40 MG PO TBEC
40.0000 mg | DELAYED_RELEASE_TABLET | Freq: Every day | ORAL | Status: DC
  Administered 2021-05-15 – 2021-05-16 (×2): 40 mg via ORAL
  Filled 2021-05-15 (×2): qty 1

## 2021-05-15 MED ORDER — SENNOSIDES-DOCUSATE SODIUM 8.6-50 MG PO TABS
1.0000 | ORAL_TABLET | Freq: Two times a day (BID) | ORAL | Status: DC
  Administered 2021-05-15 – 2021-05-16 (×4): 1 via ORAL
  Filled 2021-05-15 (×4): qty 1

## 2021-05-15 MED ORDER — PROMETHAZINE HCL 25 MG PO TABS: 25.0000 mg | ORAL_TABLET | Freq: Three times a day (TID) | ORAL | Status: DC | PRN

## 2021-05-15 MED ORDER — HEPARIN SODIUM (PORCINE) 5000 UNIT/ML IJ SOLN
5000.0000 [IU] | Freq: Three times a day (TID) | INTRAMUSCULAR | Status: DC
  Administered 2021-05-15 – 2021-05-17 (×7): 5000 [IU] via SUBCUTANEOUS
  Filled 2021-05-15 (×7): qty 1

## 2021-05-15 MED ORDER — SODIUM CHLORIDE 0.9 % IV SOLN
12.5000 mg | Freq: Four times a day (QID) | INTRAVENOUS | Status: DC | PRN
  Administered 2021-05-16: 12.5 mg via INTRAVENOUS
  Filled 2021-05-15: qty 0.5
  Filled 2021-05-15: qty 12.5

## 2021-05-15 NOTE — Progress Notes (Signed)
° °  Subjective/Chief Complaint:  Having flatus and loose BM's. Remains somewhat distended and bloating, with burping. Tolerated few bites of dinner yesterday and became nauseous, Resolved with zofran. VSS.   Objective: Vital signs in last 24 hours: Temp:  [97.5 F (36.4 C)-98.8 F (37.1 C)] 97.5 F (36.4 C) (02/16 0442) Pulse Rate:  [64-73] 69 (02/16 0442) Resp:  [16-18] 18 (02/16 0442) BP: (135-156)/(67-74) 156/74 (02/16 0442) SpO2:  [95 %-96 %] 96 % (02/16 0442) Last BM Date : 05/08/21  Intake/Output from previous day: 02/15 0701 - 02/16 0700 In: 1782.8 [P.O.:600; I.V.:1182.8] Out: 1000 [Urine:1000] Intake/Output this shift: No intake/output data recorded.  EXAM: NAD, AOx3, near baseline. Daughter in room. Both pleasant Non-labored breathing on RA Reg HR Mild abd distension. Tympanic to percusison No r/g. Recent surgical sites c/d/I Foley in place with tea colored urine, no clots No c/c/e  Lab Results:  Recent Labs    05/12/21 1807  WBC 10.3  HGB 14.1  HCT 41.7  PLT 307    BMET Recent Labs    05/12/21 1807  NA 134*  K 3.7  CL 101  CO2 23  GLUCOSE 130*  BUN 18  CREATININE 0.93  CALCIUM 8.7*    PT/INR No results for input(s): LABPROT, INR in the last 72 hours. ABG No results for input(s): PHART, HCO3 in the last 72 hours.  Invalid input(s): PCO2, PO2  Studies/Results: No results found.  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 Remains finasteride post-op. Path resulted revealing  Benign prostatic  glandular and stromal hyperplasia    2 - Ileus - New bad distension and nausea (minimal emesis) 3 days after simple prostatectomy. ER labs 2/13 without elevated Cr or luekocytosis. NO fevers. CT c/w ileus (no fluid collections, no transition points). Placed on supportive regimen with scheduled reglan, IVF, PRN nausea meds.    3 - Bacteruria - expected bacteruria with subchronic foley. NO fevers / leukocytosis. Piror CX sens rocephin and received dose in ER.   Reinforced goals of hospitaliation being resumed bowel function and PO intake to sustain nutrition. He is making progress. Continue reg diet. Patient self regulating. continue reglan. Starting PPI today for GI prophylaxis/Dysphagia. Also on [facility] for chemical DVT ppx.   John Spencer 05/15/2021

## 2021-05-15 NOTE — TOC Progression Note (Signed)
Transition of Care [facility]) - Progression Note   Patient Details  Name: John Spencer MRN: [id number] Date of Birth: [birth year]  Transition of Care [facility]) CM/SW [place], LCSW Phone Number: 05/15/2021, 10:43 AM  Clinical Narrative: CSW notified by staff that patient's daughter, John Spencer, was requesting a call back about discharge. CSW spoke with daughter. Per daughter, she has concerns about the patient discharging home and asked if services could be set up to "check on" the patient at home. CSW explained the patient would only get [facility] services if he has a skillable need and having someone "check on" him would be private duty, which the patient or family would have to set up and would not be covered by insurance.    Barriers to Discharge: No Barriers Identified  Readmission Risk Interventions No flowsheet data found.

## 2021-05-16 NOTE — Progress Notes (Signed)
° °  Subjective/Chief Complaint: Patient is having more flatus and continues to have loose bowel movements.  Abdominal distention is improving.  Vital signs are stable.  Interested in eating today.  He is not experiencing nausea.  Objective: Vital signs in last 24 hours: Temp:  [98.1 F (36.7 C)-98.3 F (36.8 C)] 98.1 F (36.7 C) (02/17 0536) Pulse Rate:  [57-64] 64 (02/17 0536) Resp:  [14-18] 16 (02/17 0536) BP: (154-163)/(70-72) 154/70 (02/17 0536) SpO2:  [96 %-98 %] 96 % (02/17 0536) Last BM Date : 05/08/21  Intake/Output from previous day: 02/16 0701 - 02/17 0700 In: 1729.5 [P.O.:720; I.V.:1009.5] Out: 1070 [Urine:1070] Intake/Output this shift: Total I/O In: 677.5 [P.O.:240; I.V.:387.5; IV Piggyback:50] Out: 600 [Urine:600]  EXAM: NAD, AOx3, near baseline. Daughter in room. Both pleasant Non-labored breathing on RA Reg HR Much improved minimal abd distension. No r/g. Recent surgical sites c/d/I Foley in place with tea colored urine, no clots No c/c/e  Lab Results:  No results for input(s): WBC, HGB, HCT, PLT in the last 72 hours.  BMET No results for input(s): NA, K, CL, CO2, GLUCOSE, BUN, CREATININE, CALCIUM in the last 72 hours.  PT/INR No results for input(s): LABPROT, INR in the last 72 hours. ABG No results for input(s): PHART, HCO3 in the last 72 hours.  Invalid input(s): PCO2, PO2  Studies/Results: No results found.  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 Remains finasteride post-op. Path resulted revealing   Benign prostatic glandular and stromal hyperplasia discussed these findings with the patient.   2 - Ileus - New bad distension and nausea (minimal emesis) 3 days after simple prostatectomy. ER labs 2/13 without elevated Cr or luekocytosis. NO fevers. CT c/w ileus (no fluid collections, no transition points). Placed on supportive regimen with scheduled reglan, IVF, PRN nausea meds.  Improving significantly.  Patient will eat today.  If able to eat without nausea or vomiting and confident that he can maintain his nutritional goals, patient may be able to discharge.  He has follow-up arranged on Monday, 05/19/2021.   3 - Bacteruria - expected bacteruria with subchronic foley. NO fevers / leukocytosis. [place] CX sens rocephin and received dose in ER.  Urine culture no growth.  Reinforced goals of hospitaliation being resumed bowel function and PO intake to sustain nutrition. He is making progress. Continue reg diet. Patient self regulating.  Potentially home today if able to eat without nausea vomiting.  John Spencer 05/16/2021

## 2021-05-17 NOTE — Discharge Instructions (Signed)

## 2021-05-17 NOTE — Discharge Summary (Addendum)
[facility] Discharge Summary  Admit date: 05/12/2021  Discharge date and time: 05/17/21   Discharge to: Home  Discharge Service: Urology  Discharge Attending Physician:  Harold Barban, MD  Discharge  Diagnoses: Ileus, postoperative [facility])  Secondary Diagnosis: Principal Problem:   Ileus, postoperative ([place])   OR Procedures:  * No surgery found *   Ancillary Procedures: None   Discharge Day Services: The patient was seen and examined by the Urology team both in the morning and immediately prior to discharge.  Vital signs and laboratory values were stable and within normal limits.  The physical exam was benign and unchanged and all surgical wounds were examined.  Discharge instructions were explained and all questions answered.  Subjective  No acute events overnight. Pain Controlled. No fever or chills.  Objective Patient Vitals for the past 8 hrs:  BP Temp Temp src Pulse Resp SpO2  05/17/21 0521 (!) 144/69 98.1 F (36.7 C) Oral 66 14 96 %   No intake/output data recorded.  General Appearance:        No acute distress Lungs:                       Normal work of breathing on room air Heart:                                Regular rate and rhythm Abdomen:                         Soft, non-tender, non-distended. Incisions C/D/I.  GU: Foley in place with clear yellow. Extremities:                      Warm and well perfused   Hospital Course:  This is a patient with a history of urinary retention from significantly enlarged prostatic hyperplasia who is status post robotic simple prostatectomy on 05/09/2021 with Dr. Tresa Moore.  Procedure was uncomplicated and patient remained on finasteride postoperatively.  He was discharged on 05/11/2021 after a relatively uncomplicated hospital course.  He represented to the hospital on the 13th with nausea, lack of p.o. intake.  He was passing gas at that time.  CT scan was obtained revealing a postoperative ileus.  Dilated small and large  bowel.  No fluid collection.  Foley cath remain in place and clear yellow urine output.  He was managed conservatively.  Diet was slowly advanced.  Patient received IV Reglan and stool softeners.  By hospital day 5 patient was tolerating a regular diet without any nausea and continued to have bowel movements.  He was no longer distended and he was comfortable.  He was ambulating.  He was discharged and will follow up on 05/20/2021 as planned with Dr. Bess Harvest for trial of void and postop check.  Condition at Discharge: Improved  Discharge Medications:  Allergies as of 05/17/2021   No Known Allergies      Medication List     STOP taking these medications    HYDROcodone-acetaminophen 5-325 MG tablet Commonly known as: Norco       TAKE these medications    docusate sodium 100 MG capsule Commonly known as: COLACE Take 1 capsule (100 mg total) by mouth 2 (two) times daily. What changed:  when to take this reasons to take this   MIRALAX PO Take 1 Dose by mouth daily as needed (constipation). 1 dose =  1-2 teaspoons   oxyCODONE 5 MG immediate release tablet Commonly known as: Oxy IR/ROXICODONE Take 5 mg by mouth every 6 (six) hours as needed.   rosuvastatin 20 MG tablet Commonly known as: CRESTOR TAKE 1 TABLET BY MOUTH EVERYDAY AT BEDTIME   sulfamethoxazole-trimethoprim 800-160 MG tablet Commonly known as: BACTRIM DS Take 1 tablet by mouth 2 (two) times daily. Start the day prior to foley removal appointment        I performed a history and physical examination of the patient and discussed the patients management with the resident.  I reviewed the resident's note and agree with the documented findings and plan of care.

## 2021-05-17 NOTE — Plan of Care (Signed)
Problem: Education: °Goal: Knowledge of General Education information will improve °Description: Including pain rating scale, medication(s)/side effects and non-pharmacologic comfort measures °Outcome: Completed/Met °  °

## 2021-06-12 DIAGNOSIS — I2510 Atherosclerotic heart disease of native coronary artery without angina pectoris: Secondary | ICD-10-CM | POA: Diagnosis not present

## 2021-06-12 DIAGNOSIS — I10 Essential (primary) hypertension: Secondary | ICD-10-CM | POA: Diagnosis not present

## 2021-06-12 DIAGNOSIS — R739 Hyperglycemia, unspecified: Secondary | ICD-10-CM | POA: Diagnosis not present

## 2021-06-12 DIAGNOSIS — R82998 Other abnormal findings in urine: Secondary | ICD-10-CM | POA: Diagnosis not present

## 2021-06-19 DIAGNOSIS — Z961 Presence of intraocular lens: Secondary | ICD-10-CM | POA: Diagnosis not present

## 2021-06-19 DIAGNOSIS — H524 Presbyopia: Secondary | ICD-10-CM | POA: Diagnosis not present

## 2021-06-19 DIAGNOSIS — H04123 Dry eye syndrome of bilateral lacrimal glands: Secondary | ICD-10-CM | POA: Diagnosis not present

## 2021-06-19 DIAGNOSIS — H25811 Combined forms of age-related cataract, right eye: Secondary | ICD-10-CM | POA: Diagnosis not present

## 2021-08-19 DIAGNOSIS — N5201 Erectile dysfunction due to arterial insufficiency: Secondary | ICD-10-CM | POA: Diagnosis not present

## 2021-08-19 DIAGNOSIS — R338 Other retention of urine: Secondary | ICD-10-CM | POA: Diagnosis not present

## 2021-08-19 DIAGNOSIS — C61 Malignant neoplasm of prostate: Secondary | ICD-10-CM | POA: Diagnosis not present

## 2021-10-28 DIAGNOSIS — M25511 Pain in right shoulder: Secondary | ICD-10-CM | POA: Diagnosis not present

## 2021-11-25 ENCOUNTER — Encounter: Payer: Self-pay | Admitting: Rehabilitative and Restorative Service Providers"

## 2021-11-25 ENCOUNTER — Other Ambulatory Visit: Payer: Self-pay

## 2021-11-25 ENCOUNTER — Ambulatory Visit
Payer: No Typology Code available for payment source | Attending: Sports Medicine | Admitting: Rehabilitative and Restorative Service Providers"

## 2021-11-25 DIAGNOSIS — G8929 Other chronic pain: Secondary | ICD-10-CM | POA: Diagnosis not present

## 2021-11-25 DIAGNOSIS — M6281 Muscle weakness (generalized): Secondary | ICD-10-CM | POA: Diagnosis not present

## 2021-11-25 DIAGNOSIS — M25511 Pain in right shoulder: Secondary | ICD-10-CM | POA: Insufficient documentation

## 2021-11-25 DIAGNOSIS — R252 Cramp and spasm: Secondary | ICD-10-CM | POA: Diagnosis not present

## 2021-11-25 NOTE — Therapy (Signed)
OUTPATIENT PHYSICAL THERAPY SHOULDER EVALUATION   Patient Name: John Spencer MRN: [id number] DOB:13-May-1950, 71 y.o., male Today's Date: 11/25/2021   PT End of Session - 11/25/21 1405     Visit Number 1    Date for PT Re-Evaluation 01/16/22    Authorization Type [employer]    PT Start Time 1400    PT Stop Time 1438    PT Time Calculation (min) 38 min    Activity Tolerance Patient tolerated treatment well    Behavior During Therapy WFL for tasks assessed/performed             Past Medical History:  Diagnosis Date   At average risk for colon cancer    BPH (benign prostatic hyperplasia)    Cancer ([place])    Coronary artery disease    Prior PCI x4, per patient.   Depression    Diabetes mellitus without complication ([place])    Heart attack ([place])    Internal carotid artery stenosis, left    Pneumonia    Past Surgical History:  Procedure Laterality Date   APPENDECTOMY     XI ROBOTIC ASSISTED SIMPLE PROSTATECTOMY N/A 05/09/2021   Procedure: XI ROBOTIC ASSISTED SIMPLE PROSTATECTOMY;  Surgeon: Alexis Frock, MD;  Location: [facility];  Service: Urology;  Laterality: N/A;   Patient Active Problem List   Diagnosis Date Noted   Ileus, postoperative ([place]) 05/12/2021   BPH (benign prostatic hyperplasia) 05/09/2021    PCP: Marda Stalker, PA-C  REFERRING PROVIDER: Inez Catalina, MD   REFERRING DIAG: M25.511 (ICD-10-CM) - Pain in right shoulder   THERAPY DIAG:  Muscle weakness (generalized)  Cramp and spasm  Chronic right shoulder pain  Rationale for Evaluation and Treatment Rehabilitation  ONSET DATE: July 2023  SUBJECTIVE:                                                                                                                                                                                      SUBJECTIVE STATEMENT: Pt reports that he has had right shoulder pain for a while.  He states that Dr Sheppard Coil has given him a couple of injections that helped,  but "I guess it wore off."  Pt reports that he enjoys playing [employer] and used to play tennis.  PERTINENT HISTORY: CAD, Prostate Cancer, BPH  PAIN:  Are you having pain? Yes: NPRS scale: 5-8/10 Pain location: R shoulder Pain description: sharp, aching Aggravating factors: certain exercises/movements Relieving factors: rest  PRECAUTIONS: None  WEIGHT BEARING RESTRICTIONS No  FALLS:  Has patient fallen in last 6 months? No  LIVING ENVIRONMENT: Lives with: lives alone Lives in: House/apartment 2nd floor apartment Stairs: Yes: External: 10  steps; on left going up Has following equipment at home: None  OCCUPATION: Retired Child psychotherapist  PLOF: Independent  PATIENT GOALS:  To decrease pain and be able to continue playing pickle ball.  OBJECTIVE:   DIAGNOSTIC FINDINGS:  Pt reports Dr Sheppard Coil has done imaging that revealed shoulder OA  PATIENT SURVEYS:  11/25/2021:  FOTO 63% (projected 68% by visit 9)  COGNITION:  Overall cognitive status: Within functional limits for tasks assessed     SENSATION: WFL  POSTURE: WFL  UPPER EXTREMITY ROM:   Active ROM Right eval Left eval  Shoulder flexion 125 145  Shoulder extension 125 136  Shoulder abduction    Shoulder adduction    Shoulder internal rotation    Shoulder external rotation    Elbow flexion    Elbow extension    Wrist flexion    Wrist extension    Wrist ulnar deviation    Wrist radial deviation    Wrist pronation    Wrist supination    (Blank rows = not tested)  UPPER EXTREMITY MMT: R shoulder strength of 5-/5, L shoulder strength of 5/5 R grip strength 85#, L grip strength 85#   SHOULDER SPECIAL TESTS:  11/25/2021: Impingement tests: Hawkins/Kennedy impingement test: positive on right  Rotator cuff assessment: Empty can test: negative   PALPATION:  Denies tenderness to palpation   TODAY'S TREATMENT:  11/25/2021: Wall wash for shoulder flexion and abduction x10 reps Shoulder flexion serratus  activation with red tband x10 Counter stretch x20 sec Doorway pec stretch x20 sec   PATIENT EDUCATION: Education details: Issued HEP and provided with red tband Person educated: Patient Education method: Explanation, Demonstration, and Handouts Education comprehension: verbalized understanding and returned demonstration   HOME EXERCISE PROGRAM: Access Code: [email or web address] URL: [email or web address] Date: 11/25/2021 Prepared by: Shelby Dubin Myya Meenach  Exercises - Shoulder Flexion Wall Slide with Towel  - 1 x daily - 7 x weekly - 2 sets - 10 reps - Standing Shoulder Abduction Slides at Wall  - 1 x daily - 7 x weekly - 2 sets - 10 reps - Shoulder Flexion Serratus Activation with Resistance  - 1 x daily - 7 x weekly - 2 sets - 10 reps - Standing 'L' Stretch at Counter  - 1 x daily - 7 x weekly - 1 sets - 2 reps - 20 sec hold - Doorway Pec Stretch at 60 Elevation  - 1 x daily - 7 x weekly - 1 sets - 2 reps - 20 sec hold  ASSESSMENT:  CLINICAL IMPRESSION: Patient is a 71 y.o. male who was seen today for physical therapy evaluation and treatment for right shoulder pain. Pts PLOF if independent and able to play pickle ball weekly without increased pain.  Pt states that earlier this summer, he started having right shoulder pain and the injections helped, but he is continuing to have pain.  Pt presents with right shoulder weakness when compared to left shoulder with pain and decreased right shoulder A/ROM.  Pt would benefit from skilled PT to address his impairments to allow him to meet his functional goals.   OBJECTIVE IMPAIRMENTS decreased ROM, decreased strength, impaired UE functional use, and pain.   ACTIVITY LIMITATIONS carrying and lifting  PARTICIPATION LIMITATIONS: community activity and pickle ball  PERSONAL FACTORS 1-2 comorbidities: shoulder OA  are also affecting patient's functional outcome.   REHAB POTENTIAL: Good  CLINICAL DECISION MAKING:  Stable/uncomplicated  EVALUATION COMPLEXITY: Low   GOALS: Goals reviewed with patient? Yes  SHORT TERM GOALS: Target date:  12/16/2021  (Remove Blue Hyperlink)  Pt will be independent with initial HEP. Baseline: Goal status: INITIAL  2.  Pt will increase right shoulder A/ROM to [facility] to allow him to easily lift overhead. Baseline: R shoulder flexion and abduction 125 degrees Goal status: INITIAL   LONG TERM GOALS: Target date: 01/16/2022    Pt will be independent with advanced HEP. Baseline:  Goal status: INITIAL  2.  Pt will increase FOTO to at least 68% to demonstrate improvements in functional mobility. Baseline: 63% Goal status: INITIAL  3.  Pt will increase right shoulder strength to at least 5/5 to allow him to perform all required tasks without difficulty. Baseline: 5-/5 Goal status: INITIAL  4.  Pt to report pain no greater than 3/10 during typical daily activities and pickle ball. Baseline:  Goal status: INITIAL    PLAN: PT FREQUENCY: 1-2x/week  PT DURATION: 8 weeks  PLANNED INTERVENTIONS: Therapeutic exercises, Therapeutic activity, Neuromuscular re-education, Balance training, Gait training, Patient/Family education, Self Care, Joint mobilization, Joint manipulation, Aquatic Therapy, Dry Needling, Electrical stimulation, Cryotherapy, Moist heat, Taping, Vasopneumatic device, Ultrasound, Ionotophoresis '4mg'$ /ml Dexamethasone, Manual therapy, and Re-evaluation  PLAN FOR NEXT SESSION: assess and progress HEP, scapular stability, flexibility, strengthening   Juel Burrow, PT 11/25/2021, 3:04 PM   [facility] [address], [place],  [address] Phone # [phone] Fax [phone]

## 2021-11-26 DIAGNOSIS — M25511 Pain in right shoulder: Secondary | ICD-10-CM | POA: Diagnosis not present

## 2021-12-08 ENCOUNTER — Ambulatory Visit: Payer: No Typology Code available for payment source | Attending: Sports Medicine | Admitting: Physical Therapy

## 2021-12-08 ENCOUNTER — Encounter: Payer: Self-pay | Admitting: Physical Therapy

## 2021-12-08 DIAGNOSIS — M25511 Pain in right shoulder: Secondary | ICD-10-CM | POA: Insufficient documentation

## 2021-12-08 DIAGNOSIS — R252 Cramp and spasm: Secondary | ICD-10-CM

## 2021-12-08 DIAGNOSIS — M6281 Muscle weakness (generalized): Secondary | ICD-10-CM

## 2021-12-08 DIAGNOSIS — G8929 Other chronic pain: Secondary | ICD-10-CM | POA: Diagnosis not present

## 2021-12-08 NOTE — Therapy (Signed)
OUTPATIENT PHYSICAL THERAPY TREATMENT NOTE   Patient Name: John Spencer MRN: [id number] DOB:1950/11/19, 71 y.o., male Today's Date: 12/08/2021  PCP: Marda Stalker, PA-C REFERRING PROVIDER: Inez Catalina, MD     END OF SESSION:   PT End of Session - 12/08/21 1435     Visit Number 2    Date for PT Re-Evaluation 01/16/22    Authorization Type [employer]    PT Start Time 1436    PT Stop Time 1515    PT Time Calculation (min) 39 min    Activity Tolerance Patient tolerated treatment well    Behavior During Therapy WFL for tasks assessed/performed             Past Medical History:  Diagnosis Date   At average risk for colon cancer    BPH (benign prostatic hyperplasia)    Cancer ([place])    Coronary artery disease    Prior PCI x4, per patient.   Depression    Diabetes mellitus without complication ([place])    Heart attack ([place])    Internal carotid artery stenosis, left    Pneumonia    Past Surgical History:  Procedure Laterality Date   APPENDECTOMY     XI ROBOTIC ASSISTED SIMPLE PROSTATECTOMY N/A 05/09/2021   Procedure: XI ROBOTIC ASSISTED SIMPLE PROSTATECTOMY;  Surgeon: Alexis Frock, MD;  Location: [facility];  Service: Urology;  Laterality: N/A;   Patient Active Problem List   Diagnosis Date Noted   Ileus, postoperative ([place]) 05/12/2021   BPH (benign prostatic hyperplasia) 05/09/2021    REFERRING DIAG: M25.511 (ICD-10-CM) - Pain in right shoulder     THERAPY DIAG:  Muscle weakness (generalized)  Cramp and spasm  Chronic right shoulder pain  Rationale for Evaluation and Treatment Rehabilitation  PERTINENT HISTORY: CAD, Prostate Cancer, BPH    PRECAUTIONS: None  SUBJECTIVE: Doing my exercises. Pain with certain movements.   PAIN:  Are you having pain?  Only with certain movements.    OBJECTIVE: (objective measures completed at initial evaluation unless otherwise dated)  DIAGNOSTIC FINDINGS:  Pt reports Dr Sheppard Coil has done imaging that  revealed shoulder OA   PATIENT SURVEYS:  11/25/2021:  FOTO 63% (projected 68% by visit 9)   COGNITION:           Overall cognitive status: Within functional limits for tasks assessed                                  SENSATION: WFL   POSTURE: WFL   UPPER EXTREMITY ROM:    Active ROM Right eval Left eval Right 12/08/21  Shoulder flexion 125 145 135  Shoulder extension 125 136   Shoulder abduction       Shoulder adduction       Shoulder internal rotation       Shoulder external rotation       Elbow flexion       Elbow extension       Wrist flexion       Wrist extension       Wrist ulnar deviation       Wrist radial deviation       Wrist pronation       Wrist supination       (Blank rows = not tested)   UPPER EXTREMITY MMT: R shoulder strength of 5-/5, L shoulder strength of 5/5 R grip strength 85#, L grip strength 85#     SHOULDER  SPECIAL TESTS:            11/25/2021: Impingement tests: Hawkins/Kennedy impingement test: positive on right            Rotator cuff assessment: Empty can test: negative             PALPATION:  Denies tenderness to palpation             TODAY'S TREATMENT:    12/08/21: Wall washes: flex/abd 10x each Doorway pec stretch 10-20 sec hold 3x: Counter top stretch: 20 sec 2x Red band overhead with stretch 10x Red band ER/IR 10x each added to HEp  Small soft ball alphabet on wall  Wall pushups Arm bike: L1 3x3 wit hPT Apresent to monitor.  11/25/2021: Wall wash for shoulder flexion and abduction x10 reps Shoulder flexion serratus activation with red tband x10 Counter stretch x20 sec Doorway pec stretch x20 sec     PATIENT EDUCATION: Education details: Issued HEP and provided with red tband Person educated: Patient Education method: Explanation, Demonstration, and Handouts Education comprehension: verbalized understanding and returned demonstration     HOME EXERCISE PROGRAM: Access Code: [email or web address] URL:  [email or web address] Date: 11/25/2021 Prepared by: Shelby Dubin Menke   Exercises - Shoulder Flexion Wall Slide with Towel  - 1 x daily - 7 x weekly - 2 sets - 10 reps - Standing Shoulder Abduction Slides at Wall  - 1 x daily - 7 x weekly - 2 sets - 10 reps - Shoulder Flexion Serratus Activation with Resistance  - 1 x daily - 7 x weekly - 2 sets - 10 reps - Standing 'L' Stretch at Counter  - 1 x daily - 7 x weekly - 1 sets - 2 reps - 20 sec hold - Doorway Pec Stretch at 60 Elevation  - 1 x daily - 7 x weekly - 1 sets - 2 reps - 20 sec hold  Added : Red band ER/IR Myrene Galas, PTA 12/08/21 3:15 PM    ASSESSMENT:   CLINICAL IMPRESSION:  Pt independent and compliant with initial HEP. Pt continues to play pickleball. Pain only with "certain movements" pt reports. No pain with todays exercises. PTA advised pt to pay attention to the deceleration portion of ER/IR band exercise.   OBJECTIVE IMPAIRMENTS decreased ROM, decreased strength, impaired UE functional use, and pain.    ACTIVITY LIMITATIONS carrying and lifting   PARTICIPATION LIMITATIONS: community activity and pickle ball   PERSONAL FACTORS 1-2 comorbidities: shoulder OA  are also affecting patient's functional outcome.    REHAB POTENTIAL: Good   CLINICAL DECISION MAKING: Stable/uncomplicated   EVALUATION COMPLEXITY: Low     GOALS: Goals reviewed with patient? Yes   SHORT TERM GOALS: Target date: 12/16/2021  (Remove Blue Hyperlink)   Pt will be independent with initial HEP. Baseline: Goal status: Goal met 12/08/21   2.  Pt will increase right shoulder A/ROM to [facility] to allow him to easily lift overhead. Baseline: R shoulder flexion and abduction 125 degrees Goal status: INITIAL     LONG TERM GOALS: Target date: 01/16/2022     Pt will be independent with advanced HEP. Baseline:  Goal status: INITIAL   2.  Pt will increase FOTO to at least 68% to demonstrate improvements in functional  mobility. Baseline: 63% Goal status: INITIAL   3.  Pt will increase right shoulder strength to at least 5/5 to allow him to perform all required tasks without difficulty. Baseline: 5-/5 Goal status: INITIAL   4.  Pt to report  pain no greater than 3/10 during typical daily activities and pickle ball. Baseline:  Goal status: INITIAL       PLAN: PT FREQUENCY: 1-2x/week   PT DURATION: 8 weeks   PLANNED INTERVENTIONS: Therapeutic exercises, Therapeutic activity, Neuromuscular re-education, Balance training, Gait training, Patient/Family education, Self Care, Joint mobilization, Joint manipulation, Aquatic Therapy, Dry Needling, Electrical stimulation, Cryotherapy, Moist heat, Taping, Vasopneumatic device, Ultrasound, Ionotophoresis 66m/ml Dexamethasone, Manual therapy, and Re-evaluation   PLAN FOR NEXT SESSION: assess and progress HEP, scapular stability, flexibility, strengthening     Lylia Karn, PTA 12/08/2021, 3:15 PM

## 2021-12-17 ENCOUNTER — Encounter: Payer: No Typology Code available for payment source | Admitting: Rehabilitative and Restorative Service Providers"

## 2021-12-18 ENCOUNTER — Encounter: Payer: Self-pay | Admitting: Rehabilitative and Restorative Service Providers"

## 2021-12-18 ENCOUNTER — Ambulatory Visit: Payer: No Typology Code available for payment source | Admitting: Rehabilitative and Restorative Service Providers"

## 2021-12-18 DIAGNOSIS — M6281 Muscle weakness (generalized): Secondary | ICD-10-CM | POA: Diagnosis not present

## 2021-12-18 DIAGNOSIS — R252 Cramp and spasm: Secondary | ICD-10-CM

## 2021-12-18 DIAGNOSIS — G8929 Other chronic pain: Secondary | ICD-10-CM

## 2021-12-18 NOTE — Therapy (Signed)
OUTPATIENT PHYSICAL THERAPY TREATMENT NOTE   Patient Name: John Spencer MRN: [id number] DOB:1950/05/10, 71 y.o., male Today's Date: 12/18/2021  PCP: Marda Stalker, PA-C REFERRING PROVIDER: Inez Catalina, MD     END OF SESSION:   PT End of Session - 12/18/21 0756     Visit Number 3    Date for PT Re-Evaluation 01/16/22    Authorization Type [employer]    PT Start Time [phone]    PT Stop Time 0825    PT Time Calculation (min) 38 min    Activity Tolerance Patient tolerated treatment well    Behavior During Therapy [facility] for tasks assessed/performed             Past Medical History:  Diagnosis Date   At average risk for colon cancer    BPH (benign prostatic hyperplasia)    Cancer ([place])    Coronary artery disease    Prior PCI x4, per patient.   Depression    Diabetes mellitus without complication ([place])    Heart attack ([place])    Internal carotid artery stenosis, left    Pneumonia    Past Surgical History:  Procedure Laterality Date   APPENDECTOMY     XI ROBOTIC ASSISTED SIMPLE PROSTATECTOMY N/A 05/09/2021   Procedure: XI ROBOTIC ASSISTED SIMPLE PROSTATECTOMY;  Surgeon: Alexis Frock, MD;  Location: [facility];  Service: Urology;  Laterality: N/A;   Patient Active Problem List   Diagnosis Date Noted   Ileus, postoperative ([place]) 05/12/2021   BPH (benign prostatic hyperplasia) 05/09/2021    REFERRING DIAG: M25.511 (ICD-10-CM) - Pain in right shoulder     THERAPY DIAG:  Muscle weakness (generalized)  Cramp and spasm  Chronic right shoulder pain  Rationale for Evaluation and Treatment Rehabilitation  PERTINENT HISTORY: CAD, Prostate Cancer, BPH    PRECAUTIONS: None  SUBJECTIVE: Pt reports that overall, he is feeling 75% better since initial evaluation.  Pt states that he has been able to start playing some pickle ball.  PAIN:  Are you having pain? Yes: NPRS scale: 0-3/10 Pain location: right shoulder Pain description: soreness Aggravating factors:  certain movements Relieving factors: rest and stretches   OBJECTIVE: (objective measures completed at initial evaluation unless otherwise dated)  DIAGNOSTIC FINDINGS:  Pt reports Dr Sheppard Coil has done imaging that revealed shoulder OA   PATIENT SURVEYS:  11/25/2021:  FOTO 63% (projected 68% by visit 9) 12/18/2021:  FOTO 80%   COGNITION:           Overall cognitive status: Within functional limits for tasks assessed                                  SENSATION: WFL   POSTURE: [facility]   UPPER EXTREMITY ROM:    Active ROM Right eval Left eval Right 12/08/21 Right 12/18/2021  Shoulder flexion 125 145 135 145  Shoulder extension 125 136  145  Shoulder abduction        Shoulder adduction        Shoulder internal rotation        Shoulder external rotation        Elbow flexion        Elbow extension        Wrist flexion        Wrist extension        Wrist ulnar deviation        Wrist radial deviation  Wrist pronation        Wrist supination        (Blank rows = not tested)   UPPER EXTREMITY MMT: R shoulder strength of 5-/5, L shoulder strength of 5/5 R grip strength 85#, L grip strength 85#     SHOULDER SPECIAL TESTS:            11/25/2021: Impingement tests: Hawkins/Kennedy impingement test: positive on right            Rotator cuff assessment: Empty can test: negative             PALPATION:  Denies tenderness to palpation             TODAY'S TREATMENT:  12/18/2021: UBE 1.2 x3 min each direction with PT present to discuss status Wall wash for shoulder flexion and abduction x20 reps Lat Pull 40# 2x10 Triceps pressdown 25# 2x10 Rows 24# 2x10 Alphabet on wall with red plyoball with RUE Shoulder ER and horizontal abduction with green tband 2x10 Prone on mat:  shoulder flexion, rows, shoulder extension.  4# RUE 2x10 bilat Push ups on barre 2x10    12/08/21: Wall washes: flex/abd 10x each Doorway pec stretch 10-20 sec hold 3x: Counter top stretch: 20 sec 2x Red  band overhead with stretch 10x Red band ER/IR 10x each added to HEp  Small soft ball alphabet on wall  Wall pushups Arm bike: L1 3x3 wit hPT Apresent to monitor.  11/25/2021: Wall wash for shoulder flexion and abduction x10 reps Shoulder flexion serratus activation with red tband x10 Counter stretch x20 sec Doorway pec stretch x20 sec     PATIENT EDUCATION: Education details: Issued HEP and provided with red tband Person educated: Patient Education method: Explanation, Demonstration, and Handouts Education comprehension: verbalized understanding and returned demonstration     HOME EXERCISE PROGRAM: Access Code: [email or web address] URL: [email or web address] Date: 11/25/2021 Prepared by: Shelby Dubin Kye Silverstein   Exercises - Shoulder Flexion Wall Slide with Towel  - 1 x daily - 7 x weekly - 2 sets - 10 reps - Standing Shoulder Abduction Slides at Wall  - 1 x daily - 7 x weekly - 2 sets - 10 reps - Shoulder Flexion Serratus Activation with Resistance  - 1 x daily - 7 x weekly - 2 sets - 10 reps - Standing 'L' Stretch at Counter  - 1 x daily - 7 x weekly - 1 sets - 2 reps - 20 sec hold - Doorway Pec Stretch at 60 Elevation  - 1 x daily - 7 x weekly - 1 sets - 2 reps - 20 sec hold  Added : Red band ER/IR Myrene Galas, PTA 12/18/21 8:37 AM    ASSESSMENT:   CLINICAL IMPRESSION: [name] is progressing towards goal related activities.  Pt has been able to play pickleball with minimal pain intermittently.  Pt has improved FOTO and A/ROM.  Pt does still report some difficulty with lifting heavy objects overhead and some pain at times with certain bed mobility tasks.  Pt able to progress with strengthening exercises during session today and will benefit from updated HEP next session if no pain following this session.  OBJECTIVE IMPAIRMENTS decreased ROM, decreased strength, impaired UE functional use, and pain.    ACTIVITY LIMITATIONS carrying and lifting   PARTICIPATION LIMITATIONS:  community activity and pickle ball   PERSONAL FACTORS 1-2 comorbidities: shoulder OA  are also affecting patient's functional outcome.    REHAB POTENTIAL: Good   CLINICAL DECISION MAKING: Stable/uncomplicated  EVALUATION COMPLEXITY: Low     GOALS: Goals reviewed with patient? Yes   SHORT TERM GOALS: Target date: 12/16/2021    Pt will be independent with initial HEP. Baseline: Goal status: Goal met 12/08/21   2.  Pt will increase right shoulder A/ROM to [facility] to allow him to easily lift overhead. Baseline: R shoulder flexion and abduction 125 degrees Goal status: MET on 12/18/2021     LONG TERM GOALS: Target date: 01/16/2022     Pt will be independent with advanced HEP. Baseline:  Goal status: ONGOING   2.  Pt will increase FOTO to at least 68% to demonstrate improvements in functional mobility. Baseline: 63% Goal status: MET on 12/18/21   3.  Pt will increase right shoulder strength to at least 5/5 to allow him to perform all required tasks without difficulty. Baseline: 5-/5 Goal status: ONGOING   4.  Pt to report pain no greater than 3/10 during typical daily activities and pickle ball. Baseline:  Goal status: ONGOING       PLAN: PT FREQUENCY: 1-2x/week   PT DURATION: 8 weeks   PLANNED INTERVENTIONS: Therapeutic exercises, Therapeutic activity, Neuromuscular re-education, Balance training, Gait training, Patient/Family education, Self Care, Joint mobilization, Joint manipulation, Aquatic Therapy, Dry Needling, Electrical stimulation, Cryotherapy, Moist heat, Taping, Vasopneumatic device, Ultrasound, Ionotophoresis 75m/ml Dexamethasone, Manual therapy, and Re-evaluation   PLAN FOR NEXT SESSION: assess and progress HEP, scapular stability, flexibility, strengthening     SJuel Burrow PT 12/18/2021, 8:37 AM  [facility] [place]illage [place]asons[place]al[place] [address]Phone # [phone] [phone]

## 2021-12-22 ENCOUNTER — Ambulatory Visit: Payer: No Typology Code available for payment source | Admitting: Physical Therapy

## 2021-12-22 ENCOUNTER — Encounter: Payer: Self-pay | Admitting: Physical Therapy

## 2021-12-22 DIAGNOSIS — H25811 Combined forms of age-related cataract, right eye: Secondary | ICD-10-CM | POA: Diagnosis not present

## 2021-12-22 DIAGNOSIS — Z961 Presence of intraocular lens: Secondary | ICD-10-CM | POA: Diagnosis not present

## 2021-12-22 DIAGNOSIS — M6281 Muscle weakness (generalized): Secondary | ICD-10-CM | POA: Diagnosis not present

## 2021-12-22 DIAGNOSIS — G8929 Other chronic pain: Secondary | ICD-10-CM

## 2021-12-22 DIAGNOSIS — D3132 Benign neoplasm of left choroid: Secondary | ICD-10-CM | POA: Diagnosis not present

## 2021-12-22 DIAGNOSIS — R252 Cramp and spasm: Secondary | ICD-10-CM

## 2021-12-22 DIAGNOSIS — H35362 Drusen (degenerative) of macula, left eye: Secondary | ICD-10-CM | POA: Diagnosis not present

## 2021-12-22 NOTE — Therapy (Signed)
OUTPATIENT PHYSICAL THERAPY TREATMENT NOTE   Patient Name: John Spencer MRN: [id number] DOB:09/03/1950, 71 y.o., male Today's Date: 12/22/2021  PCP: Marda Stalker, PA-C REFERRING PROVIDER: Inez Catalina, MD     END OF SESSION:   PT End of Session - 12/22/21 1400     Visit Number 4    Date for PT Re-Evaluation 01/16/22    Authorization Type [employer]    PT Start Time 1400    PT Stop Time 4081    PT Time Calculation (min) 39 min    Activity Tolerance Patient tolerated treatment well    Behavior During Therapy WFL for tasks assessed/performed              Past Medical History:  Diagnosis Date   At average risk for colon cancer    BPH (benign prostatic hyperplasia)    Cancer ([place])    Coronary artery disease    Prior PCI x4, per patient.   Depression    Diabetes mellitus without complication ([place])    Heart attack ([place])    Internal carotid artery stenosis, left    Pneumonia    Past Surgical History:  Procedure Laterality Date   APPENDECTOMY     XI ROBOTIC ASSISTED SIMPLE PROSTATECTOMY N/A 05/09/2021   Procedure: XI ROBOTIC ASSISTED SIMPLE PROSTATECTOMY;  Surgeon: Alexis Frock, MD;  [facility];  Service: Urology;  Laterality: N/A;   Patient Active Problem List   Diagnosis Date Noted   Ileus, postoperative ([place]) 05/12/2021   BPH (benign prostatic hyperplasia) 05/09/2021    REFERRING DIAG: M25.511 (ICD-10-CM) - Pain in right shoulder     THERAPY DIAG:  Muscle weakness (generalized)  Cramp and spasm  Chronic right shoulder pain  Rationale for Evaluation and Treatment Rehabilitation  PERTINENT HISTORY: CAD, Prostate Cancer, BPH    PRECAUTIONS: None  SUBJECTIVE: Pt caught a women who passed out yesterday, at first he was worried about his shoulders but today he is fine and has no pain. Played pickleball this AM and did well.  PAIN:  Are you having pain? Yes: NPRS scale: 0-3/10 Pain location: right shoulder Pain description:  soreness Aggravating factors: certain movements Relieving factors: rest and stretches   OBJECTIVE: (objective measures completed at initial evaluation unless otherwise dated)  DIAGNOSTIC FINDINGS:  Pt reports Dr Sheppard Coil has done imaging that revealed shoulder OA   PATIENT SURVEYS:  11/25/2021:  FOTO 63% (projected 68% by visit 9) 12/18/2021:  FOTO 80%   COGNITION:           Overall cognitive status: Within functional limits for tasks assessed                                  SENSATION: WFL   POSTURE: [facility]   UPPER EXTREMITY ROM:    Active ROM Right eval Left eval Right 12/08/21 Right 12/18/2021  Shoulder flexion 125 145 135 145  Shoulder extension 125 136  145  Shoulder abduction        Shoulder adduction        Shoulder internal rotation        Shoulder external rotation        Elbow flexion        Elbow extension        Wrist flexion        Wrist extension        Wrist ulnar deviation        Wrist radial  deviation        Wrist pronation        Wrist supination        (Blank rows = not tested)   UPPER EXTREMITY MMT: R shoulder strength of 5-/5, L shoulder strength of 5/5 R grip strength 85#, L grip strength 85#     SHOULDER SPECIAL TESTS:            11/25/2021: Impingement tests: Hawkins/Kennedy impingement test: positive on right            Rotator cuff assessment: Empty can test: negative             PALPATION:  Denies tenderness to palpation             TODAY'S TREATMENT:    12/22/21: Arm bike L1.5 3x3 with PTA present to discuss status. Lat pull 40# 2x10 Tricep push downs 25# 2x10 Seated row: 25# 2x10 Standing horizontal abd blue band 2x10, Ext rotation 2x10 Green loop 3:00/9:00 10x2, then diagonals 10x  Alphabet on wall red ball Push ups on barre 2x10 Bent over rows 5# 2x10, extensions 5# 10x Bil   12/18/2021: UBE 1.2 x3 min each direction with PT present to discuss status Wall wash for shoulder flexion and abduction x20 reps Lat Pull 40#  2x10 Triceps pressdown 25# 2x10 Rows 24# 2x10 Alphabet on wall with red plyoball with RUE Shoulder ER and horizontal abduction with green tband 2x10 Prone on mat:  shoulder flexion, rows, shoulder extension.  4# RUE 2x10 bilat Push ups on barre 2x10    12/08/21: Wall washes: flex/abd 10x each Doorway pec stretch 10-20 sec hold 3x: Counter top stretch: 20 sec 2x Red band overhead with stretch 10x Red band ER/IR 10x each added to HEp  Small soft ball alphabet on wall  Wall pushups Arm bike: L1 3x3 wit hPT Apresent to monitor.    PATIENT EDUCATION: Education details: Issued HEP and provided with red tband Person educated: Patient Education method: Explanation, Demonstration, and Handouts Education comprehension: verbalized understanding and returned demonstration     HOME EXERCISE PROGRAM: Access Code: [email or web address] URL: [email or web address] Date: 11/25/2021 Prepared by: Shelby Dubin Menke   Exercises - Shoulder Flexion Wall Slide with Towel  - 1 x daily - 7 x weekly - 2 sets - 10 reps - Standing Shoulder Abduction Slides at Wall  - 1 x daily - 7 x weekly - 2 sets - 10 reps - Shoulder Flexion Serratus Activation with Resistance  - 1 x daily - 7 x weekly - 2 sets - 10 reps - Standing 'L' Stretch at Counter  - 1 x daily - 7 x weekly - 1 sets - 2 reps - 20 sec hold - Doorway Pec Stretch at 60 Elevation  - 1 x daily - 7 x weekly - 1 sets - 2 reps - 20 sec hold  Added : Red band ER/IR Myrene Galas, PTA 12/22/21 2:41 PM    ASSESSMENT:   CLINICAL IMPRESSION: Pickleball continues to do well, no pain playing or post. Increased aspects of programs today: load/reps and pt did very well with all. Shoulder AROM via eyeball looks WNL today.  OBJECTIVE IMPAIRMENTS decreased ROM, decreased strength, impaired UE functional use, and pain.    ACTIVITY LIMITATIONS carrying and lifting   PARTICIPATION LIMITATIONS: community activity and pickle ball   PERSONAL FACTORS 1-2  comorbidities: shoulder OA  are also affecting patient's functional outcome.    REHAB POTENTIAL: Good   CLINICAL DECISION MAKING: Stable/uncomplicated   EVALUATION  COMPLEXITY: Low     GOALS: Goals reviewed with patient? Yes   SHORT TERM GOALS: Target date: 12/16/2021    Pt will be independent with initial HEP. Baseline: Goal status: Goal met 12/08/21   2.  Pt will increase right shoulder A/ROM to [facility] to allow him to easily lift overhead. Baseline: R shoulder flexion and abduction 125 degrees Goal status: MET on 12/18/2021     LONG TERM GOALS: Target date: 01/16/2022     Pt will be independent with advanced HEP. Baseline:  Goal status: ONGOING   2.  Pt will increase FOTO to at least 68% to demonstrate improvements in functional mobility. Baseline: 63% Goal status: MET on 12/18/21   3.  Pt will increase right shoulder strength to at least 5/5 to allow him to perform all required tasks without difficulty. Baseline: 5-/5 Goal status: ONGOING   4.  Pt to report pain no greater than 3/10 during typical daily activities and pickle ball. Baseline:  Goal status: ONGOING       PLAN: PT FREQUENCY: 1-2x/week   PT DURATION: 8 weeks   PLANNED INTERVENTIONS: Therapeutic exercises, Therapeutic activity, Neuromuscular re-education, Balance training, Gait training, Patient/Family education, Self Care, Joint mobilization, Joint manipulation, Aquatic Therapy, Dry Needling, Electrical stimulation, Cryotherapy, Moist heat, Taping, Vasopneumatic device, Ultrasound, Ionotophoresis 53m/ml Dexamethasone, Manual therapy, and Re-evaluation   PLAN FOR NEXT SESSION: assess and progress HEP, scapular stability, flexibility, strengthening     Chelcie Estorga, PTA 12/22/2021, 2:41 PM  [employer] SSpa[place]ding[place] [address]Phone # [phone] [phone]

## 2021-12-25 DIAGNOSIS — Z6822 Body mass index (BMI) 22.0-22.9, adult: Secondary | ICD-10-CM | POA: Diagnosis not present

## 2021-12-25 DIAGNOSIS — Z03818 Encounter for observation for suspected exposure to other biological agents ruled out: Secondary | ICD-10-CM | POA: Diagnosis not present

## 2021-12-25 DIAGNOSIS — Z20822 Contact with and (suspected) exposure to covid-19: Secondary | ICD-10-CM | POA: Diagnosis not present

## 2021-12-30 ENCOUNTER — Ambulatory Visit
Payer: No Typology Code available for payment source | Attending: Sports Medicine | Admitting: Rehabilitative and Restorative Service Providers"

## 2021-12-30 ENCOUNTER — Encounter: Payer: Self-pay | Admitting: Rehabilitative and Restorative Service Providers"

## 2021-12-30 DIAGNOSIS — M6281 Muscle weakness (generalized): Secondary | ICD-10-CM | POA: Diagnosis not present

## 2021-12-30 DIAGNOSIS — R252 Cramp and spasm: Secondary | ICD-10-CM | POA: Diagnosis present

## 2021-12-30 DIAGNOSIS — M25511 Pain in right shoulder: Secondary | ICD-10-CM | POA: Diagnosis not present

## 2021-12-30 DIAGNOSIS — G8929 Other chronic pain: Secondary | ICD-10-CM | POA: Diagnosis not present

## 2021-12-30 NOTE — Therapy (Signed)
OUTPATIENT PHYSICAL THERAPY TREATMENT NOTE AND DISCHARGE SUMMARY   Patient Name: John Spencer MRN: [id number] DOB:1950/06/11, 71 y.o., male Today's Date: 12/30/2021  PCP: Marda Stalker, PA-C REFERRING PROVIDER: Inez Catalina, MD     END OF SESSION:   PT End of Session - 12/30/21 1402     Visit Number 5    Date for PT Re-Evaluation 01/16/22    Authorization Type [employer]    PT Start Time 1400    PT Stop Time 1430    PT Time Calculation (min) 30 min    Activity Tolerance Patient tolerated treatment well    Behavior During Therapy WFL for tasks assessed/performed              Past Medical History:  Diagnosis Date   At average risk for colon cancer    BPH (benign prostatic hyperplasia)    Cancer ([place])    Coronary artery disease    Prior PCI x4, per patient.   Depression    Diabetes mellitus without complication ([place])    Heart attack ([place])    Internal carotid artery stenosis, left    Pneumonia    Past Surgical History:  Procedure Laterality Date   APPENDECTOMY     XI ROBOTIC ASSISTED SIMPLE PROSTATECTOMY N/A 05/09/2021   Procedure: XI ROBOTIC ASSISTED SIMPLE PROSTATECTOMY;  Surgeon: Alexis Frock, MD;  [facility];  Service: Urology;  Laterality: N/A;   Patient Active Problem List   Diagnosis Date Noted   Ileus, postoperative ([place]) 05/12/2021   BPH (benign prostatic hyperplasia) 05/09/2021    REFERRING DIAG: M25.511 (ICD-10-CM) - Pain in right shoulder     THERAPY DIAG:  Muscle weakness (generalized)  Cramp and spasm  Chronic right shoulder pain  Rationale for Evaluation and Treatment Rehabilitation  PERTINENT HISTORY: CAD, Prostate Cancer, BPH    PRECAUTIONS: None  SUBJECTIVE: Pt reports that generally, he does not have pain, but occasionally has a little "twinge".  Reports that he is playing pickleball as normally now.  PAIN:  Are you having pain? Yes: NPRS scale: 0-1/10 Pain location: right shoulder Pain description:  soreness Aggravating factors: certain movements Relieving factors: rest and stretches   OBJECTIVE: (objective measures completed at initial evaluation unless otherwise dated)  DIAGNOSTIC FINDINGS:  Pt reports Dr Sheppard Coil has done imaging that revealed shoulder OA   PATIENT SURVEYS:  11/25/2021:  FOTO 63% (projected 68% by visit 9) 12/18/2021:  FOTO 80%   COGNITION:           Overall cognitive status: Within functional limits for tasks assessed                                  SENSATION: WFL   POSTURE: [facility]   UPPER EXTREMITY ROM:    Active ROM Right eval Left eval Right 12/08/21 Right 12/18/2021  Shoulder flexion 125 145 135 145  Shoulder extension 125 136  145  Shoulder abduction        Shoulder adduction        Shoulder internal rotation        Shoulder external rotation        Elbow flexion        Elbow extension        Wrist flexion        Wrist extension        Wrist ulnar deviation        Wrist radial deviation  Wrist pronation        Wrist supination        (Blank rows = not tested)   UPPER EXTREMITY MMT: Eval: R shoulder strength of 5-/5, L shoulder strength of 5/5 R grip strength 85#, L grip strength 85#  12/30/2021: Bilat shoulder strength of 5/5 throughout without pain     SHOULDER SPECIAL TESTS:            11/25/2021: Impingement tests: Hawkins/Kennedy impingement test: positive on right            Rotator cuff assessment: Empty can test: negative  12/30/2021: Impingement tests: Hawkins/Kennedy impingement test: NEGATIVE             PALPATION:  Denies tenderness to palpation             TODAY'S TREATMENT:    12/30/2021: UBE 2.0 x3 min each direction with PT present to discuss status Wall wash for shoulder flexion and abduction with 2# x20 reps Wall push ups 2x10 Lat pull 45# 2x10 Seated row: 25# x10, 30# x10 Triceps pressdown 25# 2x10 Bent over rows 5# 2x10, extensions 5# 2x10 Bil Shoulder ER and horizontal abduction with blue tband  2x10 3 way scapular retraction with yellow loop x10 bilat   12/22/21: Arm bike L1.5 3x3 with PTA present to discuss status. Lat pull 40# 2x10 Tricep push downs 25# 2x10 Seated row: 25# 2x10 Standing horizontal abd blue band 2x10, Ext rotation 2x10 Green loop 3:00/9:00 10x2, then diagonals 10x  Alphabet on wall red ball Push ups on barre 2x10 Bent over rows 5# 2x10, extensions 5# 10x Bil   12/18/2021: UBE 1.2 x3 min each direction with PT present to discuss status Wall wash for shoulder flexion and abduction x20 reps Lat Pull 40# 2x10 Triceps pressdown 25# 2x10 Rows 24# 2x10 Alphabet on wall with red plyoball with RUE Shoulder ER and horizontal abduction with green tband 2x10 Prone on mat:  shoulder flexion, rows, shoulder extension.  4# RUE 2x10 bilat Push ups on barre 2x10      PATIENT EDUCATION: Education details: Issued HEP and provided with red tband Person educated: Patient Education method: Explanation, Demonstration, and Handouts Education comprehension: verbalized understanding and returned demonstration     HOME EXERCISE PROGRAM: Access Code: [email or web address] URL: [email or web address] Date: 11/25/2021 Prepared by: Shelby Dubin Tirth [place]   Exercises - Shoulder Flexion Wall Slide with Towel  - 1 x daily - 7 x weekly - 2 sets - 10 reps - Standing Shoulder Abduction Slides at Wall  - 1 x daily - 7 x weekly - 2 sets - 10 reps - Shoulder Flexion Serratus Activation with Resistance  - 1 x daily - 7 x weekly - 2 sets - 10 reps - Standing 'L' Stretch at Counter  - 1 x daily - 7 x weekly - 1 sets - 2 reps - 20 sec hold - Doorway Pec Stretch at 60 Elevation  - 1 x daily - 7 x weekly - 1 sets - 2 reps - 20 sec hold  Added : Red band ER/IR Myrene Galas, PTA    ASSESSMENT:   CLINICAL IMPRESSION: Mr Tullis presents to skilled PT with continued reports of feeling better and and denies pain with Pickleball.  Pt has been incorporating strengthening into gym routine and  is able to perform without increased pain.  Pt has met all outpatient PT goals and is independent with his HEP and going to the gym.  Pt discharged at this time to continue with HEP  and playing pickleball and following up with MD as needed.  OBJECTIVE IMPAIRMENTS decreased ROM, decreased strength, impaired UE functional use, and pain.    ACTIVITY LIMITATIONS carrying and lifting   PARTICIPATION LIMITATIONS: community activity and pickle ball   PERSONAL FACTORS 1-2 comorbidities: shoulder OA  are also affecting patient's functional outcome.    REHAB POTENTIAL: Good   CLINICAL DECISION MAKING: Stable/uncomplicated   EVALUATION COMPLEXITY: Low     GOALS: Goals reviewed with patient? Yes   SHORT TERM GOALS: Target date: 12/16/2021    Pt will be independent with initial HEP. Baseline: Goal status: Goal met 12/08/21   2.  Pt will increase right shoulder A/ROM to [facility] to allow him to easily lift overhead. Baseline: R shoulder flexion and abduction 125 degrees Goal status: MET on 12/18/2021     LONG TERM GOALS: Target date: 01/16/2022     Pt will be independent with advanced HEP. Baseline:  Goal status: MET   2.  Pt will increase FOTO to at least 68% to demonstrate improvements in functional mobility. Baseline: 63% Goal status: MET on 12/18/21   3.  Pt will increase right shoulder strength to at least 5/5 to allow him to perform all required tasks without difficulty. Baseline: 5-/5 Goal status: MET on 12/30/2021   4.  Pt to report pain no greater than 3/10 during typical daily activities and pickle ball. Baseline:  Goal status: MET on 12/30/2021       PLAN: PT FREQUENCY: 1-2x/week   PT DURATION: 8 weeks   PLANNED INTERVENTIONS: Therapeutic exercises, Therapeutic activity, Neuromuscular re-education, Balance training, Gait training, Patient/Family education, Self Care, Joint mobilization, Joint manipulation, Aquatic Therapy, Dry Needling, Electrical stimulation, Cryotherapy,  Moist heat, Taping, Vasopneumatic device, Ultrasound, Ionotophoresis 61m/ml Dexamethasone, Manual therapy, and Re-evaluation   PLAN FOR NEXT SESSION: Discharge completed on 12/30/2021    PHYSICAL THERAPY DISCHARGE SUMMARY   Patient agrees to discharge. Patient goals were met. Patient is being discharged due to meeting the stated rehab goals.      SJuel Burrow PT 12/30/2021, 2:49 PM  BR[facility] Institute[address] SSalmon CreekGLeota [place] [address]Phone # [phone] [phone]

## 2022-01-12 DIAGNOSIS — Z6823 Body mass index (BMI) 23.0-23.9, adult: Secondary | ICD-10-CM | POA: Diagnosis not present

## 2022-01-12 DIAGNOSIS — N189 Chronic kidney disease, unspecified: Secondary | ICD-10-CM | POA: Diagnosis not present

## 2022-01-12 DIAGNOSIS — I700 Atherosclerosis of aorta: Secondary | ICD-10-CM | POA: Diagnosis not present

## 2022-01-12 DIAGNOSIS — Z008 Encounter for other general examination: Secondary | ICD-10-CM | POA: Diagnosis not present

## 2022-01-12 DIAGNOSIS — R7303 Prediabetes: Secondary | ICD-10-CM | POA: Diagnosis not present

## 2022-01-12 DIAGNOSIS — I77819 Aortic ectasia, unspecified site: Secondary | ICD-10-CM | POA: Diagnosis not present

## 2022-01-12 DIAGNOSIS — E785 Hyperlipidemia, unspecified: Secondary | ICD-10-CM | POA: Diagnosis not present

## 2022-01-12 DIAGNOSIS — F17211 Nicotine dependence, cigarettes, in remission: Secondary | ICD-10-CM | POA: Diagnosis not present

## 2022-01-13 ENCOUNTER — Encounter: Payer: No Typology Code available for payment source | Admitting: Rehabilitative and Restorative Service Providers"

## 2022-01-28 DIAGNOSIS — I2510 Atherosclerotic heart disease of native coronary artery without angina pectoris: Secondary | ICD-10-CM | POA: Diagnosis not present

## 2022-01-28 DIAGNOSIS — Z6823 Body mass index (BMI) 23.0-23.9, adult: Secondary | ICD-10-CM | POA: Diagnosis not present

## 2022-01-28 DIAGNOSIS — I10 Essential (primary) hypertension: Secondary | ICD-10-CM | POA: Diagnosis not present

## 2022-01-28 DIAGNOSIS — Z8546 Personal history of malignant neoplasm of prostate: Secondary | ICD-10-CM | POA: Diagnosis not present

## 2022-01-28 DIAGNOSIS — E7800 Pure hypercholesterolemia, unspecified: Secondary | ICD-10-CM | POA: Diagnosis not present

## 2022-01-28 DIAGNOSIS — N401 Enlarged prostate with lower urinary tract symptoms: Secondary | ICD-10-CM | POA: Diagnosis not present

## 2022-02-02 DIAGNOSIS — E7800 Pure hypercholesterolemia, unspecified: Secondary | ICD-10-CM | POA: Diagnosis not present

## 2022-02-18 DIAGNOSIS — Z6823 Body mass index (BMI) 23.0-23.9, adult: Secondary | ICD-10-CM | POA: Diagnosis not present

## 2022-02-18 DIAGNOSIS — Z0000 Encounter for general adult medical examination without abnormal findings: Secondary | ICD-10-CM | POA: Diagnosis not present

## 2022-02-18 DIAGNOSIS — R82998 Other abnormal findings in urine: Secondary | ICD-10-CM | POA: Diagnosis not present

## 2022-02-18 DIAGNOSIS — R413 Other amnesia: Secondary | ICD-10-CM | POA: Diagnosis not present

## 2022-03-26 DIAGNOSIS — Z03818 Encounter for observation for suspected exposure to other biological agents ruled out: Secondary | ICD-10-CM | POA: Diagnosis not present

## 2022-03-26 DIAGNOSIS — R059 Cough, unspecified: Secondary | ICD-10-CM | POA: Diagnosis not present

## 2022-03-26 DIAGNOSIS — R6883 Chills (without fever): Secondary | ICD-10-CM | POA: Diagnosis not present

## 2022-03-26 DIAGNOSIS — R5383 Other fatigue: Secondary | ICD-10-CM | POA: Diagnosis not present

## 2022-03-26 DIAGNOSIS — J029 Acute pharyngitis, unspecified: Secondary | ICD-10-CM | POA: Diagnosis not present

## 2022-03-26 DIAGNOSIS — R52 Pain, unspecified: Secondary | ICD-10-CM | POA: Diagnosis not present

## 2022-05-22 DIAGNOSIS — R059 Cough, unspecified: Secondary | ICD-10-CM | POA: Diagnosis not present

## 2022-05-22 DIAGNOSIS — U071 COVID-19: Secondary | ICD-10-CM | POA: Diagnosis not present

## 2022-05-22 DIAGNOSIS — Z6823 Body mass index (BMI) 23.0-23.9, adult: Secondary | ICD-10-CM | POA: Diagnosis not present

## 2022-05-27 DIAGNOSIS — Z6823 Body mass index (BMI) 23.0-23.9, adult: Secondary | ICD-10-CM | POA: Diagnosis not present

## 2022-05-27 DIAGNOSIS — U071 COVID-19: Secondary | ICD-10-CM | POA: Diagnosis not present

## 2022-06-15 DIAGNOSIS — H524 Presbyopia: Secondary | ICD-10-CM | POA: Diagnosis not present

## 2022-08-03 DIAGNOSIS — I10 Essential (primary) hypertension: Secondary | ICD-10-CM | POA: Diagnosis not present

## 2022-08-03 DIAGNOSIS — Z6822 Body mass index (BMI) 22.0-22.9, adult: Secondary | ICD-10-CM | POA: Diagnosis not present

## 2022-08-03 DIAGNOSIS — I2510 Atherosclerotic heart disease of native coronary artery without angina pectoris: Secondary | ICD-10-CM | POA: Diagnosis not present

## 2022-08-03 DIAGNOSIS — E7800 Pure hypercholesterolemia, unspecified: Secondary | ICD-10-CM | POA: Diagnosis not present

## 2022-09-16 DIAGNOSIS — I951 Orthostatic hypotension: Secondary | ICD-10-CM | POA: Diagnosis not present

## 2022-09-16 DIAGNOSIS — Z6822 Body mass index (BMI) 22.0-22.9, adult: Secondary | ICD-10-CM | POA: Diagnosis not present

## 2022-09-16 DIAGNOSIS — R42 Dizziness and giddiness: Secondary | ICD-10-CM | POA: Diagnosis not present

## 2022-09-22 DIAGNOSIS — Z0000 Encounter for general adult medical examination without abnormal findings: Secondary | ICD-10-CM | POA: Diagnosis not present

## 2022-09-22 DIAGNOSIS — I10 Essential (primary) hypertension: Secondary | ICD-10-CM | POA: Diagnosis not present

## 2022-09-22 DIAGNOSIS — H6122 Impacted cerumen, left ear: Secondary | ICD-10-CM | POA: Diagnosis not present

## 2022-09-22 DIAGNOSIS — Z6822 Body mass index (BMI) 22.0-22.9, adult: Secondary | ICD-10-CM | POA: Diagnosis not present

## 2022-09-22 DIAGNOSIS — H8110 Benign paroxysmal vertigo, unspecified ear: Secondary | ICD-10-CM | POA: Diagnosis not present

## 2022-09-22 DIAGNOSIS — R42 Dizziness and giddiness: Secondary | ICD-10-CM | POA: Diagnosis not present

## 2022-10-19 DIAGNOSIS — I10 Essential (primary) hypertension: Secondary | ICD-10-CM | POA: Diagnosis not present

## 2022-10-19 DIAGNOSIS — H8110 Benign paroxysmal vertigo, unspecified ear: Secondary | ICD-10-CM | POA: Diagnosis not present

## 2022-10-19 DIAGNOSIS — Z6822 Body mass index (BMI) 22.0-22.9, adult: Secondary | ICD-10-CM | POA: Diagnosis not present

## 2022-10-20 ENCOUNTER — Other Ambulatory Visit: Payer: Self-pay | Admitting: Family Medicine

## 2022-10-20 DIAGNOSIS — R42 Dizziness and giddiness: Secondary | ICD-10-CM

## 2022-10-29 DIAGNOSIS — R42 Dizziness and giddiness: Secondary | ICD-10-CM | POA: Diagnosis not present

## 2022-10-29 DIAGNOSIS — R5383 Other fatigue: Secondary | ICD-10-CM | POA: Diagnosis not present

## 2022-11-03 ENCOUNTER — Other Ambulatory Visit: Payer: No Typology Code available for payment source

## 2022-11-10 DIAGNOSIS — R109 Unspecified abdominal pain: Secondary | ICD-10-CM | POA: Diagnosis not present

## 2022-11-10 DIAGNOSIS — L80 Vitiligo: Secondary | ICD-10-CM | POA: Diagnosis not present

## 2022-11-10 DIAGNOSIS — Z789 Other specified health status: Secondary | ICD-10-CM | POA: Diagnosis not present

## 2022-11-10 DIAGNOSIS — Z8349 Family history of other endocrine, nutritional and metabolic diseases: Secondary | ICD-10-CM | POA: Diagnosis not present

## 2022-11-10 DIAGNOSIS — Z92241 Personal history of systemic steroid therapy: Secondary | ICD-10-CM | POA: Diagnosis not present

## 2022-11-10 DIAGNOSIS — R195 Other fecal abnormalities: Secondary | ICD-10-CM | POA: Diagnosis not present

## 2022-11-10 DIAGNOSIS — R5383 Other fatigue: Secondary | ICD-10-CM | POA: Diagnosis not present

## 2022-11-10 DIAGNOSIS — R110 Nausea: Secondary | ICD-10-CM | POA: Diagnosis not present

## 2022-11-10 DIAGNOSIS — R42 Dizziness and giddiness: Secondary | ICD-10-CM | POA: Diagnosis not present

## 2022-11-11 ENCOUNTER — Ambulatory Visit
Admission: RE | Admit: 2022-11-11 | Discharge: 2022-11-11 | Disposition: A | Discharge status: Home / Self Care | Payer: No Typology Code available for payment source | Source: Ambulatory Visit | Attending: Family Medicine | Admitting: Family Medicine

## 2022-11-11 DIAGNOSIS — R42 Dizziness and giddiness: Secondary | ICD-10-CM | POA: Diagnosis not present

## 2022-11-12 DIAGNOSIS — Z92241 Personal history of systemic steroid therapy: Secondary | ICD-10-CM | POA: Diagnosis not present

## 2022-11-12 DIAGNOSIS — R42 Dizziness and giddiness: Secondary | ICD-10-CM | POA: Diagnosis not present

## 2022-11-12 DIAGNOSIS — E538 Deficiency of other specified B group vitamins: Secondary | ICD-10-CM | POA: Diagnosis not present

## 2022-11-12 DIAGNOSIS — L80 Vitiligo: Secondary | ICD-10-CM | POA: Diagnosis not present

## 2022-11-17 DIAGNOSIS — Z92241 Personal history of systemic steroid therapy: Secondary | ICD-10-CM | POA: Diagnosis not present

## 2022-11-17 DIAGNOSIS — R42 Dizziness and giddiness: Secondary | ICD-10-CM | POA: Diagnosis not present

## 2022-11-23 DIAGNOSIS — E538 Deficiency of other specified B group vitamins: Secondary | ICD-10-CM | POA: Diagnosis not present

## 2022-11-23 DIAGNOSIS — L80 Vitiligo: Secondary | ICD-10-CM | POA: Diagnosis not present

## 2022-11-23 DIAGNOSIS — R5383 Other fatigue: Secondary | ICD-10-CM | POA: Diagnosis not present

## 2022-11-23 DIAGNOSIS — R058 Other specified cough: Secondary | ICD-10-CM | POA: Diagnosis not present

## 2022-11-23 DIAGNOSIS — R42 Dizziness and giddiness: Secondary | ICD-10-CM | POA: Diagnosis not present

## 2022-11-23 DIAGNOSIS — R0989 Other specified symptoms and signs involving the circulatory and respiratory systems: Secondary | ICD-10-CM | POA: Diagnosis not present

## 2022-11-23 DIAGNOSIS — I7090 Unspecified atherosclerosis: Secondary | ICD-10-CM | POA: Diagnosis not present

## 2022-11-23 DIAGNOSIS — Z789 Other specified health status: Secondary | ICD-10-CM | POA: Diagnosis not present

## 2022-11-23 DIAGNOSIS — J029 Acute pharyngitis, unspecified: Secondary | ICD-10-CM | POA: Diagnosis not present

## 2022-11-24 ENCOUNTER — Other Ambulatory Visit: Payer: Self-pay | Admitting: Internal Medicine

## 2022-11-24 ENCOUNTER — Ambulatory Visit
Admission: RE | Admit: 2022-11-24 | Discharge: 2022-11-24 | Disposition: A | Discharge status: Home / Self Care | Payer: No Typology Code available for payment source | Source: Ambulatory Visit | Attending: Internal Medicine | Admitting: Internal Medicine

## 2022-11-24 DIAGNOSIS — R0989 Other specified symptoms and signs involving the circulatory and respiratory systems: Secondary | ICD-10-CM

## 2022-11-24 DIAGNOSIS — R059 Cough, unspecified: Secondary | ICD-10-CM | POA: Diagnosis not present

## 2022-11-24 DIAGNOSIS — R058 Other specified cough: Secondary | ICD-10-CM

## 2022-12-01 DIAGNOSIS — R5383 Other fatigue: Secondary | ICD-10-CM | POA: Diagnosis not present

## 2022-12-01 DIAGNOSIS — H9209 Otalgia, unspecified ear: Secondary | ICD-10-CM | POA: Diagnosis not present

## 2022-12-01 DIAGNOSIS — J029 Acute pharyngitis, unspecified: Secondary | ICD-10-CM | POA: Diagnosis not present

## 2022-12-01 DIAGNOSIS — R42 Dizziness and giddiness: Secondary | ICD-10-CM | POA: Diagnosis not present

## 2022-12-03 ENCOUNTER — Other Ambulatory Visit (HOSPITAL_COMMUNITY): Payer: Self-pay | Admitting: Radiology

## 2022-12-03 DIAGNOSIS — Z87891 Personal history of nicotine dependence: Secondary | ICD-10-CM

## 2022-12-03 DIAGNOSIS — R9389 Abnormal findings on diagnostic imaging of other specified body structures: Secondary | ICD-10-CM

## 2022-12-07 NOTE — Progress Notes (Unsigned)
Cardiology Office Note:   Date:  12/07/2022  NAME:  John Spencer    MRN: [id number] DOB:  28-Sep-1950   PCP:  Jarrett Soho, PA-C  Cardiologist:  None  Electrophysiologist:  None   Referring MD: Jarrett Soho, PA-C   No chief complaint on file.   History of Present Illness:   John Spencer is a 72 y.o. male with a hx of CAD, sinus bradycardia, diabetes who is being seen today for the evaluation of sinus bradycardia at the request of Jarrett Soho, PA-C.  Problem List CAD -Hx of PCI -negative MPI 08/28/2020 2. Carotid artery disease -negative for plaque 11/13/2022 3. Sinus bradycardia -average HR 61 bpm (zio 09/23/2020) -PVC burden 3.3% 4. HLD -T chol 211, HDL 49, LDL 145, TG 93  Past Medical History: Past Medical History:  Diagnosis Date   At average risk for colon cancer    BPH (benign prostatic hyperplasia)    Cancer (HCC)    Coronary artery disease    Prior PCI x4, per patient.   Depression    Diabetes mellitus without complication (HCC)    Heart attack (HCC)    Internal carotid artery stenosis, left    Pneumonia     Past Surgical History: Past Surgical History:  Procedure Laterality Date   APPENDECTOMY     XI ROBOTIC ASSISTED SIMPLE PROSTATECTOMY N/A 05/09/2021   Procedure: XI ROBOTIC ASSISTED SIMPLE PROSTATECTOMY;  Surgeon: Sebastian Ache, MD;  Location: [facility];  Service: Urology;  Laterality: N/A;    Current Medications: No outpatient medications have been marked as taking for the 12/09/22 encounter (Appointment) with O'Neal, Ronnald Ramp, MD.     Allergies:    Patient has no known allergies.   Social History: Social History   Socioeconomic History   Marital status: Divorced    Spouse name: Not on file   Number of children: 2   Years of education: Not on file   Highest education level: Not on file  Occupational History   Not on file  Tobacco Use   Smoking status: Former    Current packs/day: 0.00    Average packs/day: 1 pack/day for  3.0 years (3.0 ttl pk-yrs)    Types: Cigarettes    Start date: 31    Quit date: 30    Years since quitting: 32.7   Smokeless tobacco: Never  Vaping Use   Vaping status: Never Used  Substance and Sexual Activity   Alcohol use: Never   Drug use: Not Currently    Types: Marijuana   Sexual activity: Not on file  Other Topics Concern   Not on file  Social History Narrative   Not on file   Social Determinants of Health   Financial Resource Strain: Not on file  Food Insecurity: Not on file  Transportation Needs: Not on file  Physical Activity: Not on file  Stress: Not on file  Social Connections: Unknown (08/12/2021)   Received from [facility]   Social Network    Social Network: Not on file     Family History: The patient's family history includes Colon cancer in his sister; Heart disease in his father and mother; Hypertension in his brother, father, and mother.  ROS:   All other ROS reviewed and negative. Pertinent positives noted in the HPI.     EKGs/Labs/Other Studies Reviewed:   The following studies were personally reviewed by me today:  EKG:  EKG is *** ordered today.        Recent Labs:  No results found for requested labs within last 365 days.   Recent Lipid Panel    Component Value Date/Time   CHOL 211 (H) 08/09/2020 0833   TRIG 93 08/09/2020 0833   HDL 49 08/09/2020 0833   LDLCALC 145 (H) 08/09/2020 0833   LDLDIRECT 146 (H) 08/09/2020 8416    Physical Exam:   VS:  There were no vitals taken for this visit.   Wt Readings from Last 3 Encounters:  05/12/21 173 lb (78.5 kg)  05/10/21 166 lb 3.6 oz (75.4 kg)  04/24/21 171 lb 8 oz (77.8 kg)    General: Well nourished, well developed, in no acute distress Head: Atraumatic, normal size  Eyes: PEERLA, EOMI  Neck: Supple, no JVD Endocrine: No thryomegaly Cardiac: Normal S1, S2; RRR; no murmurs, rubs, or gallops Lungs: Clear to auscultation bilaterally, no wheezing, rhonchi or rales   Abd: Soft, nontender, no hepatomegaly  Ext: No edema, pulses 2+ Musculoskeletal: No deformities, BUE and BLE strength normal and equal Skin: Warm and dry, no rashes   Neuro: Alert and oriented to person, place, time, and situation, CNII-XII grossly intact, no focal deficits  Psych: Normal mood and affect   ASSESSMENT:   John Spencer is a 72 y.o. male who presents for the following: No diagnosis found.  PLAN:   There are no diagnoses linked to this encounter.  {Are you ordering a CV Procedure (e.g. stress test, cath, DCCV, TEE, etc)?   Press F2        :[id number]}  Disposition: No follow-ups on file.  Medication Adjustments/Labs and Tests Ordered: Current medicines are reviewed at length with the patient today.  Concerns regarding medicines are outlined above.  No orders of the defined types were placed in this encounter.  No orders of the defined types were placed in this encounter.  There are no Patient Instructions on file for this visit.   Time Spent with Patient: I have spent a total of *** minutes with patient reviewing hospital notes, telemetry, EKGs, labs and examining the patient as well as establishing an assessment and plan that was discussed with the patient.  > 50% of time was spent in direct patient care.  Signed, Lenna Gilford. Flora Lipps, MD, [facility]  [facility]  [address] [place], [place] [address] [phone]  12/07/2022 2:48 PM

## 2022-12-08 ENCOUNTER — Other Ambulatory Visit: Payer: Self-pay | Admitting: Family Medicine

## 2022-12-08 ENCOUNTER — Ambulatory Visit (HOSPITAL_COMMUNITY)
Admission: RE | Admit: 2022-12-08 | Discharge: 2022-12-08 | Disposition: A | Discharge status: Home / Self Care | Payer: No Typology Code available for payment source | Source: Ambulatory Visit | Attending: Internal Medicine | Admitting: Internal Medicine

## 2022-12-08 DIAGNOSIS — Z955 Presence of coronary angioplasty implant and graft: Secondary | ICD-10-CM | POA: Insufficient documentation

## 2022-12-08 DIAGNOSIS — G4452 New daily persistent headache (NDPH): Secondary | ICD-10-CM

## 2022-12-08 DIAGNOSIS — R001 Bradycardia, unspecified: Secondary | ICD-10-CM | POA: Diagnosis not present

## 2022-12-08 DIAGNOSIS — Z6821 Body mass index (BMI) 21.0-21.9, adult: Secondary | ICD-10-CM | POA: Diagnosis not present

## 2022-12-08 DIAGNOSIS — J029 Acute pharyngitis, unspecified: Secondary | ICD-10-CM | POA: Diagnosis not present

## 2022-12-08 DIAGNOSIS — R42 Dizziness and giddiness: Secondary | ICD-10-CM | POA: Insufficient documentation

## 2022-12-08 DIAGNOSIS — I2510 Atherosclerotic heart disease of native coronary artery without angina pectoris: Secondary | ICD-10-CM | POA: Diagnosis not present

## 2022-12-08 DIAGNOSIS — Z125 Encounter for screening for malignant neoplasm of prostate: Secondary | ICD-10-CM | POA: Diagnosis not present

## 2022-12-08 DIAGNOSIS — R5383 Other fatigue: Secondary | ICD-10-CM | POA: Diagnosis not present

## 2022-12-08 DIAGNOSIS — R9389 Abnormal findings on diagnostic imaging of other specified body structures: Secondary | ICD-10-CM | POA: Diagnosis present

## 2022-12-08 DIAGNOSIS — Z87891 Personal history of nicotine dependence: Secondary | ICD-10-CM | POA: Diagnosis present

## 2022-12-08 DIAGNOSIS — H9209 Otalgia, unspecified ear: Secondary | ICD-10-CM | POA: Diagnosis not present

## 2022-12-08 DIAGNOSIS — J449 Chronic obstructive pulmonary disease, unspecified: Secondary | ICD-10-CM | POA: Diagnosis not present

## 2022-12-08 LAB — PULMONARY FUNCTION TEST
DL/VA % pred: 58 %
DL/VA: 2.32 ml/min/mmHg/L
DLCO unc % pred: 66 %
DLCO unc: 17.71 ml/min/mmHg
FEF 25-75 Post: 1.27 L/s
FEF 25-75 Pre: 1.95 L/s
FEF2575-%Change-Post: -34 %
FEF2575-%Pred-Post: 51 %
FEF2575-%Pred-Pre: 79 %
FEV1-%Change-Post: -23 %
FEV1-%Pred-Post: 58 %
FEV1-%Pred-Pre: 76 %
FEV1-Post: 1.94 L
FEV1-Pre: 2.55 L
FEV1FVC-%Change-Post: -23 %
FEV1FVC-%Pred-Pre: 86 %
FEV6-%Change-Post: 0 %
FEV6-%Pred-Post: 93 %
FEV6-%Pred-Pre: 93 %
FEV6-Post: 4.03 L
FEV6-Pre: 4.03 L
FEV6FVC-%Pred-Post: 106 %
FEV6FVC-%Pred-Pre: 106 %
FVC-%Change-Post: 0 %
FVC-%Pred-Post: 88 %
FVC-%Pred-Pre: 88 %
FVC-Post: 4.03 L
FVC-Pre: 4.03 L
Post FEV1/FVC ratio: 48 %
Post FEV6/FVC ratio: 100 %
Pre FEV1/FVC ratio: 63 %
Pre FEV6/FVC Ratio: 100 %
RV % pred: 109 %
RV: 2.85 L
TLC % pred: 96 %
TLC: 7.11 L

## 2022-12-08 MED ORDER — ALBUTEROL SULFATE (2.5 MG/3ML) 0.083% IN NEBU
2.5000 mg | INHALATION_SOLUTION | Freq: Once | RESPIRATORY_TRACT | Status: AC
  Administered 2022-12-08: 2.5 mg via RESPIRATORY_TRACT

## 2022-12-09 ENCOUNTER — Other Ambulatory Visit (HOSPITAL_COMMUNITY): Payer: Self-pay | Admitting: Family Medicine

## 2022-12-09 ENCOUNTER — Other Ambulatory Visit: Payer: Self-pay | Admitting: Family Medicine

## 2022-12-09 ENCOUNTER — Ambulatory Visit
Payer: No Typology Code available for payment source | Attending: Cardiovascular Disease | Admitting: Cardiovascular Disease

## 2022-12-09 ENCOUNTER — Encounter: Payer: Self-pay | Admitting: Cardiovascular Disease

## 2022-12-09 VITALS — BP 130/74 | HR 70 | Ht 71.5 in | Wt 155.6 lb

## 2022-12-09 DIAGNOSIS — I2510 Atherosclerotic heart disease of native coronary artery without angina pectoris: Secondary | ICD-10-CM | POA: Diagnosis not present

## 2022-12-09 DIAGNOSIS — R0602 Shortness of breath: Secondary | ICD-10-CM

## 2022-12-09 DIAGNOSIS — R001 Bradycardia, unspecified: Secondary | ICD-10-CM

## 2022-12-09 DIAGNOSIS — E782 Mixed hyperlipidemia: Secondary | ICD-10-CM

## 2022-12-09 MED ORDER — ASPIRIN 81 MG PO TBEC: 81.0000 mg | DELAYED_RELEASE_TABLET | Freq: Every day | ORAL | Status: DC

## 2022-12-09 NOTE — Patient Instructions (Signed)
Medication Instructions:  START ASPIRIN 81 MG 1 DAILY  *If you need a refill on your cardiac medications before your next appointment, please call your pharmacy*   Lab Work: NONE If you have labs (blood work) drawn today and your tests are completely normal, you will receive your results only by: MyChart Message (if you have MyChart) OR A paper copy in the mail If you have any lab test that is abnormal or we need to change your treatment, we will call you to review the results.   Testing/Procedures: Your physician has requested that you have an echocardiogram. Echocardiography is a painless test that uses sound waves to create images of your heart. It provides your doctor with information about the size and shape of your heart and how well your heart's chambers and valves are working. This procedure takes approximately one hour. There are no restrictions for this procedure. Please do NOT wear cologne, perfume, aftershave, or lotions (deodorant is allowed). Please arrive 15 minutes prior to your appointment time.    Follow-Up: At [facility], you and your health needs are our priority.  As part of our continuing mission to provide you with exceptional heart care, we have created designated Provider Care Teams.  These Care Teams include your primary Cardiologist (physician) and Advanced Practice Providers (APPs -  Physician Assistants and Nurse Practitioners) who all work together to provide you with the care you need, when you need it.  We recommend signing up for the patient portal called "MyChart".  Sign up information is provided on this After Visit Summary.  MyChart is used to connect with patients for Virtual Visits (Telemedicine).  Patients are able to view lab/test results, encounter notes, upcoming appointments, etc.  Non-urgent messages can be sent to your provider as well.   To learn more about what you can do with MyChart, go to [email or web address].    Your next  appointment:   1 year(s)  Provider:   DR Scharlene Gloss    Other Instructions NONE

## 2022-12-11 ENCOUNTER — Other Ambulatory Visit: Payer: Self-pay | Admitting: Family Medicine

## 2022-12-11 ENCOUNTER — Ambulatory Visit
Admission: RE | Admit: 2022-12-11 | Discharge: 2022-12-11 | Disposition: A | Discharge status: Home / Self Care | Payer: No Typology Code available for payment source | Source: Ambulatory Visit | Attending: Family Medicine | Admitting: Family Medicine

## 2022-12-11 DIAGNOSIS — G4452 New daily persistent headache (NDPH): Secondary | ICD-10-CM

## 2022-12-11 DIAGNOSIS — R0602 Shortness of breath: Secondary | ICD-10-CM

## 2022-12-11 DIAGNOSIS — R519 Headache, unspecified: Secondary | ICD-10-CM | POA: Diagnosis not present

## 2022-12-11 MED ORDER — IOPAMIDOL (ISOVUE-300) INJECTION 61%
500.0000 mL | Freq: Once | INTRAVENOUS | Status: AC | PRN
  Administered 2022-12-11: 150 mL via INTRAVENOUS

## 2022-12-15 ENCOUNTER — Ambulatory Visit: Payer: No Typology Code available for payment source | Admitting: Gastroenterology

## 2022-12-15 DIAGNOSIS — R634 Abnormal weight loss: Secondary | ICD-10-CM | POA: Diagnosis not present

## 2022-12-15 DIAGNOSIS — Z800 Family history of malignant neoplasm of digestive organs: Secondary | ICD-10-CM | POA: Diagnosis not present

## 2022-12-15 DIAGNOSIS — Z6821 Body mass index (BMI) 21.0-21.9, adult: Secondary | ICD-10-CM | POA: Diagnosis not present

## 2022-12-15 DIAGNOSIS — R899 Unspecified abnormal finding in specimens from other organs, systems and tissues: Secondary | ICD-10-CM | POA: Diagnosis not present

## 2022-12-15 DIAGNOSIS — E538 Deficiency of other specified B group vitamins: Secondary | ICD-10-CM | POA: Diagnosis not present

## 2022-12-15 DIAGNOSIS — R070 Pain in throat: Secondary | ICD-10-CM | POA: Diagnosis not present

## 2022-12-15 DIAGNOSIS — Z1211 Encounter for screening for malignant neoplasm of colon: Secondary | ICD-10-CM | POA: Diagnosis not present

## 2022-12-21 ENCOUNTER — Telehealth (HOSPITAL_COMMUNITY): Payer: Self-pay | Admitting: Cardiovascular Disease

## 2022-12-21 NOTE — Telephone Encounter (Signed)
Patient called and cancelled echocardiogram for 12/22/22 and does not wish to reschedule. Order will be removed from the echo WQ.

## 2022-12-22 ENCOUNTER — Encounter (HOSPITAL_COMMUNITY): Payer: Self-pay

## 2022-12-22 ENCOUNTER — Ambulatory Visit (HOSPITAL_COMMUNITY): Payer: No Typology Code available for payment source

## 2022-12-24 DIAGNOSIS — J029 Acute pharyngitis, unspecified: Secondary | ICD-10-CM | POA: Diagnosis not present

## 2022-12-29 ENCOUNTER — Telehealth: Payer: Self-pay

## 2022-12-29 NOTE — Telephone Encounter (Signed)
Pre-operative Risk Assessment    Patient Name: John Spencer  DOB: 02/16/1951 MRN: [id number]      Request for Surgical Clearance    Procedure:   Endoscopy/Colonoscopy  Date of Surgery:  Clearance 01/07/23                                 Surgeon:  Dr. Kathi Der Surgeon's Group or Practice Name:  [facility]  Phone number:  [phone] Fax number:  [phone]   Type of Clearance Requested:   - Medical  - Pharmacy:  Hold Aspirin     Type of Anesthesia:   Propofol   Additional requests/questions:   N/A  SignedStevan Born   12/29/2022, 10:58 AM

## 2022-12-29 NOTE — Telephone Encounter (Signed)
Good Morning Dr. Flora Lipps  We have received a surgical clearance request for EGD/colonoscopy for Mr. John Spencer. They were seen recently in clinic on 12/09/2022. Can you please comment on surgical clearance and guidance on holding aspirin 81 mg. Please forward you guidance and recommendations to P CV DIV PREOP   Thank you

## 2022-12-30 NOTE — Telephone Encounter (Signed)
Patient Name: John Spencer  DOB: November 14, 1950 MRN: [id number]  Primary Cardiologist: None  Chart reviewed as part of pre-operative protocol coverage. Given past medical history and time since last visit, based on ACC/AHA guidelines, Ajmal Grober is at acceptable risk for the planned procedure without further cardiovascular testing.   Patient can hold aspirin 7 days prior to procedure and should restart postprocedure when surgically safe and hemostasis is achieved.  The patient was advised that if he develops new symptoms prior to surgery to contact our office to arrange for a follow-up visit, and he verbalized understanding.  I will route this recommendation to the requesting party via Epic fax function and remove from pre-op pool.  Please call with questions.  Napoleon Form, Leodis Rains, NP 12/30/2022, 8:12 AM

## 2023-01-07 DIAGNOSIS — Z1211 Encounter for screening for malignant neoplasm of colon: Secondary | ICD-10-CM | POA: Diagnosis not present

## 2023-01-07 DIAGNOSIS — B9681 Helicobacter pylori [H. pylori] as the cause of diseases classified elsewhere: Secondary | ICD-10-CM | POA: Diagnosis not present

## 2023-01-07 DIAGNOSIS — D122 Benign neoplasm of ascending colon: Secondary | ICD-10-CM | POA: Diagnosis not present

## 2023-01-07 DIAGNOSIS — K5730 Diverticulosis of large intestine without perforation or abscess without bleeding: Secondary | ICD-10-CM | POA: Diagnosis not present

## 2023-01-07 DIAGNOSIS — K259 Gastric ulcer, unspecified as acute or chronic, without hemorrhage or perforation: Secondary | ICD-10-CM | POA: Diagnosis not present

## 2023-01-07 DIAGNOSIS — K2930 Chronic superficial gastritis without bleeding: Secondary | ICD-10-CM | POA: Diagnosis not present

## 2023-01-07 DIAGNOSIS — Z800 Family history of malignant neoplasm of digestive organs: Secondary | ICD-10-CM | POA: Diagnosis not present

## 2023-01-07 DIAGNOSIS — K269 Duodenal ulcer, unspecified as acute or chronic, without hemorrhage or perforation: Secondary | ICD-10-CM | POA: Diagnosis not present

## 2023-01-07 DIAGNOSIS — R634 Abnormal weight loss: Secondary | ICD-10-CM | POA: Diagnosis not present

## 2023-01-13 DIAGNOSIS — B9681 Helicobacter pylori [H. pylori] as the cause of diseases classified elsewhere: Secondary | ICD-10-CM | POA: Diagnosis not present

## 2023-01-13 DIAGNOSIS — D122 Benign neoplasm of ascending colon: Secondary | ICD-10-CM | POA: Diagnosis not present

## 2023-01-13 DIAGNOSIS — K2930 Chronic superficial gastritis without bleeding: Secondary | ICD-10-CM | POA: Diagnosis not present

## 2023-01-19 ENCOUNTER — Institutional Professional Consult (permissible substitution) (INDEPENDENT_AMBULATORY_CARE_PROVIDER_SITE_OTHER): Payer: No Typology Code available for payment source | Admitting: Otolaryngology

## 2023-03-22 DIAGNOSIS — M25511 Pain in right shoulder: Secondary | ICD-10-CM | POA: Diagnosis not present

## 2023-04-29 DIAGNOSIS — R0981 Nasal congestion: Secondary | ICD-10-CM | POA: Diagnosis not present

## 2023-04-29 DIAGNOSIS — R051 Acute cough: Secondary | ICD-10-CM | POA: Diagnosis not present

## 2023-04-29 DIAGNOSIS — Z03818 Encounter for observation for suspected exposure to other biological agents ruled out: Secondary | ICD-10-CM | POA: Diagnosis not present

## 2023-07-14 DIAGNOSIS — R1032 Left lower quadrant pain: Secondary | ICD-10-CM | POA: Diagnosis not present

## 2023-07-14 DIAGNOSIS — E538 Deficiency of other specified B group vitamins: Secondary | ICD-10-CM | POA: Diagnosis not present

## 2023-07-14 DIAGNOSIS — R634 Abnormal weight loss: Secondary | ICD-10-CM | POA: Diagnosis not present

## 2023-07-14 DIAGNOSIS — A048 Other specified bacterial intestinal infections: Secondary | ICD-10-CM | POA: Diagnosis not present

## 2023-07-14 DIAGNOSIS — Z6821 Body mass index (BMI) 21.0-21.9, adult: Secondary | ICD-10-CM | POA: Diagnosis not present

## 2023-08-04 DIAGNOSIS — A048 Other specified bacterial intestinal infections: Secondary | ICD-10-CM | POA: Diagnosis not present

## 2023-09-07 DIAGNOSIS — E0590 Thyrotoxicosis, unspecified without thyrotoxic crisis or storm: Secondary | ICD-10-CM | POA: Diagnosis not present

## 2023-09-07 DIAGNOSIS — Z789 Other specified health status: Secondary | ICD-10-CM | POA: Diagnosis not present

## 2023-09-07 DIAGNOSIS — L80 Vitiligo: Secondary | ICD-10-CM | POA: Diagnosis not present

## 2023-09-07 DIAGNOSIS — E538 Deficiency of other specified B group vitamins: Secondary | ICD-10-CM | POA: Diagnosis not present

## 2023-09-07 DIAGNOSIS — E041 Nontoxic single thyroid nodule: Secondary | ICD-10-CM | POA: Diagnosis not present

## 2023-09-16 DIAGNOSIS — L80 Vitiligo: Secondary | ICD-10-CM | POA: Diagnosis not present

## 2023-09-16 DIAGNOSIS — E0500 Thyrotoxicosis with diffuse goiter without thyrotoxic crisis or storm: Secondary | ICD-10-CM | POA: Diagnosis not present

## 2023-09-16 DIAGNOSIS — E538 Deficiency of other specified B group vitamins: Secondary | ICD-10-CM | POA: Diagnosis not present

## 2023-09-16 DIAGNOSIS — E0590 Thyrotoxicosis, unspecified without thyrotoxic crisis or storm: Secondary | ICD-10-CM | POA: Diagnosis not present

## 2023-09-16 DIAGNOSIS — E041 Nontoxic single thyroid nodule: Secondary | ICD-10-CM | POA: Diagnosis not present

## 2023-10-05 DIAGNOSIS — H524 Presbyopia: Secondary | ICD-10-CM | POA: Diagnosis not present

## 2023-10-05 DIAGNOSIS — H52223 Regular astigmatism, bilateral: Secondary | ICD-10-CM | POA: Diagnosis not present

## 2023-10-05 DIAGNOSIS — H5201 Hypermetropia, right eye: Secondary | ICD-10-CM | POA: Diagnosis not present

## 2023-10-06 DIAGNOSIS — E041 Nontoxic single thyroid nodule: Secondary | ICD-10-CM | POA: Diagnosis not present

## 2023-10-06 DIAGNOSIS — E0500 Thyrotoxicosis with diffuse goiter without thyrotoxic crisis or storm: Secondary | ICD-10-CM | POA: Diagnosis not present

## 2023-10-06 DIAGNOSIS — E0590 Thyrotoxicosis, unspecified without thyrotoxic crisis or storm: Secondary | ICD-10-CM | POA: Diagnosis not present

## 2023-10-06 DIAGNOSIS — R5383 Other fatigue: Secondary | ICD-10-CM | POA: Diagnosis not present

## 2023-10-06 DIAGNOSIS — R2681 Unsteadiness on feet: Secondary | ICD-10-CM | POA: Diagnosis not present

## 2023-10-06 DIAGNOSIS — M542 Cervicalgia: Secondary | ICD-10-CM | POA: Diagnosis not present

## 2023-10-06 DIAGNOSIS — F329 Major depressive disorder, single episode, unspecified: Secondary | ICD-10-CM | POA: Diagnosis not present

## 2023-10-06 DIAGNOSIS — E538 Deficiency of other specified B group vitamins: Secondary | ICD-10-CM | POA: Diagnosis not present

## 2023-10-06 DIAGNOSIS — L80 Vitiligo: Secondary | ICD-10-CM | POA: Diagnosis not present

## 2023-10-12 DIAGNOSIS — I2510 Atherosclerotic heart disease of native coronary artery without angina pectoris: Secondary | ICD-10-CM | POA: Diagnosis not present

## 2023-10-12 DIAGNOSIS — E785 Hyperlipidemia, unspecified: Secondary | ICD-10-CM | POA: Diagnosis not present

## 2023-10-12 DIAGNOSIS — Z8546 Personal history of malignant neoplasm of prostate: Secondary | ICD-10-CM | POA: Diagnosis not present

## 2023-10-12 DIAGNOSIS — Z008 Encounter for other general examination: Secondary | ICD-10-CM | POA: Diagnosis not present

## 2023-10-27 DIAGNOSIS — H04123 Dry eye syndrome of bilateral lacrimal glands: Secondary | ICD-10-CM | POA: Diagnosis not present

## 2023-10-27 DIAGNOSIS — H25811 Combined forms of age-related cataract, right eye: Secondary | ICD-10-CM | POA: Diagnosis not present

## 2023-10-27 DIAGNOSIS — E079 Disorder of thyroid, unspecified: Secondary | ICD-10-CM | POA: Diagnosis not present

## 2023-10-27 DIAGNOSIS — D3132 Benign neoplasm of left choroid: Secondary | ICD-10-CM | POA: Diagnosis not present

## 2023-10-28 DIAGNOSIS — E0500 Thyrotoxicosis with diffuse goiter without thyrotoxic crisis or storm: Secondary | ICD-10-CM | POA: Diagnosis not present

## 2023-10-28 DIAGNOSIS — Z713 Dietary counseling and surveillance: Secondary | ICD-10-CM | POA: Diagnosis not present

## 2023-10-28 DIAGNOSIS — Z789 Other specified health status: Secondary | ICD-10-CM | POA: Diagnosis not present

## 2023-11-03 DIAGNOSIS — H04123 Dry eye syndrome of bilateral lacrimal glands: Secondary | ICD-10-CM | POA: Diagnosis not present

## 2023-11-04 DIAGNOSIS — Z8639 Personal history of other endocrine, nutritional and metabolic disease: Secondary | ICD-10-CM | POA: Diagnosis not present

## 2023-11-04 DIAGNOSIS — E0590 Thyrotoxicosis, unspecified without thyrotoxic crisis or storm: Secondary | ICD-10-CM | POA: Diagnosis not present

## 2023-11-04 DIAGNOSIS — Z789 Other specified health status: Secondary | ICD-10-CM | POA: Diagnosis not present

## 2023-11-04 DIAGNOSIS — E0500 Thyrotoxicosis with diffuse goiter without thyrotoxic crisis or storm: Secondary | ICD-10-CM | POA: Diagnosis not present

## 2023-11-08 DIAGNOSIS — E079 Disorder of thyroid, unspecified: Secondary | ICD-10-CM | POA: Diagnosis not present

## 2023-11-09 ENCOUNTER — Other Ambulatory Visit (HOSPITAL_COMMUNITY): Payer: Self-pay | Admitting: Internal Medicine

## 2023-11-09 ENCOUNTER — Other Ambulatory Visit: Payer: Self-pay | Admitting: Ophthalmology

## 2023-11-09 DIAGNOSIS — E0500 Thyrotoxicosis with diffuse goiter without thyrotoxic crisis or storm: Secondary | ICD-10-CM

## 2023-11-09 DIAGNOSIS — E0590 Thyrotoxicosis, unspecified without thyrotoxic crisis or storm: Secondary | ICD-10-CM

## 2023-11-09 DIAGNOSIS — E079 Disorder of thyroid, unspecified: Secondary | ICD-10-CM

## 2023-11-12 DIAGNOSIS — H532 Diplopia: Secondary | ICD-10-CM | POA: Diagnosis not present

## 2023-11-12 DIAGNOSIS — E0590 Thyrotoxicosis, unspecified without thyrotoxic crisis or storm: Secondary | ICD-10-CM | POA: Diagnosis not present

## 2023-11-12 DIAGNOSIS — E0500 Thyrotoxicosis with diffuse goiter without thyrotoxic crisis or storm: Secondary | ICD-10-CM | POA: Diagnosis not present

## 2023-11-12 DIAGNOSIS — Z8639 Personal history of other endocrine, nutritional and metabolic disease: Secondary | ICD-10-CM | POA: Diagnosis not present

## 2023-11-12 DIAGNOSIS — Z789 Other specified health status: Secondary | ICD-10-CM | POA: Diagnosis not present

## 2023-11-18 ENCOUNTER — Encounter: Payer: Self-pay | Admitting: Ophthalmology

## 2023-11-22 ENCOUNTER — Ambulatory Visit
Admission: RE | Admit: 2023-11-22 | Discharge: 2023-11-22 | Disposition: A | Discharge status: Home / Self Care | Source: Ambulatory Visit | Attending: Ophthalmology | Admitting: Ophthalmology

## 2023-11-22 DIAGNOSIS — E079 Disorder of thyroid, unspecified: Secondary | ICD-10-CM

## 2023-11-22 DIAGNOSIS — J320 Chronic maxillary sinusitis: Secondary | ICD-10-CM | POA: Diagnosis not present

## 2023-11-22 DIAGNOSIS — J341 Cyst and mucocele of nose and nasal sinus: Secondary | ICD-10-CM | POA: Diagnosis not present

## 2023-11-22 MED ORDER — GADOPICLENOL 0.5 MMOL/ML IV SOLN
7.0000 mL | Freq: Once | INTRAVENOUS | Status: AC | PRN
  Administered 2023-11-22: 7 mL via INTRAVENOUS

## 2023-11-23 ENCOUNTER — Ambulatory Visit
Admission: EM | Admit: 2023-11-23 | Discharge: 2023-11-23 | Disposition: A | Discharge status: Home / Self Care | Attending: Family Medicine | Admitting: Family Medicine

## 2023-11-23 DIAGNOSIS — H5712 Ocular pain, left eye: Secondary | ICD-10-CM | POA: Diagnosis not present

## 2023-11-23 DIAGNOSIS — R519 Headache, unspecified: Secondary | ICD-10-CM | POA: Diagnosis not present

## 2023-11-23 DIAGNOSIS — G8929 Other chronic pain: Secondary | ICD-10-CM | POA: Diagnosis not present

## 2023-11-23 NOTE — ED Provider Notes (Signed)
 [facility]  Note:  This document was prepared using Conservation officer, historic buildings and may include unintentional dictation errors.  MRN: [id number] DOB: 1950-09-06  Subjective:   John Spencer is a 73 y.o. male presenting for 2-week history of persistent eye discomfort.  Reports that he has had intermittent difficulty closing the left upper eyelid.  The last episode was this morning.  Now throughout the day he is able to close his eye.  Has been seen by multiple specialists.  He just had an MRI done of the brain and orbits yesterday.  He was using prescription eyedrops that his ophthalmologist have prescribed to him but these were samples.  Ultimately the prescription was very expensive for the patient.  Denies a headache, weakness, numbness or tingling.  He does feel that his balance has been affected.   No current facility-administered medications for this encounter.  Current Outpatient Medications:    MIEBO 1.338 GM/ML SOLN, Apply 1 drop to eye 4 (four) times daily., Disp: , Rfl:    predniSONE (DELTASONE) 10 MG tablet, 4 tablets a day for 2 weeks, 3 tablets a day for 2 weeks, 2 tablets a day for 2 weeks, 1 tablet a da Orally Once a day; Duration: 56 days, Disp: , Rfl:    aspirin  EC 81 MG tablet, Take 1 tablet (81 mg total) by mouth daily. Swallow whole., Disp: , Rfl:    Cyanocobalamin  (VITAMIN B12) 1000 MCG TABS, 1 tablet Orally Once a day, Disp: , Rfl:    rosuvastatin  (CRESTOR ) 20 MG tablet, TAKE 1 TABLET BY MOUTH EVERYDAY AT BEDTIME, Disp: 90 tablet, Rfl: 0   Allergies  Allergen Reactions   Finasteride      Other Reaction(s): blurred vision, nausea, depression   Methimazole Other (See Comments)    Past Medical History:  Diagnosis Date   At average risk for colon cancer    BPH (benign prostatic hyperplasia)    Cancer (HCC)    Coronary artery disease    Prior PCI x4, per patient.   Depression    Diabetes mellitus without complication (HCC)    Heart  attack (HCC)    Internal carotid artery stenosis, left    Pneumonia      Past Surgical History:  Procedure Laterality Date   APPENDECTOMY     XI ROBOTIC ASSISTED SIMPLE PROSTATECTOMY N/A 05/09/2021   Procedure: XI ROBOTIC ASSISTED SIMPLE PROSTATECTOMY;  Surgeon: Alvaro Hummer, MD;  Location: [facility];  Service: Urology;  Laterality: N/A;    Family History  Problem Relation Age of Onset   Heart disease Mother    Hypertension Mother    Heart disease Father    Hypertension Father    Colon cancer Sister    Hypertension Brother     Social History   Tobacco Use   Smoking status: Former    Current packs/day: 0.00    Average packs/day: 1 pack/day for 3.0 years (3.0 ttl pk-yrs)    Types: Cigarettes    Start date: 65    Quit date: 1992    Years since quitting: 33.6   Smokeless tobacco: Never  Vaping Use   Vaping status: Never Used  Substance Use Topics   Alcohol use: Never   Drug use: Not Currently    ROS   Objective:   Vitals: BP 133/65 (BP Location: Right Arm)   Pulse 64   Temp 98.4 F (36.9 C) (Oral)   Resp 18   SpO2 95%   Physical Exam Constitutional:  General: He is not in acute distress.    Appearance: Normal appearance. He is well-developed and normal weight. He is not ill-appearing, toxic-appearing or diaphoretic.  HENT:     Head: Normocephalic and atraumatic.     Right Ear: Tympanic membrane, ear canal and external ear normal. No drainage, swelling or tenderness. No middle ear effusion. There is no impacted cerumen. Tympanic membrane is not erythematous or bulging.     Left Ear: Tympanic membrane, ear canal and external ear normal. No drainage, swelling or tenderness.  No middle ear effusion. There is no impacted cerumen. Tympanic membrane is not erythematous or bulging.     Nose: Nose normal. No congestion or rhinorrhea.     Mouth/Throat:     Mouth: Mucous membranes are moist.     Pharynx: Oropharynx is clear. No oropharyngeal exudate or posterior  oropharyngeal erythema.  Eyes:     General: Lids are everted, no foreign bodies appreciated. No scleral icterus.       Right eye: No foreign body, discharge or hordeolum.        Left eye: No foreign body, discharge or hordeolum.     Extraocular Movements: Extraocular movements intact.     Conjunctiva/sclera: Conjunctivae normal.     Right eye: Right conjunctiva is not injected. No chemosis, exudate or hemorrhage.    Left eye: Left conjunctiva is not injected. No chemosis, exudate or hemorrhage.    Pupils: Pupils are equal, round, and reactive to light.     Comments: Eyelids are closing bilaterally.  Cardiovascular:     Rate and Rhythm: Normal rate.  Pulmonary:     Effort: Pulmonary effort is normal.  Musculoskeletal:     Cervical back: Normal range of motion and neck supple. No rigidity. No muscular tenderness.  Neurological:     General: No focal deficit present.     Mental Status: He is alert and oriented to person, place, and time.     Cranial Nerves: No cranial nerve deficit.     Motor: No weakness.     Coordination: Coordination normal.     Gait: Gait normal.     Comments: No facial droop.  Patient is able to wrinkle forehead symmetrically.  Psychiatric:        Mood and Affect: Mood normal.        Behavior: Behavior normal.        Thought Content: Thought content normal.        Judgment: Judgment normal.     Assessment and Plan :   PDMP not reviewed this encounter.  1. Eye discomfort, left   2. Chronic nonintractable headache, unspecified headache type    Recommended following up with his specialist providers especially since he just had his MRI yesterday.  In clinic, patient is hemodynamically stable and has no neurologic findings on exam.  I did not recommend an emergency room visit.  Recommended using over-the-counter eye lubricant, artificial tears.  He could also follow-up with his eye specialist today to see if they can send in a new prescription than what was sent  for his eyes.  Counseled patient on potential for adverse effects with medications prescribed/recommended today, ER and return-to-clinic precautions discussed, patient verbalized understanding.  Patient refuses AVS.   Christopher Savannah, PA-C 11/23/23 1249

## 2023-11-23 NOTE — ED Triage Notes (Addendum)
 Pt reports he can just blink the right eye not the left eye, headache x 4-5 months. States this is affecting his balance. Pt reports he was seeing by an Ophthalmology for this complaint and was told is related to his hyperthyroidism.   Reports MIEBO eye drops were prescribed, but he was not able to buy it as they are too expensive.

## 2023-11-30 ENCOUNTER — Encounter (HOSPITAL_COMMUNITY)
Admission: RE | Admit: 2023-11-30 | Discharge: 2023-11-30 | Disposition: A | Discharge status: Home / Self Care | Source: Ambulatory Visit | Attending: Internal Medicine | Admitting: Internal Medicine

## 2023-11-30 DIAGNOSIS — E0500 Thyrotoxicosis with diffuse goiter without thyrotoxic crisis or storm: Secondary | ICD-10-CM | POA: Diagnosis not present

## 2023-11-30 DIAGNOSIS — E0590 Thyrotoxicosis, unspecified without thyrotoxic crisis or storm: Secondary | ICD-10-CM | POA: Diagnosis not present

## 2023-11-30 MED ORDER — SODIUM IODIDE I-123 7.4 MBQ CAPS
458.0000 | ORAL_CAPSULE | Freq: Once | ORAL | Status: AC
  Administered 2023-11-30: 458 via ORAL

## 2023-12-01 ENCOUNTER — Encounter (HOSPITAL_COMMUNITY)
Admission: RE | Admit: 2023-12-01 | Discharge: 2023-12-01 | Disposition: A | Discharge status: Home / Self Care | Source: Ambulatory Visit | Attending: Internal Medicine | Admitting: Internal Medicine

## 2023-12-01 DIAGNOSIS — E0500 Thyrotoxicosis with diffuse goiter without thyrotoxic crisis or storm: Secondary | ICD-10-CM | POA: Diagnosis not present

## 2023-12-01 DIAGNOSIS — E0590 Thyrotoxicosis, unspecified without thyrotoxic crisis or storm: Secondary | ICD-10-CM | POA: Insufficient documentation

## 2023-12-01 DIAGNOSIS — E069 Thyroiditis, unspecified: Secondary | ICD-10-CM | POA: Diagnosis not present

## 2023-12-05 ENCOUNTER — Other Ambulatory Visit

## 2023-12-07 DIAGNOSIS — E0590 Thyrotoxicosis, unspecified without thyrotoxic crisis or storm: Secondary | ICD-10-CM | POA: Diagnosis not present

## 2023-12-07 DIAGNOSIS — I739 Peripheral vascular disease, unspecified: Secondary | ICD-10-CM | POA: Diagnosis not present

## 2023-12-07 DIAGNOSIS — Z8639 Personal history of other endocrine, nutritional and metabolic disease: Secondary | ICD-10-CM | POA: Diagnosis not present

## 2023-12-07 DIAGNOSIS — E0500 Thyrotoxicosis with diffuse goiter without thyrotoxic crisis or storm: Secondary | ICD-10-CM | POA: Diagnosis not present

## 2023-12-07 DIAGNOSIS — R519 Headache, unspecified: Secondary | ICD-10-CM | POA: Diagnosis not present

## 2023-12-07 DIAGNOSIS — H532 Diplopia: Secondary | ICD-10-CM | POA: Diagnosis not present

## 2023-12-08 ENCOUNTER — Encounter: Payer: Self-pay | Admitting: Neurology

## 2023-12-08 ENCOUNTER — Ambulatory Visit (INDEPENDENT_AMBULATORY_CARE_PROVIDER_SITE_OTHER): Admitting: Neurology

## 2023-12-08 VITALS — BP 130/69 | HR 87 | Ht 71.0 in | Wt 155.0 lb

## 2023-12-08 DIAGNOSIS — H532 Diplopia: Secondary | ICD-10-CM | POA: Insufficient documentation

## 2023-12-08 NOTE — Progress Notes (Addendum)
 Chief Complaint  Patient presents with   Headache    Rm 14 alone Pt is well, reports he has been having impaired gait, daily headaches and double vision for about 3 months. He reports he went to the eye doctor and his vision has not been the same post dilation.        ASSESSMENT AND PLAN  John Spencer is a 73 y.o. male   Binocular diplopia  In the setting of Graves' disease, significant hyperthyroidism, most consistent with thyroid  eye disease, MRI of orbit did demonstrate mild thickening of right inferior rectus muscle,  There was no bulbar or limb muscle weakness noted,  Myasthenia gravis panel to rule out neuromuscular junctional disorder  Suggested to proceed with planned surgical consult, potential thyroid  removal,  Will inform patient lab result   DIAGNOSTIC DATA (LABS, IMAGING, TESTING) - I reviewed patient records, labs, notes, testing and imaging myself where available.   MEDICAL HISTORY:  John Spencer is a 73 year old male, seen in request by his endocrinologist from [facility] Dr. Faythe Purchase, for evaluation of double vision, initial evaluation December 08, 2023  History is obtained from the patient and review of electronic medical records. I personally reviewed pertinent available imaging films in PACS.   PMHx of  CAD Grave's disease with hyperthyroidism  Around 2025, he began to notice weight loss, used to be very physically active, play pickle ball 5 times a week, 2 to 3 hours each time  Workup has demonstrated significant hyperthyroidism, suppressed TSH less than 0.01, elevation of free T4, he was giving a short trial of methimazole could not tolerated, complains of  out of sorts, depression,, he is now in the process of being referred to surgeon considering thyroidectomy  Around July, following his regular visit with his ophthalmologist, he developed binocular double vision, has been persistent since then, he denies swallowing difficulty, no limb muscle  weakness,  I reviewed ophthalmology evaluation by Dr. 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  No evidence of an acute intracranial abnormality. 2. Mild chronic small vessel ischemic changes within the cerebral white matter, slightly progressed from the prior MRI of 08/20/2020. 3. 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   MRI orbits:   1. Mildly thickened appearance of the right inferior rectus muscle. Although nonspecific, this could reflect an early/mild manifestation of thyroid  eye disease given the provided history. 2. Prior left ocular lens replacement. 3. Otherwise unremarkable MRI appearance of the orbits. 4. Bilateral maxillary sinus disease, as described.    Laboratory evaluation from [facility] July 2025, normal B12 857, free T41.15, slightly high, normal range should be 0.61-1.12; TSH was significantly decreased less than 0.01, vitamin D was 32, normal CBC hemoglobin of 15.1, ESR of 7, CPK of 64, normal BMP, creatinine of 1.03, GFR of 76,  TSH was less than 0.01 on November 04, 2023 free T4 remains elevated 1.15 November 08, 2023   Thyroid  uptake with imaging December 01, 2023, imaging findings and iodine uptake consistent with Graves' disease,   PHYSICAL EXAM:   Vitals:   12/08/23 1519  BP: 130/69  Pulse: 87  Weight: 155 lb (70.3 kg)  Height: 5' 11 (1.803 m)      Body mass index is 21.62 kg/m.  PHYSICAL EXAMNIATION:  Gen: NAD, conversant, well nourised, well groomed                     Cardiovascular: Regular rate rhythm, no peripheral edema, warm,  nontender. Eyes: Conjunctivae clear without exudates or hemorrhage Neck: Supple, no carotid bruits. Pulmonary: Clear to auscultation bilaterally   NEUROLOGICAL EXAM:  MENTAL STATUS: Speech/cognition: Awake, alert, oriented to history taking and casual conversation CRANIAL NERVES: CN II: Visual fields are full to confrontation. Pupils are round equal and briskly reactive to light. CN III, IV, VI: extraocular movement are normal. No ptosis.  Cover and uncover showed right inferior/exophoria, left superior/exophoria CN V: Facial sensation is intact to light touch CN VII: Face is symmetric with normal eye closure  CN VIII: Hearing is normal to causal conversation. CN IX, X: Phonation is normal. CN XI: Head turning and shoulder shrug are intact  MOTOR: There is no pronator drift of out-stretched arms. Muscle bulk and tone are normal. Muscle strength is normal.  REFLEXES: Reflexes are 2+ and symmetric at the biceps, triceps, knees, and ankles. Plantar responses are flexor.  SENSORY: Intact to light touch, pinprick and vibratory sensation are intact in fingers and toes.  COORDINATION: There is no trunk or limb dysmetria noted.  GAIT/STANCE: Posture is normal. Gait is steady with normal steps, base, arm swing, and turning. Heel and toe walking are normal. Tandem gait is normal.  Romberg is absent.  REVIEW OF SYSTEMS:  Full 14 system review of systems performed and notable only for as above All other review of systems were negative.   ALLERGIES: Allergies  Allergen Reactions   Finasteride      Other Reaction(s): blurred vision, nausea, depression   Methimazole Other (See Comments)    HOME MEDICATIONS: Current Outpatient Medications  Medication Sig Dispense Refill   Cyanocobalamin  (VITAMIN B12) 1000 MCG TABS 1 tablet Orally Once a day     predniSONE (DELTASONE) 10 MG tablet 4 tablets a day for 2 weeks, 3 tablets a day for 2 weeks, 2 tablets a day for 2 weeks, 1 tablet a da  Orally Once a day; Duration: 56 days     No current facility-administered medications for this visit.    PAST MEDICAL HISTORY: Past Medical History:  Diagnosis Date   At average risk for colon cancer    BPH (benign prostatic hyperplasia)    Cancer (HCC)    Coronary artery disease    Prior PCI x4, per patient.   Depression    Diabetes mellitus without complication (HCC)    Heart attack (HCC)    Internal carotid artery stenosis, left    Pneumonia     PAST SURGICAL HISTORY: Past Surgical History:  Procedure Laterality Date   APPENDECTOMY     XI ROBOTIC ASSISTED SIMPLE PROSTATECTOMY N/A 05/09/2021   Procedure: XI ROBOTIC ASSISTED SIMPLE PROSTATECTOMY;  Surgeon: Alvaro Hummer, MD;  Location: [facility];  Service: Urology;  Laterality: N/A;    FAMILY HISTORY: Family History  Problem Relation Age of Onset   Heart disease Mother    Hypertension Mother    Heart disease Father    Hypertension Father    Colon cancer Sister    Hypertension Brother     SOCIAL HISTORY: Social History   Socioeconomic History   Marital status: Divorced    Spouse name: Not on file  Number of children: 2   Years of education: Not on file   Highest education level: Not on file  Occupational History   Occupation: Retired Engineer, production  Tobacco Use   Smoking status: Former    Current packs/day: 0.00    Average packs/day: 1 pack/day for 3.0 years (3.0 ttl pk-yrs)    Types: Cigarettes    Start date: 70    Quit date: 1992    Years since quitting: 33.7   Smokeless tobacco: Never  Vaping Use   Vaping status: Never Used  Substance and Sexual Activity   Alcohol use: Never   Drug use: Not Currently   Sexual activity: Not Currently  Other Topics Concern   Not on file  Social History Narrative   Not on file   Social Drivers of Health   Financial Resource Strain: Not on file  Food Insecurity: Low Risk  (12/24/2022)   Received from [facility]   Hunger Vital Sign    Within the past 12 months, you  worried that your food would run out before you got money to buy more: Never true    Within the past 12 months, the food you bought just didn't last and you didn't have money to get more. : Never true  Transportation Needs: No Transportation Needs (12/24/2022)   Received from [facility]    In the past 12 months, has lack of reliable transportation kept you from medical appointments, meetings, work or from getting things needed for daily living? : No  Physical Activity: Not on file  Stress: Not on file  Social Connections: Unknown (08/12/2021)   Received from [facility]   Social Network    Social Network: Not on file  Intimate Partner Violence: Unknown (07/04/2021)   Received from [facility]   HITS    Physically Hurt: Not on file    Insult or Talk Down To: Not on file    Threaten Physical Harm: Not on file    Scream or Curse: Not on file      Modena Callander, M.D. Ph.D.  [facility] [address], [address] [place], [place] [address] Ph: [phone] Fax: [phone]  CC:  Faythe Purchase, MD [address] [place],  [place] [address]  Katina Pfeiffer, PA-C

## 2023-12-09 ENCOUNTER — Encounter: Payer: Self-pay | Admitting: Cardiovascular Disease

## 2023-12-09 ENCOUNTER — Ambulatory Visit: Payer: Self-pay | Admitting: Surgery

## 2023-12-09 DIAGNOSIS — E0500 Thyrotoxicosis with diffuse goiter without thyrotoxic crisis or storm: Secondary | ICD-10-CM | POA: Diagnosis not present

## 2023-12-10 ENCOUNTER — Encounter: Payer: Self-pay | Admitting: Surgery

## 2023-12-10 DIAGNOSIS — E0500 Thyrotoxicosis with diffuse goiter without thyrotoxic crisis or storm: Secondary | ICD-10-CM | POA: Diagnosis present

## 2023-12-10 NOTE — Patient Instructions (Addendum)
 SURGICAL WAITING ROOM VISITATION  Patients having surgery or a procedure may have no more than 2 support people in the waiting area - these visitors may rotate.    Children under the age of 35 must have an adult with them who is not the patient.  Visitors with respiratory illnesses are discouraged from visiting and should remain at home.  If the patient needs to stay at the hospital during part of their recovery, the visitor guidelines for inpatient rooms apply. Pre-op nurse will coordinate an appropriate time for 1 support person to accompany patient in pre-op.  This support person may not rotate.    Please refer to the [facility] website for the visitor guidelines for Inpatients (after your surgery is over and you are in a regular room).    Your procedure is scheduled on: 12/14/23   Report to [facility] Main Entrance    Report to admitting at 5:15 AM   Call this number if you have problems the morning of surgery [phone]   Do not eat food :After Midnight.   After Midnight you may have the following liquids until 4:30 AM DAY OF SURGERY  Water  Non-Citrus Juices (without pulp, NO RED-Apple, White grape, White cranberry) Black Coffee (NO MILK/CREAM OR CREAMERS, sugar ok)  Clear Tea (NO MILK/CREAM OR CREAMERS, sugar ok) regular and decaf                             Plain Jell-O (NO RED)                                           Fruit ices (not with fruit pulp, NO RED)                                     Popsicles (NO RED)                                                               Sports drinks like Gatorade (NO RED)                      If you have questions, please contact your surgeon's office.   FOLLOW BOWEL PREP AND ANY ADDITIONAL PRE OP INSTRUCTIONS YOU RECEIVED FROM YOUR SURGEON'S OFFICE!!!     Oral Hygiene is also important to reduce your risk of infection.                                    Remember - BRUSH YOUR TEETH THE MORNING OF SURGERY WITH YOUR  REGULAR TOOTHPASTE  DENTURES WILL BE REMOVED PRIOR TO SURGERY PLEASE DO NOT APPLY Poly grip OR ADHESIVES!!!   Stop all vitamins and herbal supplements 7 days before surgery.   Take these medicines the morning of surgery with A SIP OF WATER : None  You may not have any metal on your body including jewelry, and body piercing             Do not wear lotions, powders, cologne, or deodorant              Men may shave face and neck.   Do not bring valuables to the hospital. [facility] IS NOT             RESPONSIBLE   FOR VALUABLES.   Contacts, glasses, dentures or bridgework may not be worn into surgery.  DO NOT BRING YOUR HOME MEDICATIONS TO THE HOSPITAL. PHARMACY WILL DISPENSE MEDICATIONS LISTED ON YOUR MEDICATION LIST TO YOU DURING YOUR ADMISSION IN THE HOSPITAL!    Patients discharged on the day of surgery will not be allowed to drive home.  Someone NEEDS to stay with you for the first 24 hours after anesthesia.              Please read over the following fact sheets you were given: IF YOU HAVE QUESTIONS ABOUT YOUR PRE-OP INSTRUCTIONS PLEASE CALL [phone].   If you received a COVID test during your pre-op visit  it is requested that you wear a mask when out in public, stay away from anyone that may not be feeling well and notify your surgeon if you develop symptoms. If you test positive for Covid or have been in contact with anyone that has tested positive in the last 10 days please notify you surgeon.     - Preparing for Surgery Before surgery, you can play an important role.  Because skin is not sterile, your skin needs to be as free of germs as possible.  You can reduce the number of germs on your skin by washing with CHG (chlorahexidine gluconate) soap before surgery.  CHG is an antiseptic cleaner which kills germs and bonds with the skin to continue killing germs even after washing. Please DO NOT use if you have an allergy to CHG  or antibacterial soaps.  If your skin becomes reddened/irritated stop using the CHG and inform your nurse when you arrive at Short Stay. Do not shave (including legs and underarms) for at least 48 hours prior to the first CHG shower.  You may shave your face/neck.  Please follow these instructions carefully:  1.  Shower with CHG Soap the night before surgery and the  morning of surgery.  2.  If you choose to wash your hair, wash your hair first as usual with your normal  shampoo.  3.  After you shampoo, rinse your hair and body thoroughly to remove the shampoo.                             4.  Use CHG as you would any other liquid soap.  You can apply chg directly to the skin and wash.  Gently with a scrungie or clean washcloth.  5.  Apply the CHG Soap to your body ONLY FROM THE NECK DOWN.   Do   not use on face/ open                           Wound or open sores. Avoid contact with eyes, ears mouth and   genitals (private parts).                       Wash  face,  Genitals (private parts) with your normal soap.             6.  Wash thoroughly, paying special attention to the area where your    surgery  will be performed.  7.  Thoroughly rinse your body with warm water  from the neck down.  8.  DO NOT shower/wash with your normal soap after using and rinsing off the CHG Soap.                9.  Pat yourself dry with a clean towel.            10.  Wear clean pajamas.            11.  Place clean sheets on your bed the night of your first shower and do not  sleep with pets. Day of Surgery : Do not apply any lotions/deodorants the morning of surgery.  Please wear clean clothes to the hospital/surgery center.  FAILURE TO FOLLOW THESE INSTRUCTIONS MAY RESULT IN THE CANCELLATION OF YOUR SURGERY  PATIENT SIGNATURE_________________________________  NURSE SIGNATURE__________________________________  ________________________________________________________________________

## 2023-12-10 NOTE — H&P (Signed)
 REFERRING PHYSICIAN: Faythe Reyes RAMAN, MD  PROVIDER: Emmalyne Giacomo OZELL SPINNER, MD   Chief Complaint: New Consultation Alvia' disease)  History of Present Illness:  Patient is referred by Dr. Reyes Faythe from endocrinology for surgical evaluation and management of newly diagnosed Graves' disease. Patient's primary care provider is Charmaine Bright, [place]. Patient first noted weight loss approximately 3 to 4 months ago. He was evaluated and found to have an undetectable TSH level. Additional testing demonstrated evidence of Graves' disease, including a nuclear medicine thyroid  scan in early September. Patient has noted visual changes. He complains of blurred vision. He complains of eye discomfort. He has had problems with double vision. He has been seen by his eye care professional. 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 Patient was placed on methimazole but this was poorly tolerated. He is currently on prednisone. Patient presents today to discuss thyroid  surgery for treatment of Graves' disease. Patient does have a family history of thyroid  disease and his daughter and his nephew, both of whom had some form of malignancy. Patient has had no prior thyroid  surgery. 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.  Review of Systems: A complete review of systems was obtained from the patient. I have reviewed this information and discussed as appropriate with the patient. See HPI as well for other ROS.  Review of Systems  Constitutional: Positive for weight loss.  HENT: Negative.  Eyes: Positive for blurred vision, double vision and pain.  Respiratory: Negative.  Cardiovascular: Negative.  Gastrointestinal: Negative.  Genitourinary: Negative.  Musculoskeletal: Negative.  Skin: Negative.  Neurological: Positive for tremors.  Endo/Heme/Allergies: Negative.  Psychiatric/Behavioral: Negative.    Medical History: Past Medical History:  Diagnosis Date  Arthritis    Patient Active Problem List  Diagnosis  Graves' disease   Past Surgical History:  Procedure Laterality Date  APPENDECTOMY    Allergies  Allergen Reactions  Finasteride  Other (See Comments)  Other Reaction(s): blurred vision, nausea, depression  Methimazole Other (See Comments)   Current Outpatient Medications on File Prior to Visit  Medication Sig Dispense Refill  cyanocobalamin  (VITAMIN B12) 1000 MCG tablet 1 tablet Orally Once a day  methIMAzole (TAPAZOLE) 10 MG tablet Take 10 mg by mouth once daily  neomycin -polymyxin-dexAMETHasone  (MAXITROL) ophthalmic ointment APPLY 1/4 INCH INTO LEFT EYE THREE TIMES A DAY  pantoprazole  (PROTONIX ) 40 MG DR tablet 1 TABLET ORALLY 30 MINUTES BEFORE BREAKFAST 90 DAYS  predniSONE (DELTASONE) 10 MG tablet 4 TABS DAILY X 2 WEEKS, 3 TABS DAILY X 2 WEEKS, 2 TABS DAILY X 2 WEEKS, THEN 1 TAB DAILY  rosuvastatin  (CRESTOR ) 20 MG tablet Take 20 mg by mouth once daily   No current facility-administered medications on file prior to visit.   Family History  Problem Relation Age of Onset  High blood pressure (Hypertension) Mother  High blood pressure (Hypertension) Father    Social History   Tobacco Use  Smoking Status Former  Types: Cigarettes  Smokeless Tobacco Never    Social History   Socioeconomic History  Marital status: Divorced  Tobacco Use  Smoking status: Former  Types: Cigarettes  Smokeless tobacco: Never  Vaping Use  Vaping status: Never Used  Substance and Sexual Activity  Alcohol use: Never  Drug use: Never   Social Drivers of Health   Food Insecurity: Low Risk (12/24/2022)  Received from [facility]  Hunger Vital Sign  Within the past 12 months, you worried that your food would run out before you got  money to buy more: Never true  Within the past 12 months, the food you bought just didn't last and you didn't have money to get more. : Never true  Transportation Needs: No Transportation Needs (12/24/2022)  Received  from [employer]  In the past 12 months, has lack of reliable transportation kept you from medical appointments, meetings, work or from getting things needed for daily living? : No  Received from [employer]  Social Network  Housing Stability: Unknown (12/09/2023)  Housing Stability Vital Sign  Homeless in the Last Year: No   Objective:   Vitals:  BP: 135/71  Pulse: (!) 48  SpO2: 97%  Weight: 70.3 kg (155 lb)  Height: 181.6 cm (5' 11.5)  PainSc: 4  PainLoc: Throat   Body mass index is 21.32 kg/m.  Physical Exam   GENERAL APPEARANCE Comfortable, no acute issues Development: normal Gross deformities: none  SKIN Rash, lesions, ulcers: none Induration, erythema: none Nodules: none palpable  EYES Conjunctiva and lids: normal Pupils: equal  EARS, NOSE, MOUTH, THROAT External ears: no lesion or deformity External nose: no lesion or deformity Hearing: grossly normal  NECK Symmetric: yes Trachea: midline Thyroid : no palpable nodules in the thyroid  bed  CHEST/CV Not assessed  ABDOMEN Not assessed  GENITOURINARY/RECTAL Not assessed  MUSCULOSKELETAL Station and gait: normal Digits and nails: no clubbing or cyanosis Muscle strength: grossly normal all extremities Deformity: none  LYMPHATIC Cervical: none palpable Supraclavicular: none palpable  PSYCHIATRIC Oriented to person, place, and time: yes Mood and affect: normal for situation Judgment and insight: appropriate for situation   Assessment and Plan:   Graves' disease  Patient is referred by Dr. Reyes Alexander from endocrinology for surgical evaluation and management of Graves' disease with possible developing thyroid  eye disease.  Patient provided with a copy of The Thyroid  Book: Medical and Surgical Treatment of Thyroid  Problems, published by [employer], 16 pages. Book reviewed and explained to patient during visit today.  Today we reviewed his clinical history. We reviewed his  imaging studies including a nuclear medicine radioactive iodine uptake scan. We reviewed his recent laboratory studies. We discussed options for management of Graves' disease including treatment with radioactive iodine. Patient is anxious to proceed with thyroid  surgery. Today we discussed total thyroidectomy as the treatment of choice. We discussed the procedure. We discussed risk and benefits including the risk of recurrent laryngeal nerve injury and injury to parathyroid glands. We discussed the need for lifelong thyroid  hormone replacement following surgery. We discussed the size and location of the surgical incision. We discussed the hospital stay to be anticipated. The patient understands and wishes to proceed in the near future.   Krystal Spinner, MD [facility] practice Office: [phone]

## 2023-12-10 NOTE — Progress Notes (Addendum)
 COVID Vaccine Completed:  Date of COVID positive in last 90 days:  PCP - Charmaine Bright, PA Cardiologist - Darryle Decent, MD LOV 12/09/22  Chest x-ray - N/A EKG - 12/13/23 Epic/chart Stress Test - 08/28/20 Epic ECHO - 08/21/20 Epic Cardiac Cath - 01/18/13 [facility] Pacemaker/ICD device last checked:N/A Spinal Cord Stimulator:N/A  Bowel Prep - N/A  Sleep Study - N/A CPAP -   Fasting Blood Sugar - preDM long time ago per pt, no meds or checks at home Checks Blood Sugar _____ times a day  Last dose of GLP1 agonist-  N/A GLP1 instructions:  Do not take after     Last dose of SGLT-2 inhibitors-  N/A SGLT-2 instructions:  Do not take after     Blood Thinner Instructions: N/A Last dose:   Time: Aspirin  Instructions:N/A Last Dose:  Activity level: Can go up a flight of stairs and perform activities of daily living without stopping and without symptoms of chest pain or shortness of breath.  Anesthesia review: CAD with stents, heart attack  Patient denies shortness of breath, fever, cough and chest pain at PAT appointment  Patient verbalized understanding of instructions that were given to them at the PAT appointment. Patient was also instructed that they will need to review over the PAT instructions again at home before surgery.

## 2023-12-13 ENCOUNTER — Other Ambulatory Visit: Payer: Self-pay

## 2023-12-13 ENCOUNTER — Encounter (HOSPITAL_COMMUNITY)
Admission: RE | Admit: 2023-12-13 | Discharge: 2023-12-13 | Disposition: A | Discharge status: Home / Self Care | Source: Ambulatory Visit | Attending: Surgery | Admitting: Surgery

## 2023-12-13 ENCOUNTER — Encounter (HOSPITAL_COMMUNITY): Payer: Self-pay

## 2023-12-13 VITALS — BP 118/62 | HR 73 | Temp 98.1°F | Resp 14 | Ht 71.5 in | Wt 150.0 lb

## 2023-12-13 DIAGNOSIS — I252 Old myocardial infarction: Secondary | ICD-10-CM | POA: Insufficient documentation

## 2023-12-13 DIAGNOSIS — I6782 Cerebral ischemia: Secondary | ICD-10-CM | POA: Diagnosis not present

## 2023-12-13 DIAGNOSIS — Z01818 Encounter for other preprocedural examination: Secondary | ICD-10-CM | POA: Insufficient documentation

## 2023-12-13 DIAGNOSIS — E0500 Thyrotoxicosis with diffuse goiter without thyrotoxic crisis or storm: Secondary | ICD-10-CM | POA: Diagnosis not present

## 2023-12-13 DIAGNOSIS — I493 Ventricular premature depolarization: Secondary | ICD-10-CM | POA: Diagnosis not present

## 2023-12-13 DIAGNOSIS — Z955 Presence of coronary angioplasty implant and graft: Secondary | ICD-10-CM | POA: Insufficient documentation

## 2023-12-13 DIAGNOSIS — Z87891 Personal history of nicotine dependence: Secondary | ICD-10-CM | POA: Insufficient documentation

## 2023-12-13 DIAGNOSIS — I2510 Atherosclerotic heart disease of native coronary artery without angina pectoris: Secondary | ICD-10-CM | POA: Insufficient documentation

## 2023-12-13 DIAGNOSIS — Z8546 Personal history of malignant neoplasm of prostate: Secondary | ICD-10-CM | POA: Insufficient documentation

## 2023-12-13 DIAGNOSIS — E119 Type 2 diabetes mellitus without complications: Secondary | ICD-10-CM | POA: Insufficient documentation

## 2023-12-13 HISTORY — DX: Unspecified osteoarthritis, unspecified site: M19.90

## 2023-12-13 LAB — BASIC METABOLIC PANEL WITH GFR
Anion gap: 8 (ref 5–15)
BUN: 13 mg/dL (ref 8–23)
CO2: 26 mmol/L (ref 22–32)
Calcium: 9.5 mg/dL (ref 8.9–10.3)
Chloride: 104 mmol/L (ref 98–111)
Creatinine, Ser: 0.74 mg/dL (ref 0.61–1.24)
GFR, Estimated: >60 mL/min (ref >60)
Glucose, Bld: 95 mg/dL (ref 70–99)
Potassium: 4.1 mmol/L (ref 3.5–5.1)
Sodium: 138 mmol/L (ref 135–145)

## 2023-12-13 LAB — CBC
HCT: 41.9 % (ref 39.0–52.0)
Hemoglobin: 13.5 g/dL (ref 13.0–17.0)
MCH: 31.4 pg (ref 26.0–34.0)
MCHC: 32.2 g/dL (ref 30.0–36.0)
MCV: 97.4 fL (ref 80.0–100.0)
Platelets: 232 K/uL (ref 150–400)
RBC: 4.3 MIL/uL (ref 4.22–5.81)
RDW: 11.5 % (ref 11.5–15.5)
WBC: 7.3 K/uL (ref 4.0–10.5)
nRBC: 0 % (ref 0.0–0.2)

## 2023-12-13 NOTE — Progress Notes (Addendum)
 Case: [id number] Date/Time: 12/14/23 0715   Procedure: THYROIDECTOMY   Anesthesia type: General   Diagnosis: Graves' disease [E05.00]   Pre-op diagnosis: GRAVES DISEASE   Location: [facility] ROOM 04 / [facility]   Surgeons: John Boas, MD       DISCUSSION: John Spencer is a 73 yo male with PMH of former smoking, hx of MI, CAD s/p PCI x 2 to LAD and RCA, DM, Graves disease, prostate cancer s/p prostatectomy (2023), depression, arthritis.  Patient follows with Cardiology for hx of CAD s/p PCI x 2 to LAD and RCA in 12/2012 (in [place] ). Had Echo, stress test, event monitoring in 2022 which was unrevealing. Last seen by Dr. Barbaraann for dizziness/lightheadedness with possible associated bradycardia on 12/09/22. Echo was ordered which was not completed.   Activity level: Can go up a flight of stairs and perform activities of daily living without stopping and without symptoms of chest pain or shortness of breath..  Seen in the ED for eye discomfort, diplopia, and headaches. Seen by Neurology in f/u on 9/10 and MRI brain/orbits obtained which did not show acute findings.   Discussed with Dr. Darlyn. Recommend cardiac clearance prior to proceeding.  VS: BP 118/62   Pulse 73   Temp 36.7 C (Oral)   Resp 14   Ht 5' 11.5 (1.816 m)   Wt 68 kg   SpO2 98%   BMI 20.63 kg/m   PROVIDERS: John Pfeiffer, PA-C   LABS: Labs reviewed: Acceptable for surgery. (all labs ordered are listed, but only abnormal results are displayed)  Labs Reviewed  BASIC METABOLIC PANEL WITH GFR  CBC     IMAGES:  MRI Brain/Orbits 11/22/23:  IMPRESSION: MRI brain:   1.  No evidence of an acute intracranial abnormality. 2. Mild chronic small vessel ischemic changes within the cerebral white matter, slightly progressed from the prior MRI of 08/20/2020. 3. 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   MRI orbits:   1. Mildly thickened appearance of the right inferior  rectus muscle. Although nonspecific, this could reflect an early/mild manifestation of thyroid  eye disease given the provided history. 2. Prior left ocular lens replacement. 3. Otherwise unremarkable MRI appearance of the orbits. 4. Bilateral maxillary sinus disease, as described.  EKG 12/13/23:  Normal sinus rhythm Cannot rule out Anteroseptal infarct , age undetermined  CV:  14 day extended Holter monitor: Dominant rhythm normal sinus. Heart rate 41-146 bpm.  Avg HR 61 bpm. No atrial fibrillation, ventricular tachycardia, high grade AV block, pauses (3 seconds or longer). Total ventricular ectopic burden 3.3%. Total supraventricular ectopic burden <1%. Patient triggered events: 11.  Underlying rhythm normal sinus with frequent PVCs.    Exercise Myoview stress test 08/28/2020: 1 Day Rest/Stress Protocol. Exercise 9 minutes 1 second on Bruce protocol, achieved 10.17 METS, and 85% of age-predicted maximum heart rate. Stress ECG negative for ischemia.  Normal myocardial perfusion without convincing evidence of reversible myocardial ischemia or prior infarct. Left ventricular wall thickness is preserved without regional wall motion abnormalities. LVEF 51%, visually appears to be preserved No prior studies for comparison. Low risk study.  Echocardiogram 08/21/2020: Normal LV systolic function with visual EF 55-60%. Left ventricle cavity is normal in size. Mild left ventricular hypertrophy. Normal global wall motion. Indeterminate diastolic filling pattern, normal LAP. Left atrial cavity is mildly dilated. Mild (Grade I) mitral regurgitation. The aortic root is dilated, sinotubular junction 3.8cm. IVC is normal with a respiratory response of <50%. No prior study for comparison.  Catheterization 01/18/13   showed lesions in LAD and right coronary artery, each treated with two DES (Resolute) stents.      Past Medical History:  Diagnosis Date   Arthritis    At average risk  for colon cancer    BPH (benign prostatic hyperplasia)    Cancer (HCC)    Coronary artery disease    Prior PCI x4, per patient.   Depression    Diabetes mellitus without complication (HCC)    pre DM years ago   Heart attack [facility])    Internal carotid artery stenosis, left    Pneumonia     Past Surgical History:  Procedure Laterality Date   APPENDECTOMY     COLONOSCOPY     XI ROBOTIC ASSISTED SIMPLE PROSTATECTOMY N/A 05/09/2021   Procedure: XI ROBOTIC ASSISTED SIMPLE PROSTATECTOMY;  Surgeon: John Hummer, MD;  Location: [facility];  Service: Urology;  Laterality: N/A;    MEDICATIONS:  Cyanocobalamin  (VITAMIN B12) 1000 MCG TABS   predniSONE (DELTASONE) 10 MG tablet   Selenium 100 MCG CAPS   No current facility-administered medications for this encounter.   John Spencer John Spencer/[facility] Short Stay/Anesthesiology [facility] Phone [phone] 12/13/2023 10:29 AM

## 2023-12-14 ENCOUNTER — Encounter (HOSPITAL_COMMUNITY): Payer: Self-pay | Admitting: Anesthesiology

## 2023-12-14 ENCOUNTER — Encounter (HOSPITAL_COMMUNITY): Payer: Self-pay | Admitting: Medical

## 2023-12-14 ENCOUNTER — Ambulatory Visit (HOSPITAL_COMMUNITY): Admission: RE | Admit: 2023-12-14 | Source: Home / Self Care | Admitting: Surgery

## 2023-12-14 ENCOUNTER — Telehealth: Payer: Self-pay

## 2023-12-14 ENCOUNTER — Encounter (HOSPITAL_COMMUNITY): Admission: RE | Payer: Self-pay | Source: Home / Self Care

## 2023-12-14 DIAGNOSIS — E0500 Thyrotoxicosis with diffuse goiter without thyrotoxic crisis or storm: Secondary | ICD-10-CM | POA: Diagnosis present

## 2023-12-14 SURGERY — THYROIDECTOMY
Anesthesia: General

## 2023-12-14 NOTE — Telephone Encounter (Signed)
 Patient has an appointment on 9/23 clearance can be addressed at office visit

## 2023-12-14 NOTE — Telephone Encounter (Signed)
   Name: John Spencer  DOB: 1950-07-26  MRN: [id number]  Primary Cardiologist: None  Chart reviewed as part of pre-operative protocol coverage. Because of [name] past medical history and time since last visit, he will require a follow-up in-office visit in order to better assess preoperative cardiovascular risk. Has not had appointment with Cardiology in >1 year.  Pre-op covering staff: - Please schedule appointment and call patient to inform them. If patient already had an upcoming appointment within acceptable timeframe, please add pre-op clearance to the appointment notes so provider is aware. - Please contact requesting surgeon's office via preferred method (i.e, phone, fax) to inform them of need for appointment prior to surgery.  Kacie Huxtable E Keyonta Madrid, NP  12/14/2023, 11:55 AM

## 2023-12-14 NOTE — Telephone Encounter (Signed)
 Preop clearance now scheduled for 12/17/23 with Reche Finder, NP

## 2023-12-14 NOTE — Telephone Encounter (Signed)
   Pre-operative Risk Assessment    Patient Name: John Spencer  DOB: 1951/02/08 MRN: [id number]   Date of last office visit: 12/09/22 Date of next office visit: Not scheduled   Request for Surgical Clearance    Procedure:  Thyroidectomy   Date of Surgery:  Clearance TBD                                Surgeon: Dr. Krystal Spinner Surgeon's Group or Practice Name:  [facility] Phone number:  [phone] Fax number:  [phone]   Type of Clearance Requested:   - Medical    Type of Anesthesia:  General    Additional requests/questions:    Bonney Ival LOISE Gerome   12/14/2023, 10:56 AM

## 2023-12-17 ENCOUNTER — Encounter (HOSPITAL_BASED_OUTPATIENT_CLINIC_OR_DEPARTMENT_OTHER): Payer: Self-pay | Admitting: Family

## 2023-12-17 ENCOUNTER — Ambulatory Visit (HOSPITAL_BASED_OUTPATIENT_CLINIC_OR_DEPARTMENT_OTHER): Admitting: Family

## 2023-12-17 VITALS — BP 126/60 | HR 76 | Ht 71.5 in | Wt 152.0 lb

## 2023-12-17 DIAGNOSIS — I2510 Atherosclerotic heart disease of native coronary artery without angina pectoris: Secondary | ICD-10-CM

## 2023-12-17 DIAGNOSIS — Z01810 Encounter for preprocedural cardiovascular examination: Secondary | ICD-10-CM

## 2023-12-17 DIAGNOSIS — R001 Bradycardia, unspecified: Secondary | ICD-10-CM

## 2023-12-17 DIAGNOSIS — E782 Mixed hyperlipidemia: Secondary | ICD-10-CM

## 2023-12-17 NOTE — Patient Instructions (Signed)
 Medication Instructions:  No changaes *If you need a refill on your cardiac medications before your next appointment, please call your pharmacy*  Lab Work: Return for fasting blood work (lipid panel)  If you have labs (blood work) drawn today and your tests are completely normal, you will receive your results only by: MyChart Message (if you have MyChart) OR A paper copy in the mail If you have any lab test that is abnormal or we need to change your treatment, we will call you to review the results.  Testing/Procedures: none  Follow-Up: At [facility], you and your health needs are our priority.  As part of our continuing mission to provide you with exceptional heart care, our providers are all part of one team.  This team includes your primary Cardiologist (physician) and Advanced Practice Providers or APPs (Physician Assistants and Nurse Practitioners) who all work together to provide you with the care you need, when you need it.  Your next appointment:   12 month(s)  Provider:   Darryle Decent, MD or Reche Finder, NP

## 2023-12-17 NOTE — Progress Notes (Signed)
 Cardiology Office Note   Date:  12/17/2023  ID:  John Spencer, DOB 03/13/1951, MRN [id number] PCP: Barbaraann Darryle Ned, MD  [facility] Providers Cardiologist:  None     History of Present Illness John Spencer is a 73 y.o. male with history of CAD with prior DES-LAD and DES-RCA in 2014 (PCIx4 per patient history), sinus bradycardia, diabetes, vertigo, hyperlipidemia  Carotid duplex 07/2020 with minimal R ICA stenosis and less than 50% LICA stenosis. Negative MPI 08/2020. ZIO 08/2020 with average HR 61 bpm with PVC burden 3.3%.   He was last seen 12/09/2022 at the request of PCP due to sinus bradycardia.  He was able to tolerate high level of activity playing pickle ball 5 days/week without issue.  He was not on AV nodal blocking agents.  His lightheaded dizziness was suspected due to an inner ear issue and was overall improving.  Echocardiogram recommended but not performed.  At that time was recommended to increase Crestor  dose but he declined.  Presents today for preoperative clearance for thyroidectomy. 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 He remains physically active doing pickle ball at the [facility], [place], and [employer]. He denies chest pain, pressure, heaviness, tightness, shortness of breath, lightheadedness, dizziness, weight gain,  palpitations. Mild LE edema with sock line bilaterally that is not bothersome.   ROS: Please see the history of present illness.    All other systems reviewed and are negative.   Studies Reviewed EKG Interpretation Date/Time:  Friday December 17 2023 11:02:32 EDT Ventricular Rate:  76 PR Interval:  168 QRS Duration:  90 QT Interval:  350 QTC Calculation: 393 R Axis:   0  Text Interpretation: Normal sinus rhythm  Stable prior anteroseptal infarct. Confirmed by Vannie Mora ([id number]) on 12/17/2023 1:36:59 PM    Cardiac Studies & Procedures    ______________________________________________________________________________________________   STRESS TESTS  PCV MYOCARDIAL PERFUSION WO LEXISCAN 08/28/2020  Narrative Exercise Myoview stress test 08/28/2020: 1 Day Rest/Stress Protocol. Exercise 9 minutes 1 second on Bruce protocol, achieved 10.17 METS, and 85% of age-predicted maximum heart rate. Stress ECG negative for ischemia. Normal myocardial perfusion without convincing evidence of reversible myocardial ischemia or prior infarct. Left ventricular wall thickness is preserved without regional wall motion abnormalities. LVEF 51%, visually appears to be preserved No prior studies for comparison. Low risk study.   ECHOCARDIOGRAM  PCV ECHOCARDIOGRAM COMPLETE 08/21/2020  Narrative Echocardiogram 08/21/2020: Normal LV systolic function with visual EF 55-60%. Left ventricle cavity is normal in size. Mild left ventricular hypertrophy. Normal global wall motion. Indeterminate diastolic filling pattern, normal LAP. Left atrial cavity is mildly dilated. Mild (Grade I) mitral regurgitation. The aortic root is dilated, sinotubular junction 3.8cm. IVC is normal with a respiratory response of <50%. No prior study for comparison.    MONITORS  LONG TERM MONITOR (3-14 DAYS) 09/19/2020  Narrative 14 day extended Holter monitor: Dominant rhythm normal sinus. Heart rate 41-146 bpm.  Avg HR 61 bpm. No atrial fibrillation, ventricular tachycardia, high grade AV block, pauses (3 seconds or longer). Total ventricular ectopic burden 3.3%. Total supraventricular ectopic burden <1%. Patient triggered events: 11.  Underlying rhythm normal sinus with frequent PVCs.       ______________________________________________________________________________________________      Risk Assessment/Calculations           Physical Exam VS:  BP 126/60   Pulse 76   Ht 5' 11.5 (1.816 m)   Wt 152 lb (68.9 kg)   SpO2 97%   BMI  20.90 kg/m         Wt Readings from Last 3 Encounters:  12/17/23 152 lb (68.9 kg)  12/13/23 150 lb (68 kg)  12/08/23 155 lb (70.3 kg)    GEN: Well nourished, well developed in no acute distress NECK: No JVD; No carotid bruits CARDIAC: RRR, no murmurs, rubs, gallops, radial and PT pulses 2+ bilaterally RESPIRATORY:  Clear to auscultation without rales, wheezing or rhonchi  ABDOMEN: Soft, non-tender, non-distended EXTREMITIES:  mild bilateral non-pitting LE edema; No deformity   ASSESSMENT AND PLAN  Preop - According to the Revised Cardiac Risk Index (RCRI), his Perioperative Risk of Major Cardiac Event is (%): 6.6. His Functional Capacity in METs is: 8.33 according to the [facility] Activity Status Index (DASI). Per AHA/ACC guidelines, he is deemed acceptable risk for the planned procedure without additional cardiovascular testing. Will route to surgical team so they are aware.   He is aware to HOLD fish oil OTC 7 days prior to surgery  Not on antiplatelet nor anticoagulant that would need held.  Clearance note sent to Dr. Drema   CAD / HLD, LDL goal < 70 - EKG with SR @ 76, stable from previous; exercises regularly without anginal symptoms; No indication for ischemia evaluation.  Last LDL in 07/2022 was 119. Previously declined escalation statin therapy. Previously has taken atorvastatin and rosuvastatin , no longer taking statin, unclear why. Will update lipid panel - he will return when fasting  GMDT: Defer BB due to prior bradycardia. Reassess statin based on lipid results.   Bradycardia - none noted today. Prior monitor average HR 61 bpm. Recommend avoidance of AV nodal blocking agents. Denies lightheadedness, dizziness.    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 Recommend leg elevation, compression socks. Consider echocardiogram if symptoms of  shortness of breath or persistent edema arise    Dispo: 1 year  Signed, Reche GORMAN Finder, NP

## 2023-12-21 ENCOUNTER — Ambulatory Visit: Admitting: General Practice

## 2023-12-23 DIAGNOSIS — H04123 Dry eye syndrome of bilateral lacrimal glands: Secondary | ICD-10-CM | POA: Diagnosis not present

## 2023-12-23 DIAGNOSIS — E079 Disorder of thyroid, unspecified: Secondary | ICD-10-CM | POA: Diagnosis not present

## 2023-12-24 LAB — ACHR ALL WITH REFLEX TO MUSK
AChR Binding Ab, Serum: <0.07 nmol/L (ref 0.00–0.24)
AChR-modulating Ab: 0 % (ref 0–45)
Acetylchol Block Ab: 19 % (ref 0–25)

## 2023-12-24 LAB — MUSK ANTIBODIES: MuSK Antibodies: <1 U/mL

## 2023-12-25 ENCOUNTER — Ambulatory Visit: Payer: Self-pay | Admitting: Neurology

## 2024-01-03 NOTE — Patient Instructions (Signed)
 SURGICAL WAITING ROOM VISITATION Patients having surgery or a procedure may have no more than 2 support people in the waiting area - these visitors may rotate in the visitor waiting room.   Due to an increase in RSV and influenza rates and associated hospitalizations, children ages 81 and under may not visit patients in [facility] hospitals. If the patient needs to stay at the hospital during part of their recovery, the visitor guidelines for inpatient rooms apply.  PRE-OP VISITATION  Pre-op nurse will coordinate an appropriate time for 1 support person to accompany the patient in pre-op.  This support person may not rotate.  This visitor will be contacted when the time is appropriate for the visitor to come back in the pre-op area.  Please refer to the [facility] website for the visitor guidelines for Inpatients (after your surgery is over and you are in a regular room).  You are not required to quarantine at this time prior to your surgery. However, you must do this: Hand Hygiene often Do NOT share personal items Notify your provider if you are in close contact with someone who has COVID or you develop fever 100.4 or greater, new onset of sneezing, cough, sore throat, shortness of breath or body aches.  If you test positive for Covid or have been in contact with anyone that has tested positive in the last 10 days please notify you surgeon.    Your procedure is scheduled on:  01/13/24  Report to [facility] Main Entrance: [place] entrance where the [employer] is available.   Report to admitting at: 5:15 AM  Call this number if you have any questions or problems the morning of surgery [phone]  FOLLOW ANY ADDITIONAL PRE OP INSTRUCTIONS YOU RECEIVED FROM YOUR SURGEON'S OFFICE!!!  Do not eat food after Midnight the night prior to your surgery/procedure.  After Midnight you may have the following liquids until: 4:30 AM DAY OF SURGERY  Clear Liquid Diet Water  Black  Coffee (sugar ok, NO MILK/CREAM OR CREAMERS)  Tea (sugar ok, NO MILK/CREAM OR CREAMERS) regular and decaf                             Plain Jell-O  with no fruit (NO RED)                                           Fruit ices (not with fruit pulp, NO RED)                                     Popsicles (NO RED)                                                                  Juice: NO CITRUS JUICES: only apple, WHITE grape, WHITE cranberry Sports drinks like Gatorade or Powerade (NO RED)    Oral Hygiene is also important to reduce your risk of infection.        Remember - BRUSH YOUR TEETH THE MORNING OF SURGERY WITH YOUR REGULAR  TOOTHPASTE  Do NOT smoke after Midnight the night before surgery.  STOP TAKING all Vitamins, Herbs and supplements 1 week before your surgery.   Take ONLY these medicines the morning of surgery with A SIP OF WATER : NONE.  If You have been diagnosed with Sleep Apnea - Bring CPAP mask and tubing day of surgery. We will provide you with a CPAP machine on the day of your surgery.                   You may not have any metal on your body including hair pins, jewelry, and body piercing  Do not wear lotions, powders, perfumes / cologne, or deodorant  Men may shave face and neck.  Contacts, Hearing Aids, dentures or bridgework may not be worn into surgery. DENTURES WILL BE REMOVED PRIOR TO SURGERY PLEASE DO NOT APPLY Poly grip OR ADHESIVES!!!  You may bring a small overnight bag with you on the day of surgery, only pack items that are not valuable. [facility] IS NOT RESPONSIBLE   FOR VALUABLES THAT ARE LOST OR STOLEN.   Patients discharged on the day of surgery will not be allowed to drive home.  Someone NEEDS to stay with you for the first 24 hours after anesthesia.  Do not bring your home medications to the hospital. The Pharmacy will dispense medications listed on your medication list to you during your admission in the Hospital.  Special Instructions: Bring a copy  of your healthcare power of attorney and living will documents the day of surgery, if you wish to have them scanned into your [facility]- EPIC  Please read over the following fact sheets you were given: IF YOU HAVE QUESTIONS ABOUT YOUR PRE-OP INSTRUCTIONS, PLEASE CALL [phone]   [facility] - Preparing for Surgery Before surgery, you can play an important role.  Because skin is not sterile, your skin needs to be as free of germs as possible.  You can reduce the number of germs on your skin by washing with CHG (chlorahexidine gluconate) soap before surgery.  CHG is an antiseptic cleaner which kills germs and bonds with the skin to continue killing germs even after washing. Please DO NOT use if you have an allergy to CHG or antibacterial soaps.  If your skin becomes reddened/irritated stop using the CHG and inform your nurse when you arrive at Short Stay. Do not shave (including legs and underarms) for at least 48 hours prior to the first CHG shower.  You may shave your face/neck.  PATIENT SIGNATURE_________________________________  NURSE SIGNATURE__________________________________  ________________________________________________________________________  [facility] - Preparing for Surgery Before surgery, you can play an important role.  Because skin is not sterile, your skin needs to be as free of germs as possible.  You can reduce the number of germs on your skin by washing with CHG (chlorahexidine gluconate) soap before surgery.  CHG is an antiseptic cleaner which kills germs and bonds with the skin to continue killing germs even after washing. Please DO NOT use if you have an allergy to CHG or antibacterial soaps.  If your skin becomes reddened/irritated stop using the CHG and inform your nurse when you arrive at Short Stay. Do not shave (including legs and underarms) for at least 48 hours prior to the first CHG shower.  You may shave your face/neck.  Please follow these  instructions carefully:  1.  Shower with CHG Soap the night before surgery ONLY (DO NOT USE THE SOAP THE MORNING OF  SURGERY).  2.  If you choose to wash your hair, wash your hair first as usual with your normal  shampoo.  3.  After you shampoo, rinse your hair and body thoroughly to remove the shampoo.                             4.  Use CHG as you would any other liquid soap.  You can apply chg directly to the skin and wash.  Gently with a scrungie or clean washcloth.  5.  Apply the CHG Soap to your body ONLY FROM THE NECK DOWN.   Do   not use on face/ open                           Wound or open sores. Avoid contact with eyes, ears mouth and   genitals (private parts).                       Wash face,  Genitals (private parts) with your normal soap.             6.  Wash thoroughly, paying special attention to the area where your    surgery  will be performed.  7.  Thoroughly rinse your body with warm water  from the neck down.  8.  DO NOT shower/wash with your normal soap after using and rinsing off the CHG Soap.                9.  Pat yourself dry with a clean towel.            10.  Wear clean pajamas.            11.  Place clean sheets on your bed the night of your first shower and do not  sleep with pets. Day of Surgery : Do not apply any CHG, lotions/deodorants the morning of surgery.  Please wear clean clothes to the hospital/surgery center.  FAILURE TO FOLLOW THESE INSTRUCTIONS MAY RESULT IN THE CANCELLATION OF YOUR SURGERY  PATIENT SIGNATURE_________________________________  NURSE SIGNATURE__________________________________  ________________________________________________________________________

## 2024-01-05 ENCOUNTER — Encounter (HOSPITAL_COMMUNITY): Payer: Self-pay

## 2024-01-05 ENCOUNTER — Other Ambulatory Visit: Payer: Self-pay

## 2024-01-05 ENCOUNTER — Encounter (HOSPITAL_COMMUNITY)
Admission: RE | Admit: 2024-01-05 | Discharge: 2024-01-05 | Disposition: A | Discharge status: Home / Self Care | Source: Ambulatory Visit | Attending: Surgery | Admitting: Surgery

## 2024-01-05 ENCOUNTER — Ambulatory Visit (HOSPITAL_COMMUNITY)
Admission: RE | Admit: 2024-01-05 | Discharge: 2024-01-05 | Disposition: A | Discharge status: Home / Self Care | Source: Ambulatory Visit | Attending: Anesthesiology | Admitting: Anesthesiology

## 2024-01-05 VITALS — BP 128/63 | HR 70 | Temp 97.6°F | Ht 71.5 in | Wt 150.0 lb

## 2024-01-05 DIAGNOSIS — Z01818 Encounter for other preprocedural examination: Secondary | ICD-10-CM | POA: Diagnosis not present

## 2024-01-05 DIAGNOSIS — E119 Type 2 diabetes mellitus without complications: Secondary | ICD-10-CM | POA: Diagnosis not present

## 2024-01-05 DIAGNOSIS — I2510 Atherosclerotic heart disease of native coronary artery without angina pectoris: Secondary | ICD-10-CM | POA: Diagnosis not present

## 2024-01-05 HISTORY — DX: Prediabetes: R73.03

## 2024-01-05 LAB — BASIC METABOLIC PANEL WITH GFR
Anion gap: 10 (ref 5–15)
BUN: 11 mg/dL (ref 8–23)
CO2: 25 mmol/L (ref 22–32)
Calcium: 9.2 mg/dL (ref 8.9–10.3)
Chloride: 106 mmol/L (ref 98–111)
Creatinine, Ser: 0.88 mg/dL (ref 0.61–1.24)
GFR, Estimated: >60 mL/min (ref >60)
Glucose, Bld: 97 mg/dL (ref 70–99)
Potassium: 3.9 mmol/L (ref 3.5–5.1)
Sodium: 141 mmol/L (ref 135–145)

## 2024-01-05 LAB — HEMOGLOBIN A1C
Hgb A1c MFr Bld: 5.2 % (ref 4.8–5.6)
Mean Plasma Glucose: 102.54 mg/dL

## 2024-01-05 LAB — CBC
HCT: 42.3 % (ref 39.0–52.0)
Hemoglobin: 13.5 g/dL (ref 13.0–17.0)
MCH: 30.9 pg (ref 26.0–34.0)
MCHC: 31.9 g/dL (ref 30.0–36.0)
MCV: 96.8 fL (ref 80.0–100.0)
Platelets: 226 K/uL (ref 150–400)
RBC: 4.37 MIL/uL (ref 4.22–5.81)
RDW: 11.9 % (ref 11.5–15.5)
WBC: 6.4 K/uL (ref 4.0–10.5)
nRBC: 0 % (ref 0.0–0.2)

## 2024-01-05 NOTE — Progress Notes (Addendum)
 For Anesthesia: PCP - Barbaraann Darryle Ned, MD  Cardiologist - NONE Clearance: Vannie Reche RAMAN, NP  : 12/17/23 Bowel Prep reminder:  Chest x-ray - 01/05/24 EKG - 12/13/23 Stress Test -  ECHO - 08/21/20 Cardiac Cath -  Pacemaker/ICD device last checked: Pacemaker orders received: Device Rep notified:  Spinal Cord Stimulator:N/A  Sleep Study - N/A CPAP -   Fasting Blood Sugar - N/A Checks Blood Sugar _____ times a day Date and result of last Hgb A1c-  Last dose of GLP1 agonist- N/A GLP1 instructions: Hold 7 days prior to schedule (Hold 24 hours-daily)   Last dose of SGLT-2 inhibitors- N/A SGLT-2 instructions: Hold 72 hours prior to surgery  Blood Thinner Instructions:N/A Last Dose: Time last taken:  Aspirin  Instructions:N/A Last Dose: Time last taken:  Activity level: Can go up a flight of stairs and activities of daily living without stopping and without chest pain and/or shortness of breath   Able to exercise without chest pain and/or shortness of breath  Anesthesia review: Hx: CAD,DIA,Heart attack.  Patient denies shortness of breath, fever, cough and chest pain at PAT appointment   Patient verbalized understanding of instructions that were reviewed over the telephone.

## 2024-01-10 ENCOUNTER — Encounter (HOSPITAL_COMMUNITY): Payer: Self-pay | Admitting: Surgery

## 2024-01-10 NOTE — H&P (Signed)
 REFERRING PHYSICIAN: Faythe Reyes RAMAN, MD  PROVIDER: Emmalyne Giacomo OZELL SPINNER, MD   Chief Complaint: New Consultation Alvia' disease)  History of Present Illness:  Patient is referred by Dr. Reyes Faythe from endocrinology for surgical evaluation and management of newly diagnosed Graves' disease. Patient's primary care provider is Charmaine Bright, [place]. Patient first noted weight loss approximately 3 to 4 months ago. He was evaluated and found to have an undetectable TSH level. Additional testing demonstrated evidence of Graves' disease, including a nuclear medicine thyroid  scan in early September. Patient has noted visual changes. He complains of blurred vision. He complains of eye discomfort. He has had problems with double vision. He has been seen by his eye care professional. 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 Patient was placed on methimazole but this was poorly tolerated. He is currently on prednisone. Patient presents today to discuss thyroid  surgery for treatment of Graves' disease. Patient does have a family history of thyroid  disease and his daughter and his nephew, both of whom had some form of malignancy. Patient has had no prior thyroid  surgery. 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.  Review of Systems: A complete review of systems was obtained from the patient. I have reviewed this information and discussed as appropriate with the patient. See HPI as well for other ROS.  Review of Systems  Constitutional: Positive for weight loss.  HENT: Negative.  Eyes: Positive for blurred vision, double vision and pain.  Respiratory: Negative.  Cardiovascular: Negative.  Gastrointestinal: Negative.  Genitourinary: Negative.  Musculoskeletal: Negative.  Skin: Negative.  Neurological: Positive for tremors.  Endo/Heme/Allergies: Negative.  Psychiatric/Behavioral: Negative.    Medical History: Past Medical History:  Diagnosis Date  Arthritis    Patient Active Problem List  Diagnosis  Graves' disease   Past Surgical History:  Procedure Laterality Date  APPENDECTOMY    Allergies  Allergen Reactions  Finasteride  Other (See Comments)  Other Reaction(s): blurred vision, nausea, depression  Methimazole Other (See Comments)   Current Outpatient Medications on File Prior to Visit  Medication Sig Dispense Refill  cyanocobalamin  (VITAMIN B12) 1000 MCG tablet 1 tablet Orally Once a day  methIMAzole (TAPAZOLE) 10 MG tablet Take 10 mg by mouth once daily  neomycin -polymyxin-dexAMETHasone  (MAXITROL) ophthalmic ointment APPLY 1/4 INCH INTO LEFT EYE THREE TIMES A DAY  pantoprazole  (PROTONIX ) 40 MG DR tablet 1 TABLET ORALLY 30 MINUTES BEFORE BREAKFAST 90 DAYS  predniSONE (DELTASONE) 10 MG tablet 4 TABS DAILY X 2 WEEKS, 3 TABS DAILY X 2 WEEKS, 2 TABS DAILY X 2 WEEKS, THEN 1 TAB DAILY  rosuvastatin  (CRESTOR ) 20 MG tablet Take 20 mg by mouth once daily   No current facility-administered medications on file prior to visit.   Family History  Problem Relation Age of Onset  High blood pressure (Hypertension) Mother  High blood pressure (Hypertension) Father    Social History   Tobacco Use  Smoking Status Former  Types: Cigarettes  Smokeless Tobacco Never    Social History   Socioeconomic History  Marital status: Divorced  Tobacco Use  Smoking status: Former  Types: Cigarettes  Smokeless tobacco: Never  Vaping Use  Vaping status: Never Used  Substance and Sexual Activity  Alcohol use: Never  Drug use: Never   Social Drivers of Health   Food Insecurity: Low Risk (12/24/2022)  Received from [facility]  Hunger Vital Sign  Within the past 12 months, you worried that your food would run out before you got  money to buy more: Never true  Within the past 12 months, the food you bought just didn't last and you didn't have money to get more. : Never true  Transportation Needs: No Transportation Needs (12/24/2022)  Received  from [employer]  In the past 12 months, has lack of reliable transportation kept you from medical appointments, meetings, work or from getting things needed for daily living? : No  Received from [employer]  Social Network  Housing Stability: Unknown (12/09/2023)  Housing Stability Vital Sign  Homeless in the Last Year: No   Objective:   Vitals:  BP: 135/71  Pulse: (!) 48  SpO2: 97%  Weight: 70.3 kg (155 lb)  Height: 181.6 cm (5' 11.5)  PainSc: 4  PainLoc: Throat   Body mass index is 21.32 kg/m.  Physical Exam   GENERAL APPEARANCE Comfortable, no acute issues Development: normal Gross deformities: none  SKIN Rash, lesions, ulcers: none Induration, erythema: none Nodules: none palpable  EYES Conjunctiva and lids: normal Pupils: equal  EARS, NOSE, MOUTH, THROAT External ears: no lesion or deformity External nose: no lesion or deformity Hearing: grossly normal  NECK Symmetric: yes Trachea: midline Thyroid : no palpable nodules in the thyroid  bed  CHEST/CV Not assessed  ABDOMEN Not assessed  GENITOURINARY/RECTAL Not assessed  MUSCULOSKELETAL Station and gait: normal Digits and nails: no clubbing or cyanosis Muscle strength: grossly normal all extremities Deformity: none  LYMPHATIC Cervical: none palpable Supraclavicular: none palpable  PSYCHIATRIC Oriented to person, place, and time: yes Mood and affect: normal for situation Judgment and insight: appropriate for situation   Assessment and Plan:   Graves' disease  Patient is referred by Dr. Reyes Alexander from endocrinology for surgical evaluation and management of Graves' disease with possible developing thyroid  eye disease.  Patient provided with a copy of The Thyroid  Book: Medical and Surgical Treatment of Thyroid  Problems, published by [employer], 16 pages. Book reviewed and explained to patient during visit today.  Today we reviewed his clinical history. We reviewed his  imaging studies including a nuclear medicine radioactive iodine uptake scan. We reviewed his recent laboratory studies. We discussed options for management of Graves' disease including treatment with radioactive iodine. Patient is anxious to proceed with thyroid  surgery. Today we discussed total thyroidectomy as the treatment of choice. We discussed the procedure. We discussed risk and benefits including the risk of recurrent laryngeal nerve injury and injury to parathyroid glands. We discussed the need for lifelong thyroid  hormone replacement following surgery. We discussed the size and location of the surgical incision. We discussed the hospital stay to be anticipated. The patient understands and wishes to proceed in the near future.   Krystal Spinner, MD [facility] practice Office: [phone]

## 2024-01-13 ENCOUNTER — Encounter (HOSPITAL_COMMUNITY): Payer: Self-pay | Admitting: Surgery

## 2024-01-13 ENCOUNTER — Encounter (HOSPITAL_COMMUNITY)
Admission: RE | Disposition: A | Discharge status: Home / Self Care | Payer: Self-pay | Source: Home / Self Care | Attending: Surgery

## 2024-01-13 ENCOUNTER — Ambulatory Visit (HOSPITAL_BASED_OUTPATIENT_CLINIC_OR_DEPARTMENT_OTHER): Payer: Self-pay | Admitting: Registered Nurse

## 2024-01-13 ENCOUNTER — Ambulatory Visit (HOSPITAL_COMMUNITY)
Admission: RE | Admit: 2024-01-13 | Discharge: 2024-01-14 | Disposition: A | Discharge status: Home / Self Care | Attending: Surgery | Admitting: Surgery

## 2024-01-13 ENCOUNTER — Other Ambulatory Visit: Payer: Self-pay

## 2024-01-13 ENCOUNTER — Ambulatory Visit (HOSPITAL_COMMUNITY): Payer: Self-pay | Admitting: Medical

## 2024-01-13 DIAGNOSIS — E0500 Thyrotoxicosis with diffuse goiter without thyrotoxic crisis or storm: Secondary | ICD-10-CM | POA: Diagnosis not present

## 2024-01-13 DIAGNOSIS — D34 Benign neoplasm of thyroid gland: Secondary | ICD-10-CM | POA: Diagnosis not present

## 2024-01-13 DIAGNOSIS — Z8249 Family history of ischemic heart disease and other diseases of the circulatory system: Secondary | ICD-10-CM | POA: Diagnosis not present

## 2024-01-13 DIAGNOSIS — Z8349 Family history of other endocrine, nutritional and metabolic diseases: Secondary | ICD-10-CM | POA: Diagnosis not present

## 2024-01-13 DIAGNOSIS — I252 Old myocardial infarction: Secondary | ICD-10-CM | POA: Diagnosis not present

## 2024-01-13 DIAGNOSIS — I2510 Atherosclerotic heart disease of native coronary artery without angina pectoris: Secondary | ICD-10-CM | POA: Diagnosis not present

## 2024-01-13 DIAGNOSIS — Z87891 Personal history of nicotine dependence: Secondary | ICD-10-CM | POA: Diagnosis not present

## 2024-01-13 DIAGNOSIS — M1990 Unspecified osteoarthritis, unspecified site: Secondary | ICD-10-CM | POA: Insufficient documentation

## 2024-01-13 HISTORY — PX: THYROIDECTOMY: SHX17

## 2024-01-13 SURGERY — THYROIDECTOMY
Anesthesia: General

## 2024-01-13 MED ORDER — FENTANYL CITRATE (PF) 100 MCG/2ML IJ SOLN
INTRAMUSCULAR | Status: AC
  Filled 2024-01-13: qty 2

## 2024-01-13 MED ORDER — OXYCODONE HCL 5 MG PO TABS: 5.0000 mg | ORAL_TABLET | ORAL | Status: DC | PRN

## 2024-01-13 MED ORDER — HEMOSTATIC AGENTS (NO CHARGE) OPTIME
TOPICAL | Status: DC | PRN
  Administered 2024-01-13: 1

## 2024-01-13 MED ORDER — FENTANYL CITRATE (PF) 50 MCG/ML IJ SOSY
PREFILLED_SYRINGE | INTRAMUSCULAR | Status: AC
  Filled 2024-01-13: qty 2

## 2024-01-13 MED ORDER — DROPERIDOL 2.5 MG/ML IJ SOLN: 0.6250 mg | Freq: Once | INTRAMUSCULAR | Status: DC | PRN

## 2024-01-13 MED ORDER — ACETAMINOPHEN 10 MG/ML IV SOLN
INTRAVENOUS | Status: DC | PRN
  Administered 2024-01-13: 1000 mg via INTRAVENOUS

## 2024-01-13 MED ORDER — EPHEDRINE SULFATE-NACL 50-0.9 MG/10ML-% IV SOSY
PREFILLED_SYRINGE | INTRAVENOUS | Status: DC | PRN
  Administered 2024-01-13: 5 mg via INTRAVENOUS

## 2024-01-13 MED ORDER — 0.9 % SODIUM CHLORIDE (POUR BTL) OPTIME
TOPICAL | Status: DC | PRN
  Administered 2024-01-13: 1000 mL

## 2024-01-13 MED ORDER — EPHEDRINE 5 MG/ML INJ
INTRAVENOUS | Status: AC
  Filled 2024-01-13: qty 5

## 2024-01-13 MED ORDER — ONDANSETRON HCL 4 MG/2ML IJ SOLN
INTRAMUSCULAR | Status: AC
  Filled 2024-01-13: qty 2

## 2024-01-13 MED ORDER — PROPOFOL 10 MG/ML IV BOLUS
INTRAVENOUS | Status: AC
  Filled 2024-01-13: qty 20

## 2024-01-13 MED ORDER — TRAMADOL HCL 50 MG PO TABS: 50.0000 mg | ORAL_TABLET | Freq: Four times a day (QID) | ORAL | Status: DC | PRN

## 2024-01-13 MED ORDER — SUGAMMADEX SODIUM 200 MG/2ML IV SOLN
INTRAVENOUS | Status: AC
  Filled 2024-01-13: qty 2

## 2024-01-13 MED ORDER — CHLORHEXIDINE GLUCONATE CLOTH 2 % EX PADS: 6.0000 | MEDICATED_PAD | Freq: Once | CUTANEOUS | Status: DC

## 2024-01-13 MED ORDER — MENTHOL 3 MG MT LOZG
1.0000 | LOZENGE | OROMUCOSAL | Status: DC | PRN
  Administered 2024-01-13: 3 mg via ORAL
  Filled 2024-01-13: qty 9

## 2024-01-13 MED ORDER — CALCIUM CARBONATE 1250 (500 CA) MG PO TABS
2.0000 | ORAL_TABLET | Freq: Three times a day (TID) | ORAL | Status: DC
  Administered 2024-01-13 – 2024-01-14 (×3): 2500 mg via ORAL
  Filled 2024-01-13 (×3): qty 2

## 2024-01-13 MED ORDER — ORAL CARE MOUTH RINSE: 15.0000 mL | Freq: Once | OROMUCOSAL | Status: AC

## 2024-01-13 MED ORDER — LIDOCAINE HCL (PF) 2 % IJ SOLN
INTRAMUSCULAR | Status: AC
  Filled 2024-01-13: qty 5

## 2024-01-13 MED ORDER — ROCURONIUM BROMIDE 10 MG/ML (PF) SYRINGE
PREFILLED_SYRINGE | INTRAVENOUS | Status: DC | PRN
  Administered 2024-01-13: 10 mg via INTRAVENOUS
  Administered 2024-01-13: 60 mg via INTRAVENOUS

## 2024-01-13 MED ORDER — CHLORHEXIDINE GLUCONATE 0.12 % MT SOLN
15.0000 mL | Freq: Once | OROMUCOSAL | Status: AC
  Administered 2024-01-13: 15 mL via OROMUCOSAL

## 2024-01-13 MED ORDER — ONDANSETRON 4 MG PO TBDP: 4.0000 mg | ORAL_TABLET | Freq: Four times a day (QID) | ORAL | Status: DC | PRN

## 2024-01-13 MED ORDER — SODIUM CHLORIDE 0.45 % IV SOLN
INTRAVENOUS | Status: DC
  Administered 2024-01-13: 50 mL/h via INTRAVENOUS

## 2024-01-13 MED ORDER — ACETAMINOPHEN 10 MG/ML IV SOLN
INTRAVENOUS | Status: AC
  Filled 2024-01-13: qty 100

## 2024-01-13 MED ORDER — PROPOFOL 10 MG/ML IV BOLUS
INTRAVENOUS | Status: DC | PRN
  Administered 2024-01-13: 150 mg via INTRAVENOUS

## 2024-01-13 MED ORDER — DEXAMETHASONE SOD PHOSPHATE PF 10 MG/ML IJ SOLN
INTRAMUSCULAR | Status: DC | PRN
  Administered 2024-01-13: 8 mg via INTRAVENOUS

## 2024-01-13 MED ORDER — ROCURONIUM BROMIDE 10 MG/ML (PF) SYRINGE
PREFILLED_SYRINGE | INTRAVENOUS | Status: AC
  Filled 2024-01-13: qty 10

## 2024-01-13 MED ORDER — FENTANYL CITRATE (PF) 100 MCG/2ML IJ SOLN
INTRAMUSCULAR | Status: DC | PRN
  Administered 2024-01-13: 25 ug via INTRAVENOUS
  Administered 2024-01-13: 100 ug via INTRAVENOUS
  Administered 2024-01-13 (×2): 25 ug via INTRAVENOUS

## 2024-01-13 MED ORDER — ONDANSETRON HCL 4 MG/2ML IJ SOLN
INTRAMUSCULAR | Status: DC | PRN
  Administered 2024-01-13: 4 mg via INTRAVENOUS

## 2024-01-13 MED ORDER — ACETAMINOPHEN 10 MG/ML IV SOLN: 1000.0000 mg | Freq: Once | INTRAVENOUS | Status: DC | PRN

## 2024-01-13 MED ORDER — ACETAMINOPHEN 650 MG RE SUPP: 650.0000 mg | Freq: Four times a day (QID) | RECTAL | Status: DC | PRN

## 2024-01-13 MED ORDER — HYDROMORPHONE HCL 1 MG/ML IJ SOLN: 1.0000 mg | INTRAMUSCULAR | Status: DC | PRN

## 2024-01-13 MED ORDER — CEFAZOLIN SODIUM-DEXTROSE 2-4 GM/100ML-% IV SOLN
2.0000 g | INTRAVENOUS | Status: AC
Start: 2024-01-13 — End: 2024-01-13
  Administered 2024-01-13: 2 g via INTRAVENOUS
  Filled 2024-01-13: qty 100

## 2024-01-13 MED ORDER — ONDANSETRON HCL 4 MG/2ML IJ SOLN: 4.0000 mg | Freq: Four times a day (QID) | INTRAMUSCULAR | Status: DC | PRN

## 2024-01-13 MED ORDER — SUGAMMADEX SODIUM 200 MG/2ML IV SOLN
INTRAVENOUS | Status: DC | PRN
  Administered 2024-01-13: 170 mg via INTRAVENOUS

## 2024-01-13 MED ORDER — LIDOCAINE HCL (PF) 2 % IJ SOLN
INTRAMUSCULAR | Status: DC | PRN
  Administered 2024-01-13: 60 mg via INTRADERMAL

## 2024-01-13 MED ORDER — ACETAMINOPHEN 325 MG PO TABS
650.0000 mg | ORAL_TABLET | Freq: Four times a day (QID) | ORAL | Status: DC | PRN
  Administered 2024-01-13: 650 mg via ORAL
  Filled 2024-01-13: qty 2

## 2024-01-13 MED ORDER — LACTATED RINGERS IV SOLN: INTRAVENOUS | Status: DC

## 2024-01-13 MED ORDER — FENTANYL CITRATE (PF) 50 MCG/ML IJ SOSY
25.0000 ug | PREFILLED_SYRINGE | INTRAMUSCULAR | Status: DC | PRN
  Administered 2024-01-13 (×2): 50 ug via INTRAVENOUS

## 2024-01-13 SURGICAL SUPPLY — 28 items
BAG COUNTER SPONGE SURGICOUNT (BAG) ×1 IMPLANT
BLADE SURG 15 STRL LF DISP TIS (BLADE) ×1 IMPLANT
CHLORAPREP W/TINT 26 (MISCELLANEOUS) ×1 IMPLANT
CLIP TI MEDIUM 6 (CLIP) ×2 IMPLANT
CLIP TI WIDE RED SMALL 6 (CLIP) ×2 IMPLANT
COVER SURGICAL LIGHT HANDLE (MISCELLANEOUS) ×1 IMPLANT
DERMABOND ADVANCED .7 DNX12 (GAUZE/BANDAGES/DRESSINGS) ×1 IMPLANT
DERMABOND ADVANCED .7 DNX6 (GAUZE/BANDAGES/DRESSINGS) IMPLANT
DRAPE LAPAROTOMY T 98X78 PEDS (DRAPES) ×1 IMPLANT
DRAPE UTILITY XL STRL (DRAPES) ×1 IMPLANT
ELECT PENCIL ROCKER SW 15FT (MISCELLANEOUS) ×1 IMPLANT
ELECT REM PT RETURN 15FT ADLT (MISCELLANEOUS) ×1 IMPLANT
GAUZE 4X4 16PLY ~~LOC~~+RFID DBL (SPONGE) ×1 IMPLANT
GLOVE SURG ORTHO 8.0 STRL STRW (GLOVE) ×1 IMPLANT
GOWN STRL REUS W/ TWL XL LVL3 (GOWN DISPOSABLE) ×2 IMPLANT
HEMOSTAT SURGICEL 2X4 FIBR (HEMOSTASIS) ×1 IMPLANT
ILLUMINATOR WAVEGUIDE N/F (MISCELLANEOUS) ×1 IMPLANT
KIT BASIN OR (CUSTOM PROCEDURE TRAY) ×1 IMPLANT
KIT TURNOVER KIT A (KITS) ×1 IMPLANT
PACK BASIC VI WITH GOWN DISP (CUSTOM PROCEDURE TRAY) ×1 IMPLANT
PAD MAGNETIC INSTR ST 16X20 (MISCELLANEOUS) ×1 IMPLANT
SHEARS HARMONIC 9CM CVD (BLADE) ×1 IMPLANT
SUT MNCRL AB 4-0 PS2 18 (SUTURE) ×1 IMPLANT
SUT SILK 3 0 SH 30 (SUTURE) ×1 IMPLANT
SUT VIC AB 3-0 SH 18 (SUTURE) ×2 IMPLANT
SYR BULB IRRIG 60ML STRL (SYRINGE) ×1 IMPLANT
TOWEL OR 17X26 10 PK STRL BLUE (TOWEL DISPOSABLE) ×1 IMPLANT
TUBING CONNECTING 10 (TUBING) ×1 IMPLANT

## 2024-01-13 NOTE — Interval H&P Note (Signed)
 History and Physical Interval Note:  01/13/2024 7:06 AM  John Spencer  has presented today for surgery, with the diagnosis of GRAVE'S DISEASE.  The various methods of treatment have been discussed with the patient and family. After consideration of risks, benefits and other options for treatment, the patient has consented to    Procedure(s): THYROIDECTOMY (N/A) as a surgical intervention.    The patient's history has been reviewed, patient examined, no change in status, stable for surgery.  I have reviewed the patient's chart and labs.  Questions were answered to the patient's satisfaction.    Krystal Spinner, MD [facility] practice Office: [phone]   Krystal Spinner

## 2024-01-13 NOTE — Plan of Care (Signed)

## 2024-01-13 NOTE — Anesthesia Postprocedure Evaluation (Signed)
 Anesthesia Post Note  Patient: John Spencer  Procedure(s) Performed: THYROIDECTOMY     Patient location during evaluation: PACU Anesthesia Type: General Level of consciousness: awake and alert Pain management: pain level controlled Vital Signs Assessment: post-procedure vital signs reviewed and stable Respiratory status: spontaneous breathing, nonlabored ventilation, respiratory function stable and patient connected to nasal cannula oxygen Cardiovascular status: blood pressure returned to baseline and stable Postop Assessment: no apparent nausea or vomiting Anesthetic complications: no   No notable events documented.  Last Vitals:  Vitals:   01/13/24 1030 01/13/24 1047  BP: (!) 162/75 (!) 164/70  Pulse: 87 86  Resp: (!) 21 18  Temp: 36.9 C 37.1 C  SpO2: 96% 93%    Last Pain:  Vitals:   01/13/24 1047  TempSrc: Oral  PainSc: Asleep                 Cordella SQUIBB Nevia Henkin

## 2024-01-13 NOTE — Anesthesia Procedure Notes (Signed)
 Procedure Name: Intubation Date/Time: 01/13/2024 7:38 AM  Performed by: Memory Armida LABOR, CRNAPre-anesthesia Checklist: Patient identified, Emergency Drugs available, Suction available, Patient being monitored and Timeout performed Patient Re-evaluated:Patient Re-evaluated prior to induction Oxygen Delivery Method: Circle system utilized Preoxygenation: Pre-oxygenation with 100% oxygen Induction Type: IV induction Ventilation: Mask ventilation without difficulty Laryngoscope Size: Mac and 4 Grade View: Grade I Tube type: Oral Tube size: 7.5 mm Number of attempts: 1 Airway Equipment and Method: Stylet Placement Confirmation: ETT inserted through vocal cords under direct vision, positive ETCO2 and breath sounds checked- equal and bilateral Secured at: 21 cm Tube secured with: Tape Dental Injury: Teeth and Oropharynx as per pre-operative assessment

## 2024-01-13 NOTE — Transfer of Care (Signed)
 Immediate Anesthesia Transfer of Care Note  Patient: John Spencer  Procedure(s) Performed: THYROIDECTOMY  Patient Location: PACU  Anesthesia Type:General  Level of Consciousness: awake, alert , oriented, and patient cooperative  Airway & Oxygen Therapy: Patient Spontanous Breathing and Patient connected to face mask oxygen  Post-op Assessment: Report given to RN, Post -op Vital signs reviewed and stable, and Patient moving all extremities  Post vital signs: Reviewed and stable  Last Vitals:  Vitals Value Taken Time  BP 171/83 01/13/24 09:35  Temp    Pulse 86 01/13/24 09:36  Resp 19 01/13/24 09:36  SpO2 100 % 01/13/24 09:36  Vitals shown include unfiled device data.  Last Pain:  Vitals:   01/13/24 0551  TempSrc: Oral  PainSc:          Complications: No notable events documented.

## 2024-01-13 NOTE — Op Note (Signed)
 Procedure Note  Pre-operative Diagnosis:  Graves' disease  Post-operative Diagnosis:  same  Surgeon:  Krystal Spinner, MD  Assistant:  none   Procedure:  Total thyroidectomy  Anesthesia:  General  Estimated Blood Loss:  50 cc  Drains: none         Specimen: thyroid  to pathology  Indications:  Patient is referred by Dr. Reyes Alexander from endocrinology for surgical evaluation and management of newly diagnosed Graves' disease. Patient's primary care provider is Charmaine Bright, [place]. Patient first noted weight loss approximately 3 to 4 months ago. He was evaluated and found to have an undetectable TSH level. Additional testing demonstrated evidence of Graves' disease, including a nuclear medicine thyroid  scan in early September. Patient has noted visual changes. He complains of blurred vision. He complains of eye discomfort. He has had problems with double vision. He has been seen by his eye care professional. 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 Patient was placed on methimazole but this was poorly tolerated. He is currently on prednisone. Patient presents today to discuss thyroid  surgery for treatment of Graves' disease. Patient does have a family history of thyroid  disease and his daughter and his nephew, both of whom had some form of malignancy. Patient has had no prior thyroid  surgery. 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.   Procedure Details: Procedure was done in OR #4 at the [facility]. The patient was brought to the operating room and placed in a supine position on the operating room table. Following administration of general anesthesia, the patient was positioned and then prepped and draped in the usual aseptic fashion. After ascertaining that an adequate level of anesthesia had been achieved, a small Kocher incision was made with #15 blade. Dissection was carried through subcutaneous tissues and platysma.Hemostasis was achieved with the  electrocautery. Skin flaps were elevated cephalad and caudad from the thyroid  notch to the sternal notch. A Mahorner self-retaining retractor was placed for exposure. Strap muscles were incised in the midline and dissection was begun on the left side.  Strap muscles were reflected laterally.  Left thyroid  lobe was normal in size without nodules.  The left lobe was gently mobilized with blunt dissection. Superior pole vessels were dissected out and divided individually between small and medium ligaclips with the harmonic scalpel. The thyroid  lobe was rolled anteriorly. Branches of the inferior thyroid  artery were divided between small ligaclips with the harmonic scalpel. Inferior venous tributaries were divided between ligaclips. Both the superior and inferior parathyroid glands were identified and preserved on their vascular pedicles. The recurrent laryngeal nerve was identified and preserved along its course. The ligament of Court was released with the electrocautery and the gland was mobilized onto the anterior trachea. Isthmus was mobilized across the midline. There was no significant pyramidal lobe present. Dry pack was placed in the left neck.  The right thyroid  lobe was gently mobilized with blunt dissection. Right thyroid  lobe was normal in size without nodules. Superior pole vessels were dissected out and divided between small and medium ligaclips with the Harmonic scalpel. Superior parathyroid was identified and preserved. Inferior venous tributaries were divided between medium ligaclips with the harmonic scalpel. The right thyroid  lobe was rolled anteriorly and the branches of the inferior thyroid  artery divided between small ligaclips. The right recurrent laryngeal nerve was identified and preserved along its course. The ligament of Court was released with the electrocautery. The right thyroid  lobe was mobilized onto the anterior trachea and the  remainder of the thyroid  was dissected off the anterior  trachea and the thyroid  was completely excised. A suture was used to mark the left lobe. The entire thyroid  gland was submitted to pathology for review.  Palpation of the operative field demonstrated no evidence of residual disease and no abnormal lymph nodes.  The neck was irrigated with warm saline. Fibrillar was placed throughout the operative field. Strap muscles were approximated in the midline with interrupted 3-0 Vicryl sutures. Platysma was closed with interrupted 3-0 Vicryl sutures. Skin was closed with a running 4-0 Monocryl subcuticular suture. Wound was washed and Dermabond was applied. The patient was awakened from anesthesia and brought to the recovery room. The patient tolerated the procedure well.   Krystal Spinner, MD [facility] Surgery Office: [phone]

## 2024-01-13 NOTE — Addendum Note (Signed)
 Addendum  created 01/13/24 1227 by Memory Armida LABOR, CRNA   Flowsheet accepted

## 2024-01-13 NOTE — Anesthesia Preprocedure Evaluation (Signed)
 Anesthesia Evaluation  Patient identified by MRN, date of birth, ID band Patient awake    Reviewed: Allergy & Precautions, NPO status , Patient's Chart, lab work & pertinent test results  Airway Mallampati: II  TM Distance: >3 FB Neck ROM: Full    Dental no notable dental hx.    Pulmonary neg pulmonary ROS, former smoker   Pulmonary exam normal        Cardiovascular + CAD and + Past MI   Rhythm:Regular Rate:Normal     Neuro/Psych    Depression    negative neurological ROS     GI/Hepatic negative GI ROS, Neg liver ROS,,,  Endo/Other  negative endocrine ROS    Renal/GU negative Renal ROS  negative genitourinary   Musculoskeletal  (+) Arthritis , Osteoarthritis,    Abdominal Normal abdominal exam  (+)   Peds  Hematology Lab Results      Component                Value               Date                      WBC                      6.4                 01/05/2024                HGB                      13.5                01/05/2024                HCT                      42.3                01/05/2024                MCV                      96.8                01/05/2024                PLT                      226                 01/05/2024             Lab Results      Component                Value               Date                      NA                       141                 01/05/2024                K  3.9                 01/05/2024                CO2                      25                  01/05/2024                GLUCOSE                  97                  01/05/2024                BUN                      11                  01/05/2024                CREATININE               0.88                01/05/2024                CALCIUM                   9.2                 01/05/2024                GFRNONAA                 >60                 01/05/2024              Anesthesia Other  Findings   Reproductive/Obstetrics                              Anesthesia Physical Anesthesia Plan  ASA: 2  Anesthesia Plan: General   Post-op Pain Management:    Induction: Intravenous  PONV Risk Score and Plan: 2 and Ondansetron , Dexamethasone  and Treatment may vary due to age or medical condition  Airway Management Planned: Mask and Oral ETT  Additional Equipment: None  Intra-op Plan:   Post-operative Plan: Extubation in OR  Informed Consent: I have reviewed the patients History and Physical, chart, labs and discussed the procedure including the risks, benefits and alternatives for the proposed anesthesia with the patient or authorized representative who has indicated his/her understanding and acceptance.     Dental advisory given  Plan Discussed with: CRNA  Anesthesia Plan Comments:         Anesthesia Quick Evaluation

## 2024-01-14 ENCOUNTER — Ambulatory Visit: Payer: Self-pay | Admitting: Surgery

## 2024-01-14 ENCOUNTER — Encounter (HOSPITAL_COMMUNITY): Payer: Self-pay | Admitting: Surgery

## 2024-01-14 DIAGNOSIS — E0500 Thyrotoxicosis with diffuse goiter without thyrotoxic crisis or storm: Secondary | ICD-10-CM | POA: Diagnosis not present

## 2024-01-14 LAB — CALCIUM: Calcium: 9.2 mg/dL (ref 8.9–10.3)

## 2024-01-14 LAB — SURGICAL PATHOLOGY

## 2024-01-14 MED ORDER — CALCIUM CARBONATE ANTACID 500 MG PO CHEW: 2.0000 | CHEWABLE_TABLET | Freq: Two times a day (BID) | ORAL | 1 refills | Status: AC

## 2024-01-14 MED ORDER — TRAMADOL HCL 50 MG PO TABS: 50.0000 mg | ORAL_TABLET | Freq: Four times a day (QID) | ORAL | 0 refills | Status: AC | PRN

## 2024-01-14 NOTE — Progress Notes (Signed)
 Discharge instructions given to patient and all questions were answered.

## 2024-01-14 NOTE — Plan of Care (Signed)
  Problem: Coping: Goal: Level of anxiety will decrease Outcome: Progressing   Problem: Pain Managment: Goal: General experience of comfort will improve and/or be controlled Outcome: Progressing   Problem: Education: Goal: Knowledge of General Education information will improve Description: Including pain rating scale, medication(s)/side effects and non-pharmacologic comfort measures Outcome: Progressing

## 2024-01-14 NOTE — Progress Notes (Signed)
   01/14/24 1011  TOC Brief Assessment  Insurance and Status Reviewed  Patient has primary care physician Yes  Home environment has been reviewed resies in an apartment  Prior level of function: Independent  Prior/Current Home Services No current home services  Social Drivers of Health Review SDOH reviewed no interventions necessary  Readmission risk has been reviewed Yes  Transition of care needs no transition of care needs at this time

## 2024-01-14 NOTE — Discharge Summary (Signed)
    Physician Discharge Summary   Patient ID: John Spencer MRN: [id number] DOB/AGE: 74/06/1950 73 y.o.  Admit date: 01/13/2024  Discharge date: 01/14/2024  Discharge Diagnoses:  Principal Problem:   Graves' disease Active Problems:   Graves disease   Discharged Condition: good  Hospital Course: Patient was admitted for observation following total thyroidectomy.  Post op course was uncomplicated.  Pain was well controlled.  Tolerated diet.  Post op calcium  level on morning following surgery was 9.2 mg/dl.  Patient was prepared for discharge home on POD#1.  Consults: None  Treatments: surgery: total thyroidectomy  Discharge Exam: Blood pressure (!) 149/63, pulse 66, temperature 97.8 F (36.6 C), temperature source Oral, resp. rate 16, height 5' 11.5 (1.816 m), weight 68 kg, SpO2 96%. HEENT - clear Neck - wound dry and intact; mild STS; voice normal; Dermabond in place  Disposition: Home  Discharge Instructions     Diet - low sodium heart healthy   Complete by: As directed    Increase activity slowly   Complete by: As directed    No dressing needed   Complete by: As directed       Allergies as of 01/14/2024       Reactions   Finasteride  Nausea Only, Other (See Comments)   Other Reaction(s): blurred vision, nausea, depression   Methimazole Other (See Comments)        Medication List     TAKE these medications    calcium  carbonate 500 MG chewable tablet Commonly known as: Tums Chew 2 tablets (400 mg of elemental calcium  total) by mouth 2 (two) times daily.   cholecalciferol 25 MCG (1000 UNIT) tablet Commonly known as: VITAMIN D3 Take 1,000 Units by mouth daily.   traMADol  50 MG tablet Commonly known as: ULTRAM  Take 1-2 tablets (50-100 mg total) by mouth every 6 (six) hours as needed.   Vitamin B12 1000 MCG Tabs Take 1,000 mcg by mouth daily.               Discharge Care Instructions  (From admission, onward)           Start      Ordered   01/14/24 0000  No dressing needed        01/14/24 9087            Follow-up Information     Eletha Boas, MD. Schedule an appointment as soon as possible for a visit in 3 week(s).   Specialty: General Surgery Why: For wound re-check Contact information: [address] [place] [address] [phone]                 Boas Eletha, MD [facility] Office: [phone]   Signed: Boas Eletha 01/14/2024, 9:22 AM

## 2024-01-14 NOTE — Progress Notes (Signed)
 Final pathology results are benign.  Good news.  Krystal Spinner, MD [facility] practice Office: [phone]

## 2024-01-14 NOTE — Discharge Instructions (Signed)
